# Patient Record
Sex: Female | Born: 1940 | Race: White | Hispanic: No | Marital: Married | Smoking: Never smoker
Health system: Southern US, Community
[De-identification: ages and names within clinical notes are randomized; demographics above are authoritative.]

## PROBLEM LIST (undated history)

## (undated) DIAGNOSIS — J984 Other disorders of lung: Secondary | ICD-10-CM

## (undated) DIAGNOSIS — F329 Major depressive disorder, single episode, unspecified: Secondary | ICD-10-CM

## (undated) DIAGNOSIS — K5909 Other constipation: Secondary | ICD-10-CM

## (undated) DIAGNOSIS — M1990 Unspecified osteoarthritis, unspecified site: Secondary | ICD-10-CM

## (undated) DIAGNOSIS — E119 Type 2 diabetes mellitus without complications: Secondary | ICD-10-CM

## (undated) DIAGNOSIS — I471 Supraventricular tachycardia: Secondary | ICD-10-CM

## (undated) DIAGNOSIS — T7840XA Allergy, unspecified, initial encounter: Secondary | ICD-10-CM

## (undated) DIAGNOSIS — R931 Abnormal findings on diagnostic imaging of heart and coronary circulation: Secondary | ICD-10-CM

## (undated) DIAGNOSIS — Z853 Personal history of malignant neoplasm of breast: Secondary | ICD-10-CM

## (undated) DIAGNOSIS — F32A Depression, unspecified: Secondary | ICD-10-CM

## (undated) DIAGNOSIS — I10 Essential (primary) hypertension: Secondary | ICD-10-CM

## (undated) DIAGNOSIS — M069 Rheumatoid arthritis, unspecified: Secondary | ICD-10-CM

## (undated) DIAGNOSIS — I4719 Other supraventricular tachycardia: Secondary | ICD-10-CM

## (undated) DIAGNOSIS — G20A1 Parkinson's disease without dyskinesia, without mention of fluctuations: Secondary | ICD-10-CM

## (undated) DIAGNOSIS — E785 Hyperlipidemia, unspecified: Secondary | ICD-10-CM

## (undated) HISTORY — DX: Other supraventricular tachycardia: I47.19

## (undated) HISTORY — PX: ACHILLES TENDON REPAIR: SUR1153

## (undated) HISTORY — PX: TONSILLECTOMY AND ADENOIDECTOMY: SHX28

## (undated) HISTORY — PX: ABDOMINAL HYSTERECTOMY: SHX81

## (undated) HISTORY — DX: Other disorders of lung: J98.4

## (undated) HISTORY — DX: Rheumatoid arthritis, unspecified: M06.9

## (undated) HISTORY — DX: Supraventricular tachycardia: I47.1

## (undated) HISTORY — DX: Allergy, unspecified, initial encounter: T78.40XA

## (undated) HISTORY — PX: CATARACT EXTRACTION: SUR2

## (undated) HISTORY — DX: Other constipation: K59.09

## (undated) HISTORY — DX: Unspecified osteoarthritis, unspecified site: M19.90

## (undated) HISTORY — DX: Essential (primary) hypertension: I10

## (undated) HISTORY — PX: OTHER SURGICAL HISTORY: SHX169

## (undated) HISTORY — DX: Type 2 diabetes mellitus without complications: E11.9

## (undated) HISTORY — DX: Depression, unspecified: F32.A

## (undated) HISTORY — DX: Abnormal findings on diagnostic imaging of heart and coronary circulation: R93.1

## (undated) HISTORY — DX: Personal history of malignant neoplasm of breast: Z85.3

## (undated) HISTORY — PX: BREAST REDUCTION SURGERY: SHX8

## (undated) HISTORY — DX: Hyperlipidemia, unspecified: E78.5

---
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
## 1999-01-12 ENCOUNTER — Other Ambulatory Visit: Admission: RE | Admit: 1999-01-12 | Discharge: 1999-01-12 | Payer: Self-pay | Admitting: Internal Medicine

## 2000-08-17 ENCOUNTER — Other Ambulatory Visit: Admission: RE | Admit: 2000-08-17 | Discharge: 2000-08-17 | Payer: Self-pay | Admitting: Plastic Surgery

## 2004-11-23 ENCOUNTER — Encounter: Admission: RE | Admit: 2004-11-23 | Discharge: 2005-02-16 | Payer: Self-pay

## 2006-06-15 ENCOUNTER — Ambulatory Visit (HOSPITAL_COMMUNITY): Admission: RE | Admit: 2006-06-15 | Discharge: 2006-06-15 | Payer: Self-pay | Admitting: *Deleted

## 2010-12-25 NOTE — Op Note (Signed)
NAMEJANNINE, Christy [name]               ACCOUNT NO.:  [id number]   MEDICAL RECORD NO.:  [id number]          PATIENT TYPE:  AMB   LOCATION:  ENDO                         FACILITY:  [facility]   PHYSICIAN:  Georgiana Spinner, M.D.    DATE OF BIRTH:  Jan 20, 1941   DATE OF PROCEDURE:  06/15/2006  DATE OF DISCHARGE:                                 OPERATIVE REPORT   PROCEDURE:  Colonoscopy.   INDICATIONS:  Rectal bleeding.   ANESTHESIA:  Demerol 100 mg and Versed 10 mg.   PROCEDURE:  With the patient mildly sedated in the left lateral decubitus  position, the Olympus videoscopic colonoscope was inserted in the rectum and  passed under direct vision with pressure applied and the patient rolled to  her back and subsequently to the right lateral decubitus position.  We  reached the cecum identified by ileocecal valve and appendiceal orifice both  of which were photographed.  From this point the colonoscope was slowly  withdrawn taking circumferential views of colonic mucosa stopping only in  the rectum which appeared normal.  The rectum showed hemorrhoids on  retroflex view.  The endoscope was straightened and withdrawn.  The  patient's vital signs and pulse oximeter remained stable.  The patient  tolerated the procedure well without apparent complications.   FINDINGS:  1. Scattered diverticulosis throughout the colon, both right and left      colon.  2. Internal hemorrhoids.  3. Otherwise an unremarkable colonoscopic examination to the cecum.   PLAN:  Consider repeat examination in 5-10 years           ______________________________  Georgiana Spinner, M.D.     GMO/MEDQ  D:  06/15/2006  T:  06/15/2006  Job:  [id number]

## 2011-03-22 ENCOUNTER — Encounter: Payer: Medicare Other | Attending: Internal Medicine | Admitting: Dietician

## 2011-03-22 ENCOUNTER — Encounter: Payer: Self-pay | Admitting: Dietician

## 2011-03-22 DIAGNOSIS — Z713 Dietary counseling and surveillance: Secondary | ICD-10-CM | POA: Insufficient documentation

## 2011-03-22 DIAGNOSIS — E119 Type 2 diabetes mellitus without complications: Secondary | ICD-10-CM | POA: Insufficient documentation

## 2011-03-22 NOTE — Patient Instructions (Signed)
   Check your feet daily and make sure they are healthy.  B:  Make sure you have 14 gm of protein   Keep the carb level at 20-30 gm at breakfast.   L=14 gm protein, keep the carb at 30 gms.  Afternoon snack keep protein at 7-14 gm protein and 15 gms of carb.    If you continue to have low blood glucose levels and need to treat, call Judie Grieve and see about taking the Metformin dose down to 250 mg instead of 500 mg.   Aim for bedtime blood glucose level of 100-120. Marland Kitchen  Dinner carb at 30 gm level and protein 21+ gms at dinner.    Bedtime snack carb at 15 gms and 14 gm protein   Keep a glucose diary and plot the glucose levels and the lows.    If having issues/questions, call at [phone] [facility] or e-mail)  See my card.

## 2011-03-22 NOTE — Progress Notes (Signed)
  Medical Nutrition Therapy:  Appt start time: 1600 end time:  1700.   Assessment:  Primary concerns today: Concerned with the episodes of low blood glucose and wishes to loose more weight and to keep it off.  Currently on Bydureon and Metformin.  Metformin dose was decreased to 500 mg PC breakfast and dinner.  Continues to have lows and needs to have the afternoon snack.  Krishan Mcbreen be able to cut the AM dose of Metformin to 250 mg. Has over time lost about 30 lbs.  Has gained 7 of those back.  Gives a history of gluten intolerance.  Generally avoids gluten.  When eaten, the gluten causes her feet to swell.  MEDICATIONS: Bydureon, Metformin, Methotrexate, Folic Acid One-A-Day vitamins, Ultimate Omega , Vitamin D 3, Calcium, Selenium, Zyflamend 1-2 caps daily for inflammation of the arthritis, Methyl-B 12, Psyllium.   DIETARY INTAKE:  Usual eating pattern includes 3 meals and 2 snacks per day.  Everyday foods include: Protein, fruit, vegetable .  Avoided foods include: foods containing gluten.    24-hr recall:  B (100 AM): [place] yogurt 1/2 cup (no sugar added), raspberries (handful), granola 2-3 TBSP.  Snk ( AM): No AM Snack. Carb= about 30 gm. L (12:00-1:00 PM): Whey Protein Shake (Protein Powder in 8 oz of skim milk with 1/2 banana and 6 frozen strawberries. Carb=approx. 34 gm. Snk (3:00-3:30 PM): 3 fig bar cookies, 8 oz skim milk. Carb = approx. 42-45 gm. D (6:00-6:30PM): Chicken burger, lettuce, tomato, mayo on whole grain bun, Zucchini with onion, sliced cucumbers, Iced tea (unsweetened)= approx. 30-45 gm. Snk (9>00PM): 1/2 cup frozen [place] yogurt with almonds Carb= approx. 15 gm.  Usual physical activity: Aerobic tape using weights 3 days per week for 30-40 minutes.  Tue. And Thur. To the [facility] for 60 minutes of water aerobics.  On Saturday, Candon Caras ride bike, or walk in the neighborhood for 30-45 minutes.  Estimated energy needs: 1300  calories 130-135 g carbohydrates 110-115 g protein 34-36 g  fat  Progress Towards Goal(s):  In progress.   Nutritional Diagnosis:  [place]-3.3 Overweight/obesity As related to weight of 170 lbs for height of 64 inches..  As evidenced by a BMI of 29.2%..    Intervention:  Nutrition : To keep a food diary, counting calories and carbohydrates.  To aim for 1300 calories per day and not less than 1200/day.  To count carbohydrates.  Try to keep at 30 gm per meal and 15 gm for the afternoon and evening snack.  To keep a close watch on blood glucose levels as well.  Zayde Stroupe need to decrease the AM dose of Metformin as she is complaining that the lunch is not holding her and she needs the afternoon snack.  To call or e-mail if problems occur.  To follow-up in 8 weeks.  Monitoring/Evaluation:  Dietary intake, exercise, blood glucose monitoring  and body weight and calorie, carbohydrate diary and follow-up in 8 weeks.Marland Kitchen

## 2016-05-06 LAB — HM DEXA SCAN

## 2017-12-01 LAB — HEPATIC FUNCTION PANEL
ALT: 10 (ref 7–35)
AST: 12 — AB (ref 13–35)
Alkaline Phosphatase: 70 (ref 25–125)
Bilirubin, Total: 0.3

## 2017-12-01 LAB — HEMOGLOBIN A1C: Hemoglobin A1C: 6.2

## 2017-12-02 LAB — BASIC METABOLIC PANEL
BUN: 20 (ref 4–21)
Creatinine: 1.1 (ref 0.5–1.1)
Glucose: 106
Potassium: 4.2 (ref 3.4–5.3)
Sodium: 142 (ref 137–147)

## 2018-11-01 ENCOUNTER — Ambulatory Visit: Payer: BC Managed Care – PPO | Admitting: Family Medicine

## 2019-01-31 ENCOUNTER — Ambulatory Visit: Payer: BC Managed Care – PPO | Admitting: Family Medicine

## 2019-02-21 ENCOUNTER — Telehealth: Payer: Self-pay

## 2019-02-21 NOTE — Telephone Encounter (Signed)
Questions for Screening COVID-19  Symptom onset: no  Travel or Contacts: n/a  During this illness, did/does the patient experience any of the following symptoms? Fever >100.22F []   Yes [x]   No []   Unknown Subjective fever (felt feverish) []   Yes [x]   No []   Unknown Chills []   Yes [x]   No []   Unknown Muscle aches (myalgia) []   Yes [x]   No []   Unknown Runny nose (rhinorrhea) []   Yes [x]   No []   Unknown Sore throat []   Yes [x]   No []   Unknown Cough (new onset or worsening of chronic cough) []   Yes [x]   No []   Unknown Shortness of breath (dyspnea) []   Yes [x]   No []   Unknown Nausea or vomiting []   Yes [x]   No []   Unknown Headache []   Yes [x]   No []   Unknown Abdominal pain  []   Yes [x]   No []   Unknown Diarrhea (?3 loose/looser than normal stools/24hr period) []   Yes [x]   No []   Unknown Other, specify:  Patient risk factors: Smoker? []   Current []   Former []   Never If female, currently pregnant? []   Yes []   No  There are no active problems to display for this patient.   Plan:  []   High risk for COVID-19 with red flags go to ED (with CP, SOB, weak/lightheaded, or fever > 101.5). Call ahead.  []   High risk for COVID-19 but stable. Inform provider and coordinate time for [facility] visit.   []   No red flags but URI signs or symptoms okay for [facility] visit.

## 2019-02-22 ENCOUNTER — Ambulatory Visit (INDEPENDENT_AMBULATORY_CARE_PROVIDER_SITE_OTHER): Payer: Medicare Other | Admitting: Family Medicine

## 2019-02-22 ENCOUNTER — Encounter: Payer: Self-pay | Admitting: Family Medicine

## 2019-02-22 VITALS — BP 156/72 | HR 87 | Temp 98.7°F | Ht 64.0 in | Wt 179.2 lb

## 2019-02-22 DIAGNOSIS — L299 Pruritus, unspecified: Secondary | ICD-10-CM

## 2019-02-22 DIAGNOSIS — Z7689 Persons encountering health services in other specified circumstances: Secondary | ICD-10-CM

## 2019-02-22 DIAGNOSIS — G479 Sleep disorder, unspecified: Secondary | ICD-10-CM

## 2019-02-22 NOTE — Patient Instructions (Addendum)
Amitriptyline  Melatonin 5-10mg  30-86min prior to bedtime and take consistently  Stop protein shake Consider daily zyrtec 1 tab x 2-4 wks

## 2019-02-22 NOTE — Progress Notes (Signed)
Christy [name] is a 78 y.o. female  Chief Complaint  Patient presents with  . Establish Care    est care/ having sleep issues/ pt states she is itching mainly arms and neck and feels flushed/ wants to talk about supplements see if corresponding to itching/ diet / pt getting records for shots.    HPI: Christy [name] is a 78 y.o. female to establish care with our office. She has 2 grown sons and a 12yo grandson. She is married (2nd marriage) x 10 years. She is a retired Pharmacist, community.   Specialists: Rheum (Dr. Mallie Mussel), ophthalmology (Dr. Hazle Quant)  Last CPE, labs: 05/2018  Last PAP: s/p TAH Last mammo: 01/2019 Last Dexa: 2-3 years ago - osteopenia Last colonoscopy: 2012 - due in 2020  She notes issues with sleep x a few years. She states she wakes up every few hours.  She tried [place] in the past but stopped d/t side effects - too drowsy in AM. She also tried tramadol but did not feel it was effective. She is taking melatonin on/off x >6-47mo but consistently. She is unsure of dosage but thinks it is 2mg .   She complains of itching on/off worse in the AM but still present as day goes on. Symptoms x 4-5 mo. Pt states she feels "flushed" and then itchy. Mostly arms and neck. No rash. She does have seasonal allergies.  No new meds, supplements, lotions, detergents, etc. She started a diet called 56 Days in late 09/2018 and is drinking a protein shake with "lots of ingredients". She states the timing coincides with start of above symptoms. She has not tried taking anything.   Past Medical History:  Diagnosis Date  . Arthritis     History reviewed. No pertinent surgical history.  Social History   Socioeconomic History  . Marital status: Divorced    Spouse name: Not on file  . Number of children: Not on file  . Years of education: Not on file  . Highest education level: Not on file  Occupational History  . Not on file  Social Needs  . Financial resource strain: Not on file   . Food insecurity    Worry: Not on file    Inability: Not on file  . Transportation needs    Medical: Not on file    Non-medical: Not on file  Tobacco Use  . Smoking status: Never Smoker  . Smokeless tobacco: Never Used  Substance and Sexual Activity  . Alcohol use: Not Currently  . Drug use: Not Currently  . Sexual activity: Not on file  Lifestyle  . Physical activity    Days per week: Not on file    Minutes per session: Not on file  . Stress: Not on file  Relationships  . Social 10/2018 on phone: Not on file    Gets together: Not on file    Attends religious service: Not on file    Active member of club or organization: Not on file    Attends meetings of clubs or organizations: Not on file    Relationship status: Not on file  . Intimate partner violence    Fear of current or ex partner: Not on file    Emotionally abused: Not on file    Physically abused: Not on file    Forced sexual activity: Not on file  Other Topics Concern  . Not on file  Social History Narrative  . Not on file  History reviewed. No pertinent family history.   Immunization History  Administered Date(s) Administered  . Influenza Split 06/13/2015  . Pneumococcal Polysaccharide-23 06/01/2006    Outpatient Encounter Medications as of 02/22/2019  Medication Sig  . Calcium Citrate 150 MG CAPS Take 1,000 mg by mouth daily.    . Cholecalciferol (VITAMIN D) 1000 UNITS capsule Take 1,000 Units by mouth daily.    . Cyanocobalamin (VITAMIN B 12 PO) Take 1,000 mcg by mouth daily.    . Exenatide (BYDUREON) 2 MG SUSR Inject 2 mg into the skin once a week.    . folic acid (FOLVITE) 1 MG tablet Take 1 mg by mouth daily.    . metFORMIN (GLUCOPHAGE) 500 MG tablet Take 500 mg by mouth 2 (two) times daily with a meal.    . methotrexate (RHEUMATREX) 2.5 MG tablet Take 2.5 mg by mouth. Caution:Chemotherapy. Protect from light.   . MULTIPLE VITAMIN PO Take 1 tablet by mouth daily.    . PSYLLIUM PO  Take 8-10 capsules by mouth daily.    . Selenium 200 MCG CAPS Take 200 mcg by mouth daily.    . TRULICITY 0.09 FG/1.8EX SOPN INJ 0.75 MG [place] Q WK   No facility-administered encounter medications on file as of 02/22/2019.      ROS: Pertinent positives and negatives noted in HPI. Remainder of ROS non-contributory  Allergies  Allergen Reactions  . Epinephrine   . Erythromycin   . Gluten Swelling    Feet will swell.  Marland Kitchen Penicillins   . Tetracyclines & Related     BP (!) 156/72   Pulse 87   Temp 98.7 F (37.1 C) (Oral)   Ht 5\' 4"  (1.626 m)   Wt 179 lb 3.2 oz (81.3 kg)   SpO2 97%   BMI 30.76 kg/m   BP Readings from Last 3 Encounters:  02/22/19 (!) 156/72    Physical Exam  Constitutional: She is oriented to person, place, and time. She appears well-developed and well-nourished. No distress.  Neck: Neck supple.  Cardiovascular: Normal rate and regular rhythm.  Pulmonary/Chest: Effort normal and breath sounds normal.  Musculoskeletal: Normal range of motion.        General: No edema.  Lymphadenopathy:    She has no cervical adenopathy.  Neurological: She is alert and oriented to person, place, and time.  Skin: Skin is warm and dry. No rash noted. No erythema.  Psychiatric: She has a normal mood and affect. Her behavior is normal.     A/P:  1. Encounter to establish care with new doctor - due in 05/2019 for CPE, labs - due for Dexa but pt declines referral today  2. Sleep disturbance - pt has tried OTC meds as well as trazodone, ambien - currently taking melatonin 2mg  PRN but does not feel it is effective. Recommended pt take 5mg  tab qHS consistently x 2-4 wks - consider amitriptyline if no improvement with melatonin   3. Pruritus - worse in AM, symptoms x 4-37mo - onset of symptoms correlate with pt starting new daily protein powder/shake as part of a weight loss program. Advised pt to stop this to see if symptoms improve. - if no improvement with stopping of protein  shake, pt will take zyrtec 1 tab daily x 2-4 wks - f/u if no improvement with above and will check labs including LFTs

## 2019-02-23 ENCOUNTER — Encounter: Payer: Self-pay | Admitting: Family Medicine

## 2019-02-26 LAB — LIPID PANEL
Cholesterol: 184 (ref 0–200)
HDL: 80 — AB (ref 35–70)
LDL Cholesterol: 84
Triglycerides: 100 (ref 40–160)

## 2019-02-26 LAB — CBC AND DIFFERENTIAL
HCT: 34 — AB (ref 36–46)
Hemoglobin: 11.6 — AB (ref 12.0–16.0)
Neutrophils Absolute: 4
Platelets: 268 (ref 150–399)
WBC: 6.9

## 2019-02-26 LAB — BASIC METABOLIC PANEL
Glucose: 121
Potassium: 4.1 (ref 3.4–5.3)
Sodium: 140 (ref 137–147)

## 2019-02-26 LAB — HEPATIC FUNCTION PANEL
ALT: 12 (ref 7–35)
AST: 14 (ref 13–35)

## 2019-02-26 LAB — HEMOGLOBIN A1C: Hemoglobin A1C: 6.8

## 2019-02-26 LAB — VITAMIN D 25 HYDROXY (VIT D DEFICIENCY, FRACTURES): Vit D, 25-Hydroxy: 53.7

## 2019-03-06 ENCOUNTER — Encounter: Payer: Self-pay | Admitting: Family Medicine

## 2019-06-12 ENCOUNTER — Ambulatory Visit: Payer: Medicare Other | Admitting: Psychology

## 2019-06-21 ENCOUNTER — Telehealth: Payer: Self-pay

## 2019-06-21 NOTE — Telephone Encounter (Signed)
Copied from [place] [id number]. Topic: Appointment Scheduling - Scheduling Inquiry for Clinic >> Jun 21, 2019  1:53 PM Christy [name] wrote: Patient insisted we send message in regards to her wanting to transfer her care to Dr. Deborra Medina. Please advise

## 2019-07-11 ENCOUNTER — Ambulatory Visit: Payer: TRICARE For Life (TFL) | Admitting: Family Medicine

## 2019-08-29 ENCOUNTER — Ambulatory Visit: Payer: Medicare PPO | Attending: Internal Medicine

## 2019-08-29 DIAGNOSIS — Z23 Encounter for immunization: Secondary | ICD-10-CM | POA: Insufficient documentation

## 2019-09-13 ENCOUNTER — Telehealth: Payer: Self-pay | Admitting: Family Medicine

## 2019-09-13 NOTE — Telephone Encounter (Signed)
Patient called to reschedule her COVID-19 immunization.  She developed a rash and spoke with pfizer. They recommended delay of second dose 1 week. Appointment was rescheduled.  She was cautioned that this appointment could change due to availability of vaccine.

## 2019-09-17 ENCOUNTER — Ambulatory Visit: Payer: Self-pay

## 2019-09-25 ENCOUNTER — Ambulatory Visit: Payer: Medicare PPO

## 2019-09-28 ENCOUNTER — Ambulatory Visit: Payer: Self-pay

## 2019-09-30 ENCOUNTER — Ambulatory Visit: Payer: Medicare PPO | Attending: Internal Medicine

## 2019-09-30 DIAGNOSIS — Z23 Encounter for immunization: Secondary | ICD-10-CM | POA: Insufficient documentation

## 2019-09-30 NOTE — Progress Notes (Signed)
   Covid-19 Vaccination Clinic  Name:  Christy [name]    MRN: [id number] DOB: 02-14-1941  09/30/2019  Christy [name] was observed post Covid-19 immunization for 30 minutes based on pre-vaccination screening without incidence. She was provided with Vaccine Information Sheet and instruction to access the V-Safe system.   Christy [name] was instructed to call 911 with any severe reactions post vaccine: Marland Kitchen Difficulty breathing  . Swelling of your face and throat  . A fast heartbeat  . A bad rash all over your body  . Dizziness and weakness    Immunizations Administered    Name Date Dose VIS Date Route   Pfizer COVID-19 Vaccine 09/30/2019  9:50 AM 0.3 mL 07/20/2019 Intramuscular   Manufacturer: [employer], [employer]   Lot: [id number]   NDC: [address]-0

## 2019-10-17 DIAGNOSIS — R011 Cardiac murmur, unspecified: Secondary | ICD-10-CM | POA: Diagnosis not present

## 2019-10-18 DIAGNOSIS — E559 Vitamin D deficiency, unspecified: Secondary | ICD-10-CM | POA: Diagnosis not present

## 2019-10-18 DIAGNOSIS — I10 Essential (primary) hypertension: Secondary | ICD-10-CM | POA: Diagnosis not present

## 2019-10-18 DIAGNOSIS — Z1159 Encounter for screening for other viral diseases: Secondary | ICD-10-CM | POA: Diagnosis not present

## 2019-10-18 DIAGNOSIS — E7800 Pure hypercholesterolemia, unspecified: Secondary | ICD-10-CM | POA: Diagnosis not present

## 2019-10-18 DIAGNOSIS — R011 Cardiac murmur, unspecified: Secondary | ICD-10-CM | POA: Diagnosis not present

## 2019-11-01 DIAGNOSIS — E118 Type 2 diabetes mellitus with unspecified complications: Secondary | ICD-10-CM | POA: Diagnosis not present

## 2019-11-01 DIAGNOSIS — H9193 Unspecified hearing loss, bilateral: Secondary | ICD-10-CM | POA: Diagnosis not present

## 2019-11-01 DIAGNOSIS — R011 Cardiac murmur, unspecified: Secondary | ICD-10-CM | POA: Diagnosis not present

## 2019-11-01 DIAGNOSIS — R030 Elevated blood-pressure reading, without diagnosis of hypertension: Secondary | ICD-10-CM | POA: Diagnosis not present

## 2019-11-01 DIAGNOSIS — K5909 Other constipation: Secondary | ICD-10-CM | POA: Diagnosis not present

## 2019-11-01 DIAGNOSIS — Z0000 Encounter for general adult medical examination without abnormal findings: Secondary | ICD-10-CM | POA: Diagnosis not present

## 2019-11-01 DIAGNOSIS — M069 Rheumatoid arthritis, unspecified: Secondary | ICD-10-CM | POA: Diagnosis not present

## 2019-11-01 DIAGNOSIS — F419 Anxiety disorder, unspecified: Secondary | ICD-10-CM | POA: Diagnosis not present

## 2019-11-01 DIAGNOSIS — Z6830 Body mass index (BMI) 30.0-30.9, adult: Secondary | ICD-10-CM | POA: Diagnosis not present

## 2019-11-26 DIAGNOSIS — Z6830 Body mass index (BMI) 30.0-30.9, adult: Secondary | ICD-10-CM | POA: Diagnosis not present

## 2019-11-26 DIAGNOSIS — E559 Vitamin D deficiency, unspecified: Secondary | ICD-10-CM | POA: Diagnosis not present

## 2019-11-26 DIAGNOSIS — M150 Primary generalized (osteo)arthritis: Secondary | ICD-10-CM | POA: Diagnosis not present

## 2019-11-26 DIAGNOSIS — M0579 Rheumatoid arthritis with rheumatoid factor of multiple sites without organ or systems involvement: Secondary | ICD-10-CM | POA: Diagnosis not present

## 2019-12-24 DIAGNOSIS — H903 Sensorineural hearing loss, bilateral: Secondary | ICD-10-CM | POA: Diagnosis not present

## 2019-12-24 DIAGNOSIS — H6123 Impacted cerumen, bilateral: Secondary | ICD-10-CM | POA: Diagnosis not present

## 2019-12-24 DIAGNOSIS — H9313 Tinnitus, bilateral: Secondary | ICD-10-CM | POA: Diagnosis not present

## 2019-12-27 DIAGNOSIS — E669 Obesity, unspecified: Secondary | ICD-10-CM | POA: Diagnosis not present

## 2019-12-27 DIAGNOSIS — E118 Type 2 diabetes mellitus with unspecified complications: Secondary | ICD-10-CM | POA: Diagnosis not present

## 2019-12-27 DIAGNOSIS — Z853 Personal history of malignant neoplasm of breast: Secondary | ICD-10-CM | POA: Diagnosis not present

## 2019-12-27 DIAGNOSIS — E7800 Pure hypercholesterolemia, unspecified: Secondary | ICD-10-CM | POA: Diagnosis not present

## 2020-01-10 DIAGNOSIS — Z961 Presence of intraocular lens: Secondary | ICD-10-CM | POA: Diagnosis not present

## 2020-01-10 DIAGNOSIS — H1840 Unspecified corneal degeneration: Secondary | ICD-10-CM | POA: Diagnosis not present

## 2020-01-10 DIAGNOSIS — H52213 Irregular astigmatism, bilateral: Secondary | ICD-10-CM | POA: Diagnosis not present

## 2020-01-10 DIAGNOSIS — H0100A Unspecified blepharitis right eye, upper and lower eyelids: Secondary | ICD-10-CM | POA: Diagnosis not present

## 2020-01-21 DIAGNOSIS — Z1231 Encounter for screening mammogram for malignant neoplasm of breast: Secondary | ICD-10-CM | POA: Diagnosis not present

## 2020-01-25 DIAGNOSIS — R351 Nocturia: Secondary | ICD-10-CM | POA: Diagnosis not present

## 2020-01-25 DIAGNOSIS — F419 Anxiety disorder, unspecified: Secondary | ICD-10-CM | POA: Diagnosis not present

## 2020-01-25 DIAGNOSIS — G4721 Circadian rhythm sleep disorder, delayed sleep phase type: Secondary | ICD-10-CM | POA: Diagnosis not present

## 2020-02-14 DIAGNOSIS — Z13220 Encounter for screening for lipoid disorders: Secondary | ICD-10-CM | POA: Diagnosis not present

## 2020-02-14 DIAGNOSIS — E119 Type 2 diabetes mellitus without complications: Secondary | ICD-10-CM | POA: Diagnosis not present

## 2020-02-14 DIAGNOSIS — M0579 Rheumatoid arthritis with rheumatoid factor of multiple sites without organ or systems involvement: Secondary | ICD-10-CM | POA: Diagnosis not present

## 2020-03-14 DIAGNOSIS — R351 Nocturia: Secondary | ICD-10-CM | POA: Diagnosis not present

## 2020-03-14 DIAGNOSIS — R3915 Urgency of urination: Secondary | ICD-10-CM | POA: Diagnosis not present

## 2020-03-18 DIAGNOSIS — M6281 Muscle weakness (generalized): Secondary | ICD-10-CM | POA: Diagnosis not present

## 2020-03-18 DIAGNOSIS — R3915 Urgency of urination: Secondary | ICD-10-CM | POA: Diagnosis not present

## 2020-03-18 DIAGNOSIS — R351 Nocturia: Secondary | ICD-10-CM | POA: Diagnosis not present

## 2020-03-18 DIAGNOSIS — F419 Anxiety disorder, unspecified: Secondary | ICD-10-CM | POA: Diagnosis not present

## 2020-05-19 DIAGNOSIS — B351 Tinea unguium: Secondary | ICD-10-CM | POA: Diagnosis not present

## 2020-05-19 DIAGNOSIS — L600 Ingrowing nail: Secondary | ICD-10-CM | POA: Diagnosis not present

## 2020-05-19 DIAGNOSIS — M79674 Pain in right toe(s): Secondary | ICD-10-CM | POA: Diagnosis not present

## 2020-05-19 DIAGNOSIS — E1142 Type 2 diabetes mellitus with diabetic polyneuropathy: Secondary | ICD-10-CM | POA: Diagnosis not present

## 2020-05-26 DIAGNOSIS — H524 Presbyopia: Secondary | ICD-10-CM | POA: Diagnosis not present

## 2020-05-26 DIAGNOSIS — H26493 Other secondary cataract, bilateral: Secondary | ICD-10-CM | POA: Diagnosis not present

## 2020-05-26 DIAGNOSIS — H52213 Irregular astigmatism, bilateral: Secondary | ICD-10-CM | POA: Diagnosis not present

## 2020-05-26 DIAGNOSIS — E119 Type 2 diabetes mellitus without complications: Secondary | ICD-10-CM | POA: Diagnosis not present

## 2020-05-26 DIAGNOSIS — Z961 Presence of intraocular lens: Secondary | ICD-10-CM | POA: Diagnosis not present

## 2020-05-26 DIAGNOSIS — H0100A Unspecified blepharitis right eye, upper and lower eyelids: Secondary | ICD-10-CM | POA: Diagnosis not present

## 2020-06-05 DIAGNOSIS — F32A Depression, unspecified: Secondary | ICD-10-CM | POA: Diagnosis not present

## 2020-06-17 DIAGNOSIS — Z6831 Body mass index (BMI) 31.0-31.9, adult: Secondary | ICD-10-CM | POA: Diagnosis not present

## 2020-06-17 DIAGNOSIS — E559 Vitamin D deficiency, unspecified: Secondary | ICD-10-CM | POA: Diagnosis not present

## 2020-06-17 DIAGNOSIS — M0579 Rheumatoid arthritis with rheumatoid factor of multiple sites without organ or systems involvement: Secondary | ICD-10-CM | POA: Diagnosis not present

## 2020-06-17 DIAGNOSIS — M150 Primary generalized (osteo)arthritis: Secondary | ICD-10-CM | POA: Diagnosis not present

## 2020-06-17 DIAGNOSIS — E669 Obesity, unspecified: Secondary | ICD-10-CM | POA: Diagnosis not present

## 2020-07-11 DIAGNOSIS — F32A Depression, unspecified: Secondary | ICD-10-CM | POA: Diagnosis not present

## 2020-10-07 DIAGNOSIS — M0579 Rheumatoid arthritis with rheumatoid factor of multiple sites without organ or systems involvement: Secondary | ICD-10-CM | POA: Diagnosis not present

## 2020-11-06 DIAGNOSIS — I951 Orthostatic hypotension: Secondary | ICD-10-CM | POA: Diagnosis not present

## 2020-11-06 DIAGNOSIS — M069 Rheumatoid arthritis, unspecified: Secondary | ICD-10-CM | POA: Diagnosis not present

## 2020-11-06 DIAGNOSIS — E118 Type 2 diabetes mellitus with unspecified complications: Secondary | ICD-10-CM | POA: Diagnosis not present

## 2020-11-06 DIAGNOSIS — R42 Dizziness and giddiness: Secondary | ICD-10-CM | POA: Diagnosis not present

## 2020-11-06 DIAGNOSIS — E7800 Pure hypercholesterolemia, unspecified: Secondary | ICD-10-CM | POA: Diagnosis not present

## 2020-11-06 DIAGNOSIS — F5104 Psychophysiologic insomnia: Secondary | ICD-10-CM | POA: Diagnosis not present

## 2020-11-06 DIAGNOSIS — Z0000 Encounter for general adult medical examination without abnormal findings: Secondary | ICD-10-CM | POA: Diagnosis not present

## 2020-11-18 DIAGNOSIS — H903 Sensorineural hearing loss, bilateral: Secondary | ICD-10-CM | POA: Diagnosis not present

## 2020-11-25 DIAGNOSIS — E1165 Type 2 diabetes mellitus with hyperglycemia: Secondary | ICD-10-CM | POA: Diagnosis not present

## 2020-11-28 ENCOUNTER — Other Ambulatory Visit: Payer: Self-pay | Admitting: Internal Medicine

## 2020-11-28 DIAGNOSIS — E7800 Pure hypercholesterolemia, unspecified: Secondary | ICD-10-CM

## 2020-12-03 DIAGNOSIS — Z1211 Encounter for screening for malignant neoplasm of colon: Secondary | ICD-10-CM | POA: Diagnosis not present

## 2020-12-03 DIAGNOSIS — Z1212 Encounter for screening for malignant neoplasm of rectum: Secondary | ICD-10-CM | POA: Diagnosis not present

## 2020-12-04 LAB — COLOGUARD: Cologuard: POSITIVE — AB

## 2020-12-12 LAB — COLOGUARD: COLOGUARD: POSITIVE — AB

## 2020-12-16 DIAGNOSIS — E559 Vitamin D deficiency, unspecified: Secondary | ICD-10-CM | POA: Diagnosis not present

## 2020-12-16 DIAGNOSIS — M150 Primary generalized (osteo)arthritis: Secondary | ICD-10-CM | POA: Diagnosis not present

## 2020-12-16 DIAGNOSIS — M0579 Rheumatoid arthritis with rheumatoid factor of multiple sites without organ or systems involvement: Secondary | ICD-10-CM | POA: Diagnosis not present

## 2020-12-16 DIAGNOSIS — E669 Obesity, unspecified: Secondary | ICD-10-CM | POA: Diagnosis not present

## 2020-12-16 DIAGNOSIS — Z6830 Body mass index (BMI) 30.0-30.9, adult: Secondary | ICD-10-CM | POA: Diagnosis not present

## 2020-12-23 DIAGNOSIS — R195 Other fecal abnormalities: Secondary | ICD-10-CM | POA: Diagnosis not present

## 2020-12-23 DIAGNOSIS — K5900 Constipation, unspecified: Secondary | ICD-10-CM | POA: Diagnosis not present

## 2020-12-30 DIAGNOSIS — R030 Elevated blood-pressure reading, without diagnosis of hypertension: Secondary | ICD-10-CM | POA: Diagnosis not present

## 2020-12-30 DIAGNOSIS — E118 Type 2 diabetes mellitus with unspecified complications: Secondary | ICD-10-CM | POA: Diagnosis not present

## 2021-01-13 DIAGNOSIS — R195 Other fecal abnormalities: Secondary | ICD-10-CM | POA: Diagnosis not present

## 2021-01-13 DIAGNOSIS — D120 Benign neoplasm of cecum: Secondary | ICD-10-CM | POA: Diagnosis not present

## 2021-01-13 DIAGNOSIS — D123 Benign neoplasm of transverse colon: Secondary | ICD-10-CM | POA: Diagnosis not present

## 2021-01-13 DIAGNOSIS — K5730 Diverticulosis of large intestine without perforation or abscess without bleeding: Secondary | ICD-10-CM | POA: Diagnosis not present

## 2021-01-16 DIAGNOSIS — D120 Benign neoplasm of cecum: Secondary | ICD-10-CM | POA: Diagnosis not present

## 2021-01-16 DIAGNOSIS — D123 Benign neoplasm of transverse colon: Secondary | ICD-10-CM | POA: Diagnosis not present

## 2021-01-19 DIAGNOSIS — R42 Dizziness and giddiness: Secondary | ICD-10-CM | POA: Diagnosis not present

## 2021-01-19 DIAGNOSIS — Z79899 Other long term (current) drug therapy: Secondary | ICD-10-CM | POA: Diagnosis not present

## 2021-01-21 DIAGNOSIS — R55 Syncope and collapse: Secondary | ICD-10-CM | POA: Diagnosis not present

## 2021-01-21 DIAGNOSIS — R9431 Abnormal electrocardiogram [ECG] [EKG]: Secondary | ICD-10-CM | POA: Diagnosis not present

## 2021-01-21 DIAGNOSIS — I951 Orthostatic hypotension: Secondary | ICD-10-CM | POA: Diagnosis not present

## 2021-01-26 ENCOUNTER — Telehealth: Payer: Self-pay

## 2021-01-26 NOTE — Telephone Encounter (Signed)
Pt needs a scheduling appt. Progress notes on file sent from: [facility], phone # phone [phone]. A copy of referral order was sent to scheduling

## 2021-01-26 NOTE — Telephone Encounter (Signed)
Pt needs to be scheduled as soon as possible. Progress notes on file sent from: [facility], phone: [phone]. A copy of referral have bee sent to scheduling.

## 2021-01-29 DIAGNOSIS — R030 Elevated blood-pressure reading, without diagnosis of hypertension: Secondary | ICD-10-CM | POA: Diagnosis not present

## 2021-01-29 DIAGNOSIS — E1165 Type 2 diabetes mellitus with hyperglycemia: Secondary | ICD-10-CM | POA: Diagnosis not present

## 2021-02-03 DIAGNOSIS — R55 Syncope and collapse: Secondary | ICD-10-CM | POA: Diagnosis not present

## 2021-02-06 DIAGNOSIS — R55 Syncope and collapse: Secondary | ICD-10-CM | POA: Diagnosis not present

## 2021-02-10 ENCOUNTER — Ambulatory Visit
Admission: RE | Admit: 2021-02-10 | Discharge: 2021-02-10 | Disposition: A | Discharge status: Home / Self Care | Payer: Medicare PPO | Source: Ambulatory Visit | Attending: Internal Medicine | Admitting: Internal Medicine

## 2021-02-10 ENCOUNTER — Other Ambulatory Visit: Payer: Self-pay

## 2021-02-10 DIAGNOSIS — E7800 Pure hypercholesterolemia, unspecified: Secondary | ICD-10-CM

## 2021-02-13 ENCOUNTER — Other Ambulatory Visit: Payer: Medicare PPO

## 2021-02-24 DIAGNOSIS — E1165 Type 2 diabetes mellitus with hyperglycemia: Secondary | ICD-10-CM | POA: Diagnosis not present

## 2021-02-24 DIAGNOSIS — K5909 Other constipation: Secondary | ICD-10-CM | POA: Diagnosis not present

## 2021-03-05 DIAGNOSIS — R2681 Unsteadiness on feet: Secondary | ICD-10-CM | POA: Diagnosis not present

## 2021-03-05 DIAGNOSIS — R42 Dizziness and giddiness: Secondary | ICD-10-CM | POA: Diagnosis not present

## 2021-03-05 DIAGNOSIS — R262 Difficulty in walking, not elsewhere classified: Secondary | ICD-10-CM | POA: Diagnosis not present

## 2021-03-23 DIAGNOSIS — L600 Ingrowing nail: Secondary | ICD-10-CM | POA: Diagnosis not present

## 2021-03-26 DIAGNOSIS — M150 Primary generalized (osteo)arthritis: Secondary | ICD-10-CM | POA: Diagnosis not present

## 2021-03-26 DIAGNOSIS — M0579 Rheumatoid arthritis with rheumatoid factor of multiple sites without organ or systems involvement: Secondary | ICD-10-CM | POA: Diagnosis not present

## 2021-03-26 DIAGNOSIS — E559 Vitamin D deficiency, unspecified: Secondary | ICD-10-CM | POA: Diagnosis not present

## 2021-03-26 DIAGNOSIS — E663 Overweight: Secondary | ICD-10-CM | POA: Diagnosis not present

## 2021-03-26 DIAGNOSIS — Z6829 Body mass index (BMI) 29.0-29.9, adult: Secondary | ICD-10-CM | POA: Diagnosis not present

## 2021-03-31 DIAGNOSIS — Z1231 Encounter for screening mammogram for malignant neoplasm of breast: Secondary | ICD-10-CM | POA: Diagnosis not present

## 2021-04-07 DIAGNOSIS — E1165 Type 2 diabetes mellitus with hyperglycemia: Secondary | ICD-10-CM | POA: Diagnosis not present

## 2021-04-07 DIAGNOSIS — R030 Elevated blood-pressure reading, without diagnosis of hypertension: Secondary | ICD-10-CM | POA: Diagnosis not present

## 2021-04-14 ENCOUNTER — Other Ambulatory Visit: Payer: Self-pay

## 2021-04-14 ENCOUNTER — Encounter: Payer: Self-pay | Admitting: Cardiology

## 2021-04-14 ENCOUNTER — Ambulatory Visit (INDEPENDENT_AMBULATORY_CARE_PROVIDER_SITE_OTHER): Payer: Medicare PPO | Admitting: Cardiology

## 2021-04-14 VITALS — BP 155/74 | HR 72 | Ht 64.0 in | Wt 171.4 lb

## 2021-04-14 DIAGNOSIS — E7800 Pure hypercholesterolemia, unspecified: Secondary | ICD-10-CM

## 2021-04-14 DIAGNOSIS — E785 Hyperlipidemia, unspecified: Secondary | ICD-10-CM | POA: Insufficient documentation

## 2021-04-14 DIAGNOSIS — I4719 Other supraventricular tachycardia: Secondary | ICD-10-CM | POA: Insufficient documentation

## 2021-04-14 DIAGNOSIS — I10 Essential (primary) hypertension: Secondary | ICD-10-CM

## 2021-04-14 DIAGNOSIS — I471 Supraventricular tachycardia: Secondary | ICD-10-CM

## 2021-04-14 DIAGNOSIS — R931 Abnormal findings on diagnostic imaging of heart and coronary circulation: Secondary | ICD-10-CM

## 2021-04-14 DIAGNOSIS — R55 Syncope and collapse: Secondary | ICD-10-CM

## 2021-04-14 NOTE — Patient Instructions (Signed)
Medication Instructions:  Your physician recommends that you continue on your current medications as directed. Please refer to the Current Medication list given to you today.  *If you need a refill on your cardiac medications before your next appointment, please call your pharmacy*   Lab Work: Fasting lipids and ALT If you have labs (blood work) drawn today and your tests are completely normal, you will receive your results only by: MyChart Message (if you have MyChart) OR A paper copy in the mail If you have any lab test that is abnormal or we need to change your treatment, we will call you to review the results.   Testing/Procedures: Your physician has requested that you have an echocardiogram. Echocardiography is a painless test that uses sound waves to create images of your heart. It provides your doctor with information about the size and shape of your heart and how well your heart's chambers and valves are working. This procedure takes approximately one hour. There are no restrictions for this procedure.  Your physician has requested that you have a lexiscan myoview. For further information please visit [email or web address] Please follow instruction sheet, as given.  Your physician has requested that you have a carotid duplex. This test is an ultrasound of the carotid arteries in your neck. It looks at blood flow through these arteries that supply the brain with blood. Allow one hour for this exam. There are no restrictions or special instructions.  Your physician has requested that you wear a blood pressure monitor.   Follow-Up: At [facility], you and your health needs are our priority.  As part of our continuing mission to provide you with exceptional heart care, we have created designated Provider Care Teams.  These Care Teams include your primary Cardiologist (physician) and Advanced Practice Providers (APPs -  Physician Assistants and Nurse Practitioners) who all work together  to provide you with the care you need, when you need it.   Your next appointment:   1 year(s)  The format for your next appointment:   In Person  Provider:   You may see Armanda Magic, MD or one of the following Advanced Practice Providers on your designated Care Team:   Ronie Spies, PA-C Jacolyn Reedy, PA-C

## 2021-04-14 NOTE — Addendum Note (Signed)
Addended by: Theresia Majors on: 04/14/2021 01:55 PM   Modules accepted: Orders

## 2021-04-14 NOTE — Progress Notes (Signed)
Cardiology CONSULT Note    Date:  04/14/2021   ID:  Casimer Lanius, DOB 13-Nov-1940, MRN [id number]  PCP:  Merri Brunette, MD  Cardiologist:  Armanda Magic, MD   Chief Complaint  Patient presents with   New Patient (Initial Visit)    Pre-syncope, HLD and coronary artery calcifications     History of Present Illness:  Christy [name] is a 80 y.o. female who is being seen today for the evaluation of Presyncope and coronary artery calcium on Chest CT at the request of Overton Mam, DO.  This is a 80yo female with a hx of DM who recently was seen by her PCP for evaulation of pre-syncope.  She wore a ziopatch which showed brief runs of nonsustained atrial tachycardia, PACs and PVCs.  Coronary Ca score was done in July 2022 showing an elevated coronary Ca level at 1121 which is 92nd% for her age and sex matched controls.  There was also aortic atherosclerosis.    She denies any chest pain or pressure, SOB, DOE, PND, orthopnea, LE edema.  She says that recently she had been having some dizzy spells but no syncope.  She described a feeing like a black shield coming down over her body and felt like she was going to pass out. She had 3 episodes.   She kept a log of BP readings in Aug that were normal.  Apparently around the time her dizziness started she had been placed on Ozempic and she thought it was related to drops in her BS.  The Ozempic was stopped and since then her dizziness has completely resolved.    Past Medical History:  Diagnosis Date   Allergy    Arthritis    Chronic constipation    Depression    Diabetes mellitus without complication (HCC)    Elevated blood pressure reading in office with diagnosis of hypertension     Past Surgical History:  Procedure Laterality Date   ABDOMINAL HYSTERECTOMY     ACHILLES TENDON REPAIR     BREAST REDUCTION SURGERY     CATARACT EXTRACTION     masectomy Right    TONSILLECTOMY AND ADENOIDECTOMY      Current Medications: Current Meds   Medication Sig   Calcium Citrate 150 MG CAPS Take 1,000 mg by mouth daily.     Cholecalciferol (VITAMIN D) 1000 UNITS capsule Take 1,000 Units by mouth daily.     Exenatide ER (BYDUREON) 2 MG SUSR Inject 2 mg into the skin once a week.     folic acid (FOLVITE) 1 MG tablet Take 1 mg by mouth daily.     metFORMIN (GLUCOPHAGE) 500 MG tablet Take 500 mg by mouth 2 (two) times daily with a meal.     methotrexate (RHEUMATREX) 2.5 MG tablet Take 2.5 mg by mouth. Caution:Chemotherapy. Protect from light.    MULTIPLE VITAMIN PO Take 1 tablet by mouth daily.     omega-3 acid ethyl esters (LOVAZA) 1 g capsule Take by mouth 2 (two) times daily.   PSYLLIUM PO Take 8-10 capsules by mouth daily.     Selenium 200 MCG CAPS Take 200 mcg by mouth daily.     TRULICITY 0.75 MG/0.5ML SOPN INJ 0.75 MG [place] Q WK    Allergies:   Epinephrine, Erythromycin, Gluten, Penicillins, and Tetracyclines & related   Social History   Socioeconomic History   Marital status: Married    Spouse name: Not on file   Number of children: Not on file  Years of education: Not on file   Highest education level: Not on file  Occupational History   Not on file  Tobacco Use   Smoking status: Never   Smokeless tobacco: Never  Vaping Use   Vaping Use: Never used  Substance and Sexual Activity   Alcohol use: Not Currently   Drug use: Not Currently   Sexual activity: Not on file  Other Topics Concern   Not on file  Social History Narrative   Not on file   Social Determinants of Health   Financial Resource Strain: Not on file  Food Insecurity: Not on file  Transportation Needs: Not on file  Physical Activity: Not on file  Stress: Not on file  Social Connections: Not on file     Family History:  The patient's family history includes Alcohol abuse in her father; Arthritis in her maternal grandmother, mother, and sister; Cancer in her father and sister; Dementia in her mother; Depression in her son; Diabetes in her son;  Early death in her sister; Hearing loss in her maternal grandmother; Heart attack in her maternal grandfather; Learning disabilities in her son; Stroke in her mother.   ROS:   Please see the history of present illness.    ROS All other systems reviewed and are negative.  No flowsheet data found.     PHYSICAL EXAM:   VS:  BP (!) 155/74   Pulse 72   Ht 5\' 4"  (1.626 m)   Wt 171 lb 6.4 oz (77.7 kg)   SpO2 97%   BMI 29.42 kg/m    GEN: Well nourished, well developed, in no acute distress  HEENT: normal  Neck: no JVD, carotid bruits, or masses Cardiac: RRR; no murmurs, rubs, or gallops,no edema.  Intact distal pulses bilaterally.  Respiratory:  clear to auscultation bilaterally, normal work of breathing GI: soft, nontender, nondistended, + BS MS: no deformity or atrophy  Skin: warm and dry, no rash Neuro:  Alert and Oriented x 3, Strength and sensation are intact Psych: euthymic mood, full affect  Wt Readings from Last 3 Encounters:  04/14/21 171 lb 6.4 oz (77.7 kg)  02/22/19 179 lb 3.2 oz (81.3 kg)  03/22/11 170 lb (77.1 kg)      Studies/Labs Reviewed:   EKG:  EKG is ordered today.  The ekg ordered today demonstrates NSR with nonspecific T wave abnormality  Recent Labs: No results found for requested labs within last 8760 hours.   Lipid Panel    Component Value Date/Time   CHOL 184 02/26/2019 0000   TRIG 100 02/26/2019 0000   HDL 80 (A) 02/26/2019 0000   LDLCALC 84 02/26/2019 0000    Additional studies/ records that were reviewed today include:  Coronary Ca score, Ziopatch, OV notes from PCP, EKG    ASSESSMENT:    1. Pre-syncope   2. Agatston coronary artery calcium score greater than 400   3. Pure hypercholesterolemia   4. PAT (paroxysmal atrial tachycardia) (HCC)   5. Primary hypertension      PLAN:  In order of problems listed above:  Pre syncope - unclear etiology - orthostatics dropped 02/28/2019 with initial standing but then normalized>>I do not  think her sx are related to orthostasis and they completely resolved after stopping Ozempic - Ziopatch with nonsustained atrial tachycardia but no post termination pauses - will get 2D echo to assess LVF as well as Lexiscan myoview to rule out ischemia - encouraged compression hose when standing for long periods of time -  check carotid dopplers given her high Ca score she is high risk for other vascular disease  2.  Coronary artery calcium -coronary Ca score was elevated at 1121 -I will get a Lexiscan myoview to rule out ischemia -Shared Decision Making/Informed Consent The risks [chest pain, shortness of breath, cardiac arrhythmias, dizziness, blood pressure fluctuations, myocardial infarction, stroke/transient ischemic attack, nausea, vomiting, allergic reaction, radiation exposure, metallic taste sensation and life-threatening complications (estimated to be 1 in 10,000)], benefits (risk stratification, diagnosing coronary artery disease, treatment guidance) and alternatives of a nuclear stress test were discussed in detail with Christy [name] and she agrees to proceed. -I have educated her on signs and sx of coronary ischemia including exertional CP or pressure, DOE or exertional fatigue  3.  HLD -I have personally reviewed and interpreted outside Labs performed by patient's PCP which showed LDL 79, HDL 91, TAGs in July 2021 -LDL goal < 70 -check FLP and ALT -currently not on statin therapy and likely will need to be placed on this due to coronary artery calcium   4.  Nonsustained atrial tachycardia -noted on ziopatch -she is completely asymptomatic so no treatment needed at this time  5.  [place]at HTN -Bp elevated on exam today but at home was 122/64mmHg this am -this is diet controlled -I will get a 48 hour BP monitor to assess adequacy of control  Time Spent: 25 minutes total time of encounter, including 15 minutes spent in face-to-face patient care on the date of this encounter.  This time includes coordination of care and counseling regarding above mentioned problem list. Remainder of non-face-to-face time involved reviewing chart documents/testing relevant to the patient encounter and documentation in the medical record. I have independently reviewed documentation from referring provider  Medication Adjustments/Labs and Tests Ordered: Current medicines are reviewed at length with the patient today.  Concerns regarding medicines are outlined above.  Medication changes, Labs and Tests ordered today are listed in the Patient Instructions below.  There are no Patient Instructions on file for this visit.   Signed, Armanda Magic, MD  04/14/2021 1:38 PM    [facility] [address] [place], [place], [place]  [address] Phone: [phone]; Fax: [phone]

## 2021-04-21 ENCOUNTER — Other Ambulatory Visit: Payer: Medicare PPO

## 2021-04-21 ENCOUNTER — Telehealth: Payer: Self-pay | Admitting: *Deleted

## 2021-04-21 NOTE — Telephone Encounter (Signed)
LMVM Please call Camdan Burdi in monitor department to schedule an appointment. [phone]. 

## 2021-04-21 NOTE — Addendum Note (Signed)
Addended by: Armanda Magic R on: 04/21/2021 07:04 PM   Modules accepted: Orders

## 2021-04-22 ENCOUNTER — Telehealth: Payer: Self-pay | Admitting: *Deleted

## 2021-04-22 ENCOUNTER — Ambulatory Visit: Payer: Medicare PPO

## 2021-04-22 NOTE — Telephone Encounter (Signed)
Informed patient it will be ok if patient checks her b/p for a day on an hourly basis instead of having a 24 hour ambulatory blood pressure monitor.  She can drop off her results at our office when she comes in for her Echocardiogram 05/13/21.

## 2021-04-22 NOTE — Telephone Encounter (Signed)
Patient had been contacted to schedule Ambulatory Blood Pressure monitor.  Available appointments would be the first or second week in October. Patient  would like to ask Dr. Mayford Knife if she could just  check her blood pressure on an hourly basis for 24 hours using her equipment instead.  She states her equipment is reliable and she has done that before. Please let patient know if this would be acceptable.  If acceptable please cancel blood pressure monitor order.  If not acceptable, patient has been instructed to call our office back to schedule test.

## 2021-04-23 ENCOUNTER — Other Ambulatory Visit: Payer: Self-pay

## 2021-04-23 ENCOUNTER — Ambulatory Visit (HOSPITAL_COMMUNITY)
Admission: RE | Admit: 2021-04-23 | Discharge: 2021-04-23 | Disposition: A | Discharge status: Home / Self Care | Payer: Medicare PPO | Source: Ambulatory Visit | Attending: Cardiology | Admitting: Cardiology

## 2021-04-23 DIAGNOSIS — E7800 Pure hypercholesterolemia, unspecified: Secondary | ICD-10-CM | POA: Diagnosis not present

## 2021-04-23 DIAGNOSIS — I471 Supraventricular tachycardia: Secondary | ICD-10-CM

## 2021-04-23 DIAGNOSIS — R55 Syncope and collapse: Secondary | ICD-10-CM | POA: Diagnosis not present

## 2021-04-23 DIAGNOSIS — I10 Essential (primary) hypertension: Secondary | ICD-10-CM | POA: Diagnosis not present

## 2021-04-23 DIAGNOSIS — R931 Abnormal findings on diagnostic imaging of heart and coronary circulation: Secondary | ICD-10-CM | POA: Diagnosis not present

## 2021-04-28 ENCOUNTER — Other Ambulatory Visit: Payer: Self-pay

## 2021-04-28 ENCOUNTER — Other Ambulatory Visit: Payer: Medicare PPO

## 2021-04-28 DIAGNOSIS — R55 Syncope and collapse: Secondary | ICD-10-CM | POA: Diagnosis not present

## 2021-04-28 DIAGNOSIS — I10 Essential (primary) hypertension: Secondary | ICD-10-CM | POA: Diagnosis not present

## 2021-04-28 DIAGNOSIS — E7800 Pure hypercholesterolemia, unspecified: Secondary | ICD-10-CM

## 2021-04-28 DIAGNOSIS — R931 Abnormal findings on diagnostic imaging of heart and coronary circulation: Secondary | ICD-10-CM

## 2021-04-28 DIAGNOSIS — I471 Supraventricular tachycardia: Secondary | ICD-10-CM

## 2021-04-28 LAB — LIPID PANEL
Chol/HDL Ratio: 2.5 ratio (ref 0.0–4.4)
Cholesterol, Total: 198 mg/dL (ref 100–199)
HDL: 78 mg/dL (ref >39)
LDL Chol Calc (NIH): 110 mg/dL — ABNORMAL HIGH (ref 0–99)
Triglycerides: 52 mg/dL (ref 0–149)
VLDL Cholesterol Cal: 10 mg/dL (ref 5–40)

## 2021-04-28 LAB — ALT: ALT: 10 IU/L (ref 0–32)

## 2021-04-29 ENCOUNTER — Telehealth: Payer: Self-pay

## 2021-04-29 DIAGNOSIS — R931 Abnormal findings on diagnostic imaging of heart and coronary circulation: Secondary | ICD-10-CM

## 2021-04-29 MED ORDER — ATORVASTATIN CALCIUM 20 MG PO TABS: 20.0000 mg | ORAL_TABLET | Freq: Every day | ORAL | 3 refills | Status: DC

## 2021-04-29 NOTE — Telephone Encounter (Signed)
The patient has been notified of the result and verbalized understanding.  All questions (if any) were answered. Theresia Majors, RN 04/29/2021 11:32 AM  Rx has been sent in for atorvastatin 20 mg daily. Repeat lab work has been scheduled.

## 2021-04-29 NOTE — Telephone Encounter (Signed)
-----   Message from Quintella Reichert, MD sent at 04/29/2021 10:53 AM EDT ----- LDL goal < 70 - not at goal.  Add Atorvastatin 20mg  daily and repeat FLP and ALT in 6 weeks

## 2021-05-06 ENCOUNTER — Telehealth (HOSPITAL_COMMUNITY): Payer: Self-pay | Admitting: *Deleted

## 2021-05-06 NOTE — Telephone Encounter (Signed)
Patient given detailed instructions per Myocardial Perfusion Study Information Sheet for the test on 05/15/21 Patient notified to arrive 15 minutes early and that it is imperative to arrive on time for appointment to keep from having the test rescheduled.  If you need to cancel or reschedule your appointment, please call the office within 24 hours of your appointment. . Patient verbalized understanding. Ricky Ala

## 2021-05-13 ENCOUNTER — Encounter: Payer: Self-pay | Admitting: Cardiology

## 2021-05-13 ENCOUNTER — Ambulatory Visit (HOSPITAL_COMMUNITY): Payer: Medicare PPO | Attending: Cardiology

## 2021-05-13 ENCOUNTER — Ambulatory Visit (HOSPITAL_BASED_OUTPATIENT_CLINIC_OR_DEPARTMENT_OTHER): Payer: Medicare PPO

## 2021-05-13 ENCOUNTER — Other Ambulatory Visit: Payer: Self-pay

## 2021-05-13 DIAGNOSIS — E7800 Pure hypercholesterolemia, unspecified: Secondary | ICD-10-CM | POA: Insufficient documentation

## 2021-05-13 DIAGNOSIS — R55 Syncope and collapse: Secondary | ICD-10-CM | POA: Diagnosis not present

## 2021-05-13 DIAGNOSIS — I471 Supraventricular tachycardia: Secondary | ICD-10-CM | POA: Diagnosis not present

## 2021-05-13 DIAGNOSIS — J984 Other disorders of lung: Secondary | ICD-10-CM | POA: Insufficient documentation

## 2021-05-13 DIAGNOSIS — R931 Abnormal findings on diagnostic imaging of heart and coronary circulation: Secondary | ICD-10-CM | POA: Insufficient documentation

## 2021-05-13 DIAGNOSIS — I10 Essential (primary) hypertension: Secondary | ICD-10-CM | POA: Insufficient documentation

## 2021-05-13 LAB — MYOCARDIAL PERFUSION IMAGING
Base ST Depression (mm): 0 mm
LV dias vol: 6 mL (ref 46–106)
LV sys vol: 44 mL
Nuc Stress EF: 86 %
Peak HR: 105 {beats}/min
Rest HR: 83 {beats}/min
Rest Nuclear Isotope Dose: 10.6 mCi
SDS: 1
SRS: 0
SSS: 1
ST Depression (mm): 0 mm
Stress Nuclear Isotope Dose: 32.1 mCi
TID: 0.91

## 2021-05-13 LAB — ECHOCARDIOGRAM COMPLETE
Area-P 1/2: 3.08 cm2
Height: 64 in
S' Lateral: 2.5 cm
Weight: 2736 oz

## 2021-05-13 MED ORDER — TECHNETIUM TC 99M TETROFOSMIN IV KIT
32.1000 | PACK | Freq: Once | INTRAVENOUS | Status: AC | PRN
  Administered 2021-05-13: 32.1 via INTRAVENOUS
  Filled 2021-05-13: qty 33

## 2021-05-13 MED ORDER — TECHNETIUM TC 99M TETROFOSMIN IV KIT
10.6000 | PACK | Freq: Once | INTRAVENOUS | Status: AC | PRN
  Administered 2021-05-13: 10.6 via INTRAVENOUS
  Filled 2021-05-13: qty 11

## 2021-05-13 MED ORDER — REGADENOSON 0.4 MG/5ML IV SOLN
0.4000 mg | Freq: Once | INTRAVENOUS | Status: AC
  Administered 2021-05-13: 0.4 mg via INTRAVENOUS

## 2021-05-14 ENCOUNTER — Telehealth: Payer: Self-pay | Admitting: Cardiology

## 2021-05-14 DIAGNOSIS — J984 Other disorders of lung: Secondary | ICD-10-CM

## 2021-05-14 DIAGNOSIS — I371 Nonrheumatic pulmonary valve insufficiency: Secondary | ICD-10-CM

## 2021-05-14 NOTE — Telephone Encounter (Signed)
-----   Message from Quintella Reichert, MD sent at 05/13/2021  1:42 PM EDT ----- Echo showed normal heart function with increased stiffness of heart muscle normal for age, mildly enlarged LA and moderately leaky PV =- repeat echo in 1 year for PR

## 2021-05-14 NOTE — Telephone Encounter (Signed)
The patient has been notified of the result and verbalized understanding.  All questions (if any) were answered.  Pt aware that I will go ahead and place the order for repeat echo in one year in the system, and our Echo scheduler will call her closer to that time frame to arrange this appt. Pt verbalized understanding and agrees with this plan.

## 2021-05-14 NOTE — Telephone Encounter (Signed)
Pt is returning call for her most recent tests on 05/13/21. Pt will be at home until 3pm today, after 3pm please contact pt on her cellphone [phone]. Please advise pt further

## 2021-05-26 DIAGNOSIS — E538 Deficiency of other specified B group vitamins: Secondary | ICD-10-CM | POA: Diagnosis not present

## 2021-05-26 DIAGNOSIS — F32A Depression, unspecified: Secondary | ICD-10-CM | POA: Diagnosis not present

## 2021-05-26 DIAGNOSIS — D84821 Immunodeficiency due to drugs: Secondary | ICD-10-CM | POA: Diagnosis not present

## 2021-05-26 DIAGNOSIS — K5900 Constipation, unspecified: Secondary | ICD-10-CM | POA: Diagnosis not present

## 2021-05-26 DIAGNOSIS — E119 Type 2 diabetes mellitus without complications: Secondary | ICD-10-CM | POA: Diagnosis not present

## 2021-05-26 DIAGNOSIS — M069 Rheumatoid arthritis, unspecified: Secondary | ICD-10-CM | POA: Diagnosis not present

## 2021-05-26 DIAGNOSIS — D8481 Immunodeficiency due to conditions classified elsewhere: Secondary | ICD-10-CM | POA: Diagnosis not present

## 2021-05-26 DIAGNOSIS — G8929 Other chronic pain: Secondary | ICD-10-CM | POA: Diagnosis not present

## 2021-05-26 DIAGNOSIS — E785 Hyperlipidemia, unspecified: Secondary | ICD-10-CM | POA: Diagnosis not present

## 2021-06-23 DIAGNOSIS — G4721 Circadian rhythm sleep disorder, delayed sleep phase type: Secondary | ICD-10-CM | POA: Diagnosis not present

## 2021-06-24 ENCOUNTER — Other Ambulatory Visit: Payer: Medicare PPO

## 2021-07-07 ENCOUNTER — Other Ambulatory Visit: Payer: Medicare PPO | Admitting: *Deleted

## 2021-07-07 ENCOUNTER — Other Ambulatory Visit: Payer: Self-pay

## 2021-07-07 DIAGNOSIS — E785 Hyperlipidemia, unspecified: Secondary | ICD-10-CM | POA: Diagnosis not present

## 2021-07-07 DIAGNOSIS — R931 Abnormal findings on diagnostic imaging of heart and coronary circulation: Secondary | ICD-10-CM

## 2021-07-07 LAB — LIPID PANEL
Chol/HDL Ratio: 1.9 ratio (ref 0.0–4.4)
Cholesterol, Total: 168 mg/dL (ref 100–199)
HDL: 89 mg/dL (ref >39)
LDL Chol Calc (NIH): 69 mg/dL (ref 0–99)
Triglycerides: 49 mg/dL (ref 0–149)
VLDL Cholesterol Cal: 10 mg/dL (ref 5–40)

## 2021-07-07 LAB — ALT: ALT: 18 IU/L (ref 0–32)

## 2021-07-27 DIAGNOSIS — M0579 Rheumatoid arthritis with rheumatoid factor of multiple sites without organ or systems involvement: Secondary | ICD-10-CM | POA: Diagnosis not present

## 2021-08-06 DIAGNOSIS — H524 Presbyopia: Secondary | ICD-10-CM | POA: Diagnosis not present

## 2021-08-06 DIAGNOSIS — H35363 Drusen (degenerative) of macula, bilateral: Secondary | ICD-10-CM | POA: Diagnosis not present

## 2021-08-06 DIAGNOSIS — H52223 Regular astigmatism, bilateral: Secondary | ICD-10-CM | POA: Diagnosis not present

## 2021-08-06 DIAGNOSIS — E119 Type 2 diabetes mellitus without complications: Secondary | ICD-10-CM | POA: Diagnosis not present

## 2021-08-06 DIAGNOSIS — H16223 Keratoconjunctivitis sicca, not specified as Sjogren's, bilateral: Secondary | ICD-10-CM | POA: Diagnosis not present

## 2021-08-06 DIAGNOSIS — Z961 Presence of intraocular lens: Secondary | ICD-10-CM | POA: Diagnosis not present

## 2021-08-06 DIAGNOSIS — H5201 Hypermetropia, right eye: Secondary | ICD-10-CM | POA: Diagnosis not present

## 2021-10-06 DIAGNOSIS — Z6830 Body mass index (BMI) 30.0-30.9, adult: Secondary | ICD-10-CM | POA: Diagnosis not present

## 2021-10-06 DIAGNOSIS — M150 Primary generalized (osteo)arthritis: Secondary | ICD-10-CM | POA: Diagnosis not present

## 2021-10-06 DIAGNOSIS — M0579 Rheumatoid arthritis with rheumatoid factor of multiple sites without organ or systems involvement: Secondary | ICD-10-CM | POA: Diagnosis not present

## 2021-10-06 DIAGNOSIS — E559 Vitamin D deficiency, unspecified: Secondary | ICD-10-CM | POA: Diagnosis not present

## 2021-10-06 DIAGNOSIS — E669 Obesity, unspecified: Secondary | ICD-10-CM | POA: Diagnosis not present

## 2021-10-08 DIAGNOSIS — H02421 Myogenic ptosis of right eyelid: Secondary | ICD-10-CM | POA: Diagnosis not present

## 2021-10-08 DIAGNOSIS — H0102A Squamous blepharitis right eye, upper and lower eyelids: Secondary | ICD-10-CM | POA: Diagnosis not present

## 2021-10-08 DIAGNOSIS — H16223 Keratoconjunctivitis sicca, not specified as Sjogren's, bilateral: Secondary | ICD-10-CM | POA: Diagnosis not present

## 2021-10-20 DIAGNOSIS — F5104 Psychophysiologic insomnia: Secondary | ICD-10-CM | POA: Diagnosis not present

## 2021-10-20 DIAGNOSIS — E1165 Type 2 diabetes mellitus with hyperglycemia: Secondary | ICD-10-CM | POA: Diagnosis not present

## 2021-10-20 DIAGNOSIS — R030 Elevated blood-pressure reading, without diagnosis of hypertension: Secondary | ICD-10-CM | POA: Diagnosis not present

## 2021-11-04 DIAGNOSIS — E119 Type 2 diabetes mellitus without complications: Secondary | ICD-10-CM | POA: Diagnosis not present

## 2021-11-04 DIAGNOSIS — E7801 Familial hypercholesterolemia: Secondary | ICD-10-CM | POA: Diagnosis not present

## 2021-11-04 DIAGNOSIS — I10 Essential (primary) hypertension: Secondary | ICD-10-CM | POA: Diagnosis not present

## 2021-11-05 DIAGNOSIS — I10 Essential (primary) hypertension: Secondary | ICD-10-CM | POA: Diagnosis not present

## 2021-11-05 DIAGNOSIS — E7801 Familial hypercholesterolemia: Secondary | ICD-10-CM | POA: Diagnosis not present

## 2021-11-05 DIAGNOSIS — E119 Type 2 diabetes mellitus without complications: Secondary | ICD-10-CM | POA: Diagnosis not present

## 2021-11-06 LAB — LIPID PANEL
Cholesterol: 153 (ref 0–200)
HDL: 89 — AB (ref 35–70)
LDL Cholesterol: 52
Triglycerides: 59 (ref 40–160)

## 2021-11-06 LAB — CBC AND DIFFERENTIAL
HCT: 37 (ref 36–46)
Hemoglobin: 11.8 — AB (ref 12.0–16.0)
WBC: 6.3

## 2021-11-10 DIAGNOSIS — M069 Rheumatoid arthritis, unspecified: Secondary | ICD-10-CM | POA: Diagnosis not present

## 2021-11-10 DIAGNOSIS — N1831 Chronic kidney disease, stage 3a: Secondary | ICD-10-CM | POA: Diagnosis not present

## 2021-11-10 DIAGNOSIS — F5104 Psychophysiologic insomnia: Secondary | ICD-10-CM | POA: Diagnosis not present

## 2021-11-10 DIAGNOSIS — R351 Nocturia: Secondary | ICD-10-CM | POA: Diagnosis not present

## 2021-11-10 DIAGNOSIS — Z0000 Encounter for general adult medical examination without abnormal findings: Secondary | ICD-10-CM | POA: Diagnosis not present

## 2021-11-10 DIAGNOSIS — I700 Atherosclerosis of aorta: Secondary | ICD-10-CM | POA: Diagnosis not present

## 2021-11-10 DIAGNOSIS — H9313 Tinnitus, bilateral: Secondary | ICD-10-CM | POA: Diagnosis not present

## 2021-11-10 DIAGNOSIS — I10 Essential (primary) hypertension: Secondary | ICD-10-CM | POA: Diagnosis not present

## 2021-11-10 DIAGNOSIS — E118 Type 2 diabetes mellitus with unspecified complications: Secondary | ICD-10-CM | POA: Diagnosis not present

## 2021-11-11 DIAGNOSIS — G4721 Circadian rhythm sleep disorder, delayed sleep phase type: Secondary | ICD-10-CM | POA: Diagnosis not present

## 2021-11-11 DIAGNOSIS — G4700 Insomnia, unspecified: Secondary | ICD-10-CM | POA: Diagnosis not present

## 2021-11-23 DIAGNOSIS — H10502 Unspecified blepharoconjunctivitis, left eye: Secondary | ICD-10-CM | POA: Diagnosis not present

## 2021-11-23 DIAGNOSIS — H0102A Squamous blepharitis right eye, upper and lower eyelids: Secondary | ICD-10-CM | POA: Diagnosis not present

## 2021-11-25 DIAGNOSIS — H0102A Squamous blepharitis right eye, upper and lower eyelids: Secondary | ICD-10-CM | POA: Diagnosis not present

## 2021-11-25 DIAGNOSIS — H16223 Keratoconjunctivitis sicca, not specified as Sjogren's, bilateral: Secondary | ICD-10-CM | POA: Diagnosis not present

## 2021-12-11 DIAGNOSIS — E119 Type 2 diabetes mellitus without complications: Secondary | ICD-10-CM | POA: Diagnosis not present

## 2021-12-11 DIAGNOSIS — R5383 Other fatigue: Secondary | ICD-10-CM | POA: Diagnosis not present

## 2021-12-11 DIAGNOSIS — R635 Abnormal weight gain: Secondary | ICD-10-CM | POA: Diagnosis not present

## 2021-12-11 DIAGNOSIS — N951 Menopausal and female climacteric states: Secondary | ICD-10-CM | POA: Diagnosis not present

## 2021-12-11 DIAGNOSIS — E7800 Pure hypercholesterolemia, unspecified: Secondary | ICD-10-CM | POA: Diagnosis not present

## 2021-12-16 DIAGNOSIS — E119 Type 2 diabetes mellitus without complications: Secondary | ICD-10-CM | POA: Diagnosis not present

## 2021-12-16 DIAGNOSIS — Z1331 Encounter for screening for depression: Secondary | ICD-10-CM | POA: Diagnosis not present

## 2021-12-16 DIAGNOSIS — R635 Abnormal weight gain: Secondary | ICD-10-CM | POA: Diagnosis not present

## 2021-12-16 DIAGNOSIS — M069 Rheumatoid arthritis, unspecified: Secondary | ICD-10-CM | POA: Diagnosis not present

## 2021-12-16 DIAGNOSIS — Z1339 Encounter for screening examination for other mental health and behavioral disorders: Secondary | ICD-10-CM | POA: Diagnosis not present

## 2021-12-16 DIAGNOSIS — E7800 Pure hypercholesterolemia, unspecified: Secondary | ICD-10-CM | POA: Diagnosis not present

## 2021-12-16 DIAGNOSIS — N951 Menopausal and female climacteric states: Secondary | ICD-10-CM | POA: Diagnosis not present

## 2021-12-16 DIAGNOSIS — M2550 Pain in unspecified joint: Secondary | ICD-10-CM | POA: Diagnosis not present

## 2021-12-16 DIAGNOSIS — Z6830 Body mass index (BMI) 30.0-30.9, adult: Secondary | ICD-10-CM | POA: Diagnosis not present

## 2021-12-28 DIAGNOSIS — E119 Type 2 diabetes mellitus without complications: Secondary | ICD-10-CM | POA: Diagnosis not present

## 2021-12-28 DIAGNOSIS — Z6830 Body mass index (BMI) 30.0-30.9, adult: Secondary | ICD-10-CM | POA: Diagnosis not present

## 2022-01-11 DIAGNOSIS — Z6829 Body mass index (BMI) 29.0-29.9, adult: Secondary | ICD-10-CM | POA: Diagnosis not present

## 2022-01-11 DIAGNOSIS — E7800 Pure hypercholesterolemia, unspecified: Secondary | ICD-10-CM | POA: Diagnosis not present

## 2022-01-12 DIAGNOSIS — M0579 Rheumatoid arthritis with rheumatoid factor of multiple sites without organ or systems involvement: Secondary | ICD-10-CM | POA: Diagnosis not present

## 2022-01-19 DIAGNOSIS — F4323 Adjustment disorder with mixed anxiety and depressed mood: Secondary | ICD-10-CM | POA: Diagnosis not present

## 2022-01-26 DIAGNOSIS — M6281 Muscle weakness (generalized): Secondary | ICD-10-CM | POA: Diagnosis not present

## 2022-01-26 DIAGNOSIS — I10 Essential (primary) hypertension: Secondary | ICD-10-CM | POA: Diagnosis not present

## 2022-01-26 DIAGNOSIS — K5909 Other constipation: Secondary | ICD-10-CM | POA: Diagnosis not present

## 2022-01-26 DIAGNOSIS — E119 Type 2 diabetes mellitus without complications: Secondary | ICD-10-CM | POA: Diagnosis not present

## 2022-01-26 LAB — HEMOGLOBIN A1C: Hemoglobin A1C: 6.7

## 2022-01-27 DIAGNOSIS — F4323 Adjustment disorder with mixed anxiety and depressed mood: Secondary | ICD-10-CM | POA: Diagnosis not present

## 2022-01-29 DIAGNOSIS — M6281 Muscle weakness (generalized): Secondary | ICD-10-CM | POA: Diagnosis not present

## 2022-01-29 DIAGNOSIS — R2689 Other abnormalities of gait and mobility: Secondary | ICD-10-CM | POA: Diagnosis not present

## 2022-01-29 DIAGNOSIS — M2560 Stiffness of unspecified joint, not elsewhere classified: Secondary | ICD-10-CM | POA: Diagnosis not present

## 2022-01-29 DIAGNOSIS — M5459 Other low back pain: Secondary | ICD-10-CM | POA: Diagnosis not present

## 2022-02-02 DIAGNOSIS — M5459 Other low back pain: Secondary | ICD-10-CM | POA: Diagnosis not present

## 2022-02-02 DIAGNOSIS — R2689 Other abnormalities of gait and mobility: Secondary | ICD-10-CM | POA: Diagnosis not present

## 2022-02-02 DIAGNOSIS — M2560 Stiffness of unspecified joint, not elsewhere classified: Secondary | ICD-10-CM | POA: Diagnosis not present

## 2022-02-02 DIAGNOSIS — M6281 Muscle weakness (generalized): Secondary | ICD-10-CM | POA: Diagnosis not present

## 2022-02-03 DIAGNOSIS — F4323 Adjustment disorder with mixed anxiety and depressed mood: Secondary | ICD-10-CM | POA: Diagnosis not present

## 2022-02-08 DIAGNOSIS — M2560 Stiffness of unspecified joint, not elsewhere classified: Secondary | ICD-10-CM | POA: Diagnosis not present

## 2022-02-08 DIAGNOSIS — R2689 Other abnormalities of gait and mobility: Secondary | ICD-10-CM | POA: Diagnosis not present

## 2022-02-08 DIAGNOSIS — M6281 Muscle weakness (generalized): Secondary | ICD-10-CM | POA: Diagnosis not present

## 2022-02-08 DIAGNOSIS — M5459 Other low back pain: Secondary | ICD-10-CM | POA: Diagnosis not present

## 2022-02-10 DIAGNOSIS — F4323 Adjustment disorder with mixed anxiety and depressed mood: Secondary | ICD-10-CM | POA: Diagnosis not present

## 2022-02-11 DIAGNOSIS — M2560 Stiffness of unspecified joint, not elsewhere classified: Secondary | ICD-10-CM | POA: Diagnosis not present

## 2022-02-11 DIAGNOSIS — R2689 Other abnormalities of gait and mobility: Secondary | ICD-10-CM | POA: Diagnosis not present

## 2022-02-11 DIAGNOSIS — M6281 Muscle weakness (generalized): Secondary | ICD-10-CM | POA: Diagnosis not present

## 2022-02-11 DIAGNOSIS — M5459 Other low back pain: Secondary | ICD-10-CM | POA: Diagnosis not present

## 2022-02-15 DIAGNOSIS — M6281 Muscle weakness (generalized): Secondary | ICD-10-CM | POA: Diagnosis not present

## 2022-02-15 DIAGNOSIS — R2689 Other abnormalities of gait and mobility: Secondary | ICD-10-CM | POA: Diagnosis not present

## 2022-02-15 DIAGNOSIS — M2560 Stiffness of unspecified joint, not elsewhere classified: Secondary | ICD-10-CM | POA: Diagnosis not present

## 2022-02-15 DIAGNOSIS — M5459 Other low back pain: Secondary | ICD-10-CM | POA: Diagnosis not present

## 2022-02-16 DIAGNOSIS — E119 Type 2 diabetes mellitus without complications: Secondary | ICD-10-CM | POA: Diagnosis not present

## 2022-02-16 DIAGNOSIS — I10 Essential (primary) hypertension: Secondary | ICD-10-CM | POA: Diagnosis not present

## 2022-02-16 DIAGNOSIS — K5909 Other constipation: Secondary | ICD-10-CM | POA: Diagnosis not present

## 2022-02-19 LAB — BASIC METABOLIC PANEL
BUN: 20 (ref 4–21)
CO2: 27 — AB (ref 13–22)
Chloride: 103 (ref 99–108)
Creatinine: 0.9 (ref <=1.1)
Glucose: 130
Potassium: 4.3 mEq/L (ref 3.5–5.1)
Sodium: 139 (ref 137–147)

## 2022-02-19 LAB — COMPREHENSIVE METABOLIC PANEL
Calcium: 9.5 (ref 8.7–10.7)
eGFR: 59

## 2022-02-23 DIAGNOSIS — M2560 Stiffness of unspecified joint, not elsewhere classified: Secondary | ICD-10-CM | POA: Diagnosis not present

## 2022-02-23 DIAGNOSIS — R2689 Other abnormalities of gait and mobility: Secondary | ICD-10-CM | POA: Diagnosis not present

## 2022-02-23 DIAGNOSIS — M6281 Muscle weakness (generalized): Secondary | ICD-10-CM | POA: Diagnosis not present

## 2022-02-23 DIAGNOSIS — M5459 Other low back pain: Secondary | ICD-10-CM | POA: Diagnosis not present

## 2022-02-24 DIAGNOSIS — F4323 Adjustment disorder with mixed anxiety and depressed mood: Secondary | ICD-10-CM | POA: Diagnosis not present

## 2022-02-25 DIAGNOSIS — M5459 Other low back pain: Secondary | ICD-10-CM | POA: Diagnosis not present

## 2022-02-25 DIAGNOSIS — M6281 Muscle weakness (generalized): Secondary | ICD-10-CM | POA: Diagnosis not present

## 2022-02-25 DIAGNOSIS — R2689 Other abnormalities of gait and mobility: Secondary | ICD-10-CM | POA: Diagnosis not present

## 2022-02-25 DIAGNOSIS — M2560 Stiffness of unspecified joint, not elsewhere classified: Secondary | ICD-10-CM | POA: Diagnosis not present

## 2022-03-01 DIAGNOSIS — M2560 Stiffness of unspecified joint, not elsewhere classified: Secondary | ICD-10-CM | POA: Diagnosis not present

## 2022-03-01 DIAGNOSIS — R2689 Other abnormalities of gait and mobility: Secondary | ICD-10-CM | POA: Diagnosis not present

## 2022-03-01 DIAGNOSIS — M6281 Muscle weakness (generalized): Secondary | ICD-10-CM | POA: Diagnosis not present

## 2022-03-01 DIAGNOSIS — M5459 Other low back pain: Secondary | ICD-10-CM | POA: Diagnosis not present

## 2022-03-03 DIAGNOSIS — F4323 Adjustment disorder with mixed anxiety and depressed mood: Secondary | ICD-10-CM | POA: Diagnosis not present

## 2022-03-04 DIAGNOSIS — M6281 Muscle weakness (generalized): Secondary | ICD-10-CM | POA: Diagnosis not present

## 2022-03-04 DIAGNOSIS — M2560 Stiffness of unspecified joint, not elsewhere classified: Secondary | ICD-10-CM | POA: Diagnosis not present

## 2022-03-04 DIAGNOSIS — R2689 Other abnormalities of gait and mobility: Secondary | ICD-10-CM | POA: Diagnosis not present

## 2022-03-04 DIAGNOSIS — M5459 Other low back pain: Secondary | ICD-10-CM | POA: Diagnosis not present

## 2022-03-08 DIAGNOSIS — R2689 Other abnormalities of gait and mobility: Secondary | ICD-10-CM | POA: Diagnosis not present

## 2022-03-08 DIAGNOSIS — M6281 Muscle weakness (generalized): Secondary | ICD-10-CM | POA: Diagnosis not present

## 2022-03-08 DIAGNOSIS — M5459 Other low back pain: Secondary | ICD-10-CM | POA: Diagnosis not present

## 2022-03-08 DIAGNOSIS — M2560 Stiffness of unspecified joint, not elsewhere classified: Secondary | ICD-10-CM | POA: Diagnosis not present

## 2022-03-11 DIAGNOSIS — M5459 Other low back pain: Secondary | ICD-10-CM | POA: Diagnosis not present

## 2022-03-11 DIAGNOSIS — M6281 Muscle weakness (generalized): Secondary | ICD-10-CM | POA: Diagnosis not present

## 2022-03-11 DIAGNOSIS — R2689 Other abnormalities of gait and mobility: Secondary | ICD-10-CM | POA: Diagnosis not present

## 2022-03-11 DIAGNOSIS — M2560 Stiffness of unspecified joint, not elsewhere classified: Secondary | ICD-10-CM | POA: Diagnosis not present

## 2022-03-23 DIAGNOSIS — F4323 Adjustment disorder with mixed anxiety and depressed mood: Secondary | ICD-10-CM | POA: Diagnosis not present

## 2022-03-31 ENCOUNTER — Encounter: Payer: Self-pay | Admitting: Nurse Practitioner

## 2022-03-31 ENCOUNTER — Ambulatory Visit (INDEPENDENT_AMBULATORY_CARE_PROVIDER_SITE_OTHER): Payer: Medicare PPO | Admitting: Nurse Practitioner

## 2022-03-31 VITALS — BP 114/60 | HR 89 | Temp 97.1°F | Wt 172.8 lb

## 2022-03-31 DIAGNOSIS — F419 Anxiety disorder, unspecified: Secondary | ICD-10-CM

## 2022-03-31 DIAGNOSIS — Z853 Personal history of malignant neoplasm of breast: Secondary | ICD-10-CM

## 2022-03-31 DIAGNOSIS — I471 Supraventricular tachycardia: Secondary | ICD-10-CM | POA: Diagnosis not present

## 2022-03-31 DIAGNOSIS — R197 Diarrhea, unspecified: Secondary | ICD-10-CM | POA: Insufficient documentation

## 2022-03-31 DIAGNOSIS — M069 Rheumatoid arthritis, unspecified: Secondary | ICD-10-CM

## 2022-03-31 DIAGNOSIS — E785 Hyperlipidemia, unspecified: Secondary | ICD-10-CM | POA: Diagnosis not present

## 2022-03-31 DIAGNOSIS — E1140 Type 2 diabetes mellitus with diabetic neuropathy, unspecified: Secondary | ICD-10-CM

## 2022-03-31 DIAGNOSIS — F32A Depression, unspecified: Secondary | ICD-10-CM | POA: Diagnosis not present

## 2022-03-31 NOTE — Assessment & Plan Note (Signed)
She has a history of anxiety and depression and is seeing a Veterinary surgeon. She states this is helping significantly. Encouraged her to continue these sessions and follow up if symptoms worsen.

## 2022-03-31 NOTE — Assessment & Plan Note (Signed)
She started having diarrhea about 2 hours after eating [place] on Sunday. She has been having multiple episodes of diarrhea per day, but has been improving in the number of episodes per day. No fevers, blood in stool, or abdominal pain. She is able to keep fluids down. Does not appear dehydrated on exam. Continue eating bland foods, can add in gatorade zero to help with some electrolytes. Follow-up if symptoms worsen or don't improve.

## 2022-03-31 NOTE — Assessment & Plan Note (Signed)
Chronic, stable. She follows with rheumatology and is taking methotrexate 2.5mg . Continue collaboration and recommendations from rheumatology.

## 2022-03-31 NOTE — Assessment & Plan Note (Signed)
She has a history of right sided breast cancer years ago. Will request prior records from her PCP.

## 2022-03-31 NOTE — Patient Instructions (Addendum)
It was great to see you!  Keep eating bland foods, you should be feeling better in the next few days. You can add in some Gatorade Zero in addition to water to help with your electrolytes.   Let's follow-up in 1-2 months, sooner if you have concerns.  If a referral was placed today, you will be contacted for an appointment. Please note that routine referrals can sometimes take up to 3-4 weeks to process. Please call our office if you haven't heard anything after this time frame.  Take care,  Rodman Pickle, NP

## 2022-03-31 NOTE — Assessment & Plan Note (Signed)
Chronic, stable. Last A1c in epic is 6.5%, however she states she had labs drawn in June at her prior PCP. Will request medical records. Her blood sugars at home range 110s-120s. Continue metformin 500mg  BID and trulicity 1.5mg  injection weekly.

## 2022-03-31 NOTE — Progress Notes (Signed)
New Patient Visit  BP 114/60   Pulse 89   Temp (!) 97.1 F (36.2 C) (Temporal)   Wt 172 lb 12.8 oz (78.4 kg)   SpO2 97%   BMI 29.66 kg/m    Subjective:    Patient ID: Christy [name], female    DOB: 1941-02-14, 81 y.o.   MRN: [id number]  CC: Chief Complaint  Patient presents with   Establish Care    Np. Est care. Pt c/o possible food poisoning w/ diarrhea x3 days, no nausea or vomiting     HPI: Christy [name] is a 81 y.o. female presents for new patient visit to establish care.  Introduced to Publishing rights manager role and practice setting.  All questions answered.  Discussed provider/patient relationship and expectations.  She got [place] Sunday afternoon. Within 1-1.5 hours, she started having diarrhea. She states that on Monday, she started taking immodium with every episode. She has been sticking with the [employer]. Her diarrhea has slightly improved and is still having urgency. She denies fevers, abdominal pain and cramping, nausea, vomiting, and blood in her stools.   She has a history of diabetes. She hasn't checked this week due to being sick. Her sugars tend to run 114-120. She denies chest pain and shortness of breath. She does endorse some neuropathy in her right foot.   She has a history of rheumatoid arthritis. She is taking methotrexate and is following with rheumatology. Right now, only her right foot is causing her some pain.   She has a history of anxiety and depression and is talking with a counselor which is helping.      03/31/2022    8:20 AM 02/22/2019    2:07 PM  Depression screen PHQ 2/9  Decreased Interest 0 0  Down, Depressed, Hopeless 0 0  PHQ - 2 Score 0 0    Past Medical History:  Diagnosis Date   Agatston coronary artery calcium score greater than 400    Ca score 1121 in July 2022   Allergy    Arthritis    Chronic constipation    Depression    Diabetes mellitus without complication (HCC)    History of breast cancer    HLD (hyperlipidemia)     PAT (paroxysmal atrial tachycardia) (HCC)    Ziopatch 02/2021   Pulmonary insufficiency    moderate by echo 05/2021   Rheumatoid arthritis (HCC)     Past Surgical History:  Procedure Laterality Date   ABDOMINAL HYSTERECTOMY     ACHILLES TENDON REPAIR     BREAST REDUCTION SURGERY Left    CATARACT EXTRACTION     masectomy Right    TONSILLECTOMY AND ADENOIDECTOMY      Family History  Problem Relation Age of Onset   Arthritis Mother    Dementia Mother    Stroke Mother    Alcohol abuse Father    Cancer Father        lung   Arthritis Sister    Cancer Sister    Early death Sister    Learning disabilities Son    Depression Son    Diabetes Son    Arthritis Maternal Grandmother    Hearing loss Maternal Grandmother    Heart attack Maternal Grandfather      Social History   Tobacco Use   Smoking status: Never   Smokeless tobacco: Never  Vaping Use   Vaping Use: Never used  Substance Use Topics   Alcohol use: Not Currently   Drug use: Not Currently  Current Outpatient Medications on File Prior to Visit  Medication Sig Dispense Refill   Dulaglutide (TRULICITY) 1.5 0000000 SOPN inject 1.5mg      atorvastatin (LIPITOR) 20 MG tablet 1 tablet     Calcium Citrate 150 MG CAPS Take 1,000 mg by mouth daily.       Cholecalciferol (VITAMIN D) 1000 UNITS capsule Take 1,000 Units by mouth daily.       folic acid (FOLVITE) 1 MG tablet Take 1 mg by mouth daily.       metFORMIN (GLUCOPHAGE) 500 MG tablet Take 500 mg by mouth 2 (two) times daily with a meal.       methotrexate (RHEUMATREX) 2.5 MG tablet Take 2.5 mg by mouth. Caution:Chemotherapy. Protect from light.      MULTIPLE VITAMIN PO Take 1 tablet by mouth daily.       omega-3 acid ethyl esters (LOVAZA) 1 g capsule Take by mouth 2 (two) times daily.     PSYLLIUM PO Take 8-10 capsules by mouth daily.       No current facility-administered medications on file prior to visit.     Review of Systems  Constitutional:  Positive  for fatigue.  HENT: Negative.    Respiratory: Negative.    Cardiovascular: Negative.   Gastrointestinal:  Positive for diarrhea. Negative for abdominal pain, blood in stool, constipation, nausea and vomiting.  Genitourinary:  Positive for frequency and urgency. Negative for dysuria.  Musculoskeletal:  Positive for arthralgias (right foot).  Skin: Negative.   Neurological: Negative.   Psychiatric/Behavioral: Negative.        Objective:    BP 114/60   Pulse 89   Temp (!) 97.1 F (36.2 C) (Temporal)   Wt 172 lb 12.8 oz (78.4 kg)   SpO2 97%   BMI 29.66 kg/m   Wt Readings from Last 3 Encounters:  03/31/22 172 lb 12.8 oz (78.4 kg)  05/13/21 171 lb (77.6 kg)  04/14/21 171 lb 6.4 oz (77.7 kg)    BP Readings from Last 3 Encounters:  03/31/22 114/60  04/14/21 (!) 155/74  02/22/19 (!) 156/72    Physical Exam Vitals and nursing note reviewed.  Constitutional:      General: She is not in acute distress.    Appearance: Normal appearance.  HENT:     Head: Normocephalic.  Eyes:     Conjunctiva/sclera: Conjunctivae normal.  Cardiovascular:     Rate and Rhythm: Normal rate and regular rhythm.     Pulses: Normal pulses.     Heart sounds: Normal heart sounds.  Pulmonary:     Effort: Pulmonary effort is normal.     Breath sounds: Normal breath sounds.  Abdominal:     General: There is no distension.     Palpations: Abdomen is soft.     Tenderness: There is no abdominal tenderness. There is no guarding or rebound.  Musculoskeletal:     Cervical back: Normal range of motion.  Skin:    General: Skin is warm.  Neurological:     General: No focal deficit present.     Mental Status: She is alert and oriented to person, place, and time.  Psychiatric:        Mood and Affect: Mood normal.        Behavior: Behavior normal.        Thought Content: Thought content normal.        Judgment: Judgment normal.        Assessment & Plan:   Problem List Items Addressed  This Visit        Cardiovascular and Mediastinum   PAT (paroxysmal atrial tachycardia) (HCC)    Noted on Ziopatch in 2022. She is asymptomatic and not requiring treatment.       Relevant Medications   atorvastatin (LIPITOR) 20 MG tablet     Digestive   Diarrhea of presumed infectious origin - Primary    She started having diarrhea about 2 hours after eating [place] on Sunday. She has been having multiple episodes of diarrhea per day, but has been improving in the number of episodes per day. No fevers, blood in stool, or abdominal pain. She is able to keep fluids down. Does not appear dehydrated on exam. Continue eating bland foods, can add in gatorade zero to help with some electrolytes. Follow-up if symptoms worsen or don't improve.         Endocrine   Type 2 diabetes mellitus with diabetic neuropathy, without long-term current use of insulin (HCC)    Chronic, stable. Last A1c in epic is 6.5%, however she states she had labs drawn in June at her prior PCP. Will request medical records. Her blood sugars at home range 110s-120s. Continue metformin 500mg  BID and trulicity 1.5mg  injection weekly.       Relevant Medications   atorvastatin (LIPITOR) 20 MG tablet   Dulaglutide (TRULICITY) 1.5 MG/0.5ML SOPN     Musculoskeletal and Integument   Rheumatoid arthritis of right foot (HCC)    Chronic, stable. She follows with rheumatology and is taking methotrexate 2.5mg . Continue collaboration and recommendations from rheumatology.         Other   HLD (hyperlipidemia)    Chronic, ongoing. Continue atorvastatin 20mg  daily. Requesting labs and notes from prior PCP.       Relevant Medications   atorvastatin (LIPITOR) 20 MG tablet   Anxiety and depression    She has a history of anxiety and depression and is seeing a . She states this is helping significantly. Encouraged her to continue these sessions and follow up if symptoms worsen.       History of breast cancer    She has a history of right  sided breast cancer years ago. Will request prior records from her PCP.        Follow up plan: Return in about 2 months (around 05/31/2022) for 1-2 months , Diabetes, HLD.

## 2022-03-31 NOTE — Assessment & Plan Note (Signed)
Noted on Ziopatch in 2022. She is asymptomatic and not requiring treatment.

## 2022-03-31 NOTE — Assessment & Plan Note (Signed)
Chronic, ongoing. Continue atorvastatin 20mg  daily. Requesting labs and notes from prior PCP.

## 2022-04-14 ENCOUNTER — Ambulatory Visit: Payer: Medicare PPO | Attending: Cardiology | Admitting: Cardiology

## 2022-04-14 ENCOUNTER — Telehealth: Payer: Self-pay | Admitting: *Deleted

## 2022-04-14 ENCOUNTER — Encounter: Payer: Self-pay | Admitting: Cardiology

## 2022-04-14 VITALS — BP 132/70 | HR 86 | Ht 64.0 in | Wt 168.0 lb

## 2022-04-14 DIAGNOSIS — G4719 Other hypersomnia: Secondary | ICD-10-CM

## 2022-04-14 DIAGNOSIS — I471 Supraventricular tachycardia: Secondary | ICD-10-CM | POA: Diagnosis not present

## 2022-04-14 DIAGNOSIS — J984 Other disorders of lung: Secondary | ICD-10-CM | POA: Diagnosis not present

## 2022-04-14 DIAGNOSIS — R931 Abnormal findings on diagnostic imaging of heart and coronary circulation: Secondary | ICD-10-CM | POA: Diagnosis not present

## 2022-04-14 DIAGNOSIS — E7800 Pure hypercholesterolemia, unspecified: Secondary | ICD-10-CM

## 2022-04-14 DIAGNOSIS — I10 Essential (primary) hypertension: Secondary | ICD-10-CM | POA: Diagnosis not present

## 2022-04-14 DIAGNOSIS — R55 Syncope and collapse: Secondary | ICD-10-CM

## 2022-04-14 NOTE — Patient Instructions (Signed)
Medication Instructions:  Your physician recommends that you continue on your current medications as directed. Please refer to the Current Medication list given to you today.  *If you need a refill on your cardiac medications before your next appointment, please call your pharmacy*  Testing/Procedures: Your physician has requested that you have an echocardiogram. Echocardiography is a painless test that uses sound waves to create images of your heart. It provides your doctor with information about the size and shape of your heart and how well your heart's chambers and valves are working. This procedure takes approximately one hour. There are no restrictions for this procedure.  Your physician has recommended that you have a sleep study. This test records several body functions during sleep, including: brain activity, eye movement, oxygen and carbon dioxide blood levels, heart rate and rhythm, breathing rate and rhythm, the flow of air through your mouth and nose, snoring, body muscle movements, and chest and belly movement.  Follow-Up: At [facility], you and your health needs are our priority.  As part of our continuing mission to provide you with exceptional heart care, we have created designated Provider Care Teams.  These Care Teams include your primary Cardiologist (physician) and Advanced Practice Providers (APPs -  Physician Assistants and Nurse Practitioners) who all work together to provide you with the care you need, when you need it.  Your next appointment:   1 year(s)  The format for your next appointment:   In Person  Provider:   Armanda Magic, MD

## 2022-04-14 NOTE — Telephone Encounter (Signed)
Pt seen in the office today with Dr. Mayford Knife who ordered an Itamar study. Carly, RN set up the pt with the study and went over waiver. Pt agreeable to signed waiver.

## 2022-04-14 NOTE — Telephone Encounter (Signed)
DPR ok to leave message. Left message ok to proceed with Itamar study. Left message with PIN # [id number]. If could do sleep study between tonight and over the weekend so that we may keep an eye out for the results to come in.

## 2022-04-14 NOTE — Telephone Encounter (Signed)
Prior Authorization for ITAMAR sent to [employer]- via Fax  NO PA REQ 

## 2022-04-14 NOTE — Progress Notes (Signed)
Cardiology office visit note    Date:  04/14/2022   ID:  Christy [name], DOB 1941-05-06, MRN [id number]  PCP:  Christy Brunette, MD  Cardiologist:  Christy Magic, MD   Chief Complaint  Patient presents with   Follow-up    Presyncope, PAT, CAD, HTN     History of Present Illness:  Christy [name] is a 81 y.o. female with a hx of DM and pre-syncope.  She wore a ziopatch which showed brief runs of nonsustained atrial tachycardia, PACs and PVCs.  Coronary Ca score was done in July 2022 showing an elevated coronary Ca level at 1121 which is 92nd% for her age and sex matched controls.  There was also aortic atherosclerosis.  2D echo showed normal LV function with moderate pulmonary regurgitation and grade 2 diastolic dysfunction.  Carotid Doppler showed no evidence of carotid artery disease.  Lexiscan Myoview showed no ischemia.  She is here today for followup.  She has noticed that she does not have the energy that she used to have and feels tired all the time.  This had been going on for the past 6 months. She denies any chest pain or pressure, SOB, DOE, PND, orthopnea, LE edema, palpitations or presyncope or syncope. Occasionally she will have a dizzy spell but nothing recently.  She is compliant with her meds and is tolerating meds with no SE.    Past Medical History:  Diagnosis Date   Agatston coronary artery calcium score greater than 400    Ca score 1121 in July 2022   Allergy    Arthritis    Chronic constipation    Depression    Diabetes mellitus without complication (HCC)    History of breast cancer    HLD (hyperlipidemia)    PAT (paroxysmal atrial tachycardia) (HCC)    Ziopatch 02/2021   Pulmonary insufficiency    moderate by echo 05/2021   Rheumatoid arthritis (HCC)     Past Surgical History:  Procedure Laterality Date   ABDOMINAL HYSTERECTOMY     ACHILLES TENDON REPAIR     BREAST REDUCTION SURGERY Left    CATARACT EXTRACTION     masectomy Right    TONSILLECTOMY AND  ADENOIDECTOMY      Current Medications: Current Meds  Medication Sig   atorvastatin (LIPITOR) 20 MG tablet 1 tablet   Calcium Citrate 150 MG CAPS Take 1,000 mg by mouth daily.     Cholecalciferol (VITAMIN D) 1000 UNITS capsule Take 1,000 Units by mouth daily.     Dulaglutide (TRULICITY) 1.5 MG/0.5ML SOPN inject 1.5mg    folic acid (FOLVITE) 1 MG tablet Take 1 mg by mouth daily.     metFORMIN (GLUCOPHAGE) 500 MG tablet Take 500 mg by mouth 2 (two) times daily with a meal.     methotrexate (RHEUMATREX) 2.5 MG tablet Take 2.5 mg by mouth. Caution:Chemotherapy. Protect from light.    MULTIPLE VITAMIN PO Take 1 tablet by mouth daily.     omega-3 acid ethyl esters (LOVAZA) 1 g capsule Take by mouth 2 (two) times daily.   PSYLLIUM PO Take 8-10 capsules by mouth daily.      Allergies:   Epinephrine, Erythromycin, Gluten, Penicillins, and Tetracyclines & related   Social History   Socioeconomic History   Marital status: Married    Spouse name: Not on file   Number of children: 2   Years of education: Not on file   Highest education level: Not on file  Occupational History   Not  on file  Tobacco Use   Smoking status: Never   Smokeless tobacco: Never  Vaping Use   Vaping Use: Never used  Substance and Sexual Activity   Alcohol use: Not Currently   Drug use: Not Currently   Sexual activity: Not Currently  Other Topics Concern   Not on file  Social History Narrative   Not on file   Social Determinants of Health   Financial Resource Strain: Not on file  Food Insecurity: Not on file  Transportation Needs: Not on file  Physical Activity: Not on file  Stress: Not on file  Social Connections: Not on file     Family History:  The patient's family history includes Alcohol abuse in her father; Arthritis in her maternal grandmother, mother, and sister; Cancer in her father and sister; Dementia in her mother; Depression in her son; Diabetes in her son; Early death in her sister; Hearing  loss in her maternal grandmother; Heart attack in her maternal grandfather; Learning disabilities in her son; Stroke in her mother.   ROS:   Please see the history of present illness.    ROS All other systems reviewed and are negative.      No data to display             PHYSICAL EXAM:   VS:  BP 132/70   Pulse 86   Ht 5\' 4"  (1.626 m)   Wt 168 lb (76.2 kg)   SpO2 97%   BMI 28.84 kg/m    GEN: Well nourished, well developed in no acute distress HEENT: Normal NECK: No JVD; No carotid bruits LYMPHATICS: No lymphadenopathy CARDIAC:RRR, no murmurs, rubs, gallops RESPIRATORY:  Clear to auscultation without rales, wheezing or rhonchi  ABDOMEN: Soft, non-tender, non-distended MUSCULOSKELETAL:  No edema; No deformity  SKIN: Warm and dry NEUROLOGIC:  Alert and oriented x 3 PSYCHIATRIC:  Normal affect  Wt Readings from Last 3 Encounters:  04/14/22 168 lb (76.2 kg)  03/31/22 172 lb 12.8 oz (78.4 kg)  05/13/21 171 lb (77.6 kg)      Studies/Labs Reviewed:   EKG:  EKG is ordered today.  The ekg ordered today demonstrates NSR with no ST changes  Recent Labs: 07/07/2021: ALT 18   Lipid Panel    Component Value Date/Time   CHOL 168 07/07/2021 1032   TRIG 49 07/07/2021 1032   HDL 89 07/07/2021 1032   CHOLHDL 1.9 07/07/2021 1032   LDLCALC 69 07/07/2021 1032    Additional studies/ records that were reviewed today include:  Coronary Ca score, Ziopatch, OV notes from PCP, EKG      ASSESSMENT:    1. Pre-syncope   2. Agatston coronary artery calcium score greater than 400   3. Pure hypercholesterolemia   4. PAT (paroxysmal atrial tachycardia) (HCC)   5. Primary hypertension   6. Pulmonary insufficiency   7. Excessive daytime sleepiness      PLAN:  In order of problems listed above:  Pre syncope - unclear etiology - orthostatics dropped 07/09/2021 with initial standing but then normalized>> her symptoms completely resolved after stopping Ozempic - Ziopatch with  nonsustained atrial tachycardia but no post termination pauses - 2D showed normal LV function and carotid Dopplers showed no significant carotid artery stenosis  2.  Coronary artery calcium -coronary Ca score was elevated at 1121 -Lexiscan Myoview showed no ischemia 05/2021 -she denies any anginal sx  3.  HLD -LDL goal is less than 70 -I have personally reviewed and interpreted outside labs performed by  patient's PCP which showed LDL 69 and HDL 89 on 07/07/2021 -Continue prescription drug management with atorvastatin 20 mg daily with as needed refills  4.  Nonsustained atrial tachycardia -noted on ziopatch -She will mains asymptomatic  5.  White Coat HTN -BP is borderline controlled on exam today but has hx of white coat HTN  6.  Pulmonary insufficiency -Moderate by echo 05/2021 -Repeat echo 05/2022  7.  Excessive daytime sleepiness -she has to nap after lunch for 1 hour and feels tired a few hours after getting up everyday -she wakes up every 2 hours for no reason -I will get a WatchPat HST to rule out OSA  Time Spent: 20 minutes total time of encounter, including 15 minutes spent in face-to-face patient care on the date of this encounter. This time includes coordination of care and counseling regarding above mentioned problem list. Remainder of non-face-to-face time involved reviewing chart documents/testing relevant to the patient encounter and documentation in the medical record. I have independently reviewed documentation from referring provider  Medication Adjustments/Labs and Tests Ordered: Current medicines are reviewed at length with the patient today.  Concerns regarding medicines are outlined above.  Medication changes, Labs and Tests ordered today are listed in the Patient Instructions below.  Patient Instructions  Medication Instructions:  Your physician recommends that you continue on your current medications as directed. Please refer to the Current Medication list  given to you today.  *If you need a refill on your cardiac medications before your next appointment, please call your pharmacy*  Testing/Procedures: Your physician has requested that you have an echocardiogram. Echocardiography is a painless test that uses sound waves to create images of your heart. It provides your doctor with information about the size and shape of your heart and how well your heart's chambers and valves are working. This procedure takes approximately one hour. There are no restrictions for this procedure.  Your physician has recommended that you have a sleep study. This test records several body functions during sleep, including: brain activity, eye movement, oxygen and carbon dioxide blood levels, heart rate and rhythm, breathing rate and rhythm, the flow of air through your mouth and nose, snoring, body muscle movements, and chest and belly movement.  Follow-Up: At [facility], you and your health needs are our priority.  As part of our continuing mission to provide you with exceptional heart care, we have created designated Provider Care Teams.  These Care Teams include your primary Cardiologist (physician) and Advanced Practice Providers (APPs -  Physician Assistants and Nurse Practitioners) who all work together to provide you with the care you need, when you need it.  Your next appointment:   1 year(s)  The format for your next appointment:   In Person  Provider:   Armanda Magic, MD            Signed, Christy Magic, MD  04/14/2022 10:32 AM    [facility] [address] [place], [place], [place]  [address] Phone: [phone]; Fax: [phone]

## 2022-04-15 DIAGNOSIS — F4323 Adjustment disorder with mixed anxiety and depressed mood: Secondary | ICD-10-CM | POA: Diagnosis not present

## 2022-04-17 ENCOUNTER — Encounter (HOSPITAL_BASED_OUTPATIENT_CLINIC_OR_DEPARTMENT_OTHER): Payer: Medicare PPO | Admitting: Cardiology

## 2022-04-17 DIAGNOSIS — G4710 Hypersomnia, unspecified: Secondary | ICD-10-CM | POA: Diagnosis not present

## 2022-04-17 DIAGNOSIS — R931 Abnormal findings on diagnostic imaging of heart and coronary circulation: Secondary | ICD-10-CM | POA: Diagnosis not present

## 2022-04-17 DIAGNOSIS — R0683 Snoring: Secondary | ICD-10-CM

## 2022-04-19 ENCOUNTER — Ambulatory Visit: Payer: Medicare PPO | Attending: Cardiology

## 2022-04-19 DIAGNOSIS — J984 Other disorders of lung: Secondary | ICD-10-CM

## 2022-04-19 DIAGNOSIS — R931 Abnormal findings on diagnostic imaging of heart and coronary circulation: Secondary | ICD-10-CM

## 2022-04-19 DIAGNOSIS — R55 Syncope and collapse: Secondary | ICD-10-CM

## 2022-04-19 DIAGNOSIS — E7800 Pure hypercholesterolemia, unspecified: Secondary | ICD-10-CM

## 2022-04-19 DIAGNOSIS — I10 Essential (primary) hypertension: Secondary | ICD-10-CM

## 2022-04-19 DIAGNOSIS — I4719 Other supraventricular tachycardia: Secondary | ICD-10-CM

## 2022-04-19 DIAGNOSIS — G4719 Other hypersomnia: Secondary | ICD-10-CM

## 2022-04-19 DIAGNOSIS — I471 Supraventricular tachycardia: Secondary | ICD-10-CM

## 2022-04-19 NOTE — Procedures (Signed)
   Patient Information Study Date: 04/17/22 Patient Name: Christy [name] Patient ID: [id number] Birth Date: 2040/11/13 Age: 81 Gender: Female BMI: 28.6 (W=167 lb, H=5' 4'') Referring Physician: Armanda Magic, MD  TEST DESCRIPTION: Home sleep apnea testing was completed using the WatchPat, a Type 1 device, utilizing peripheral arterial tonometry (PAT), chest movement, actigraphy, pulse oximetry, pulse rate, body position and snore. AHI was calculated with apnea and hypopnea using valid sleep time as the denominator. RDI includes apneas, hypopneas, and RERAs. The data acquired and the scoring of sleep and all associated events were performed in accordance with the recommended standards and specifications as outlined in the AASM Manual for the Scoring of Sleep and Associated Events 2.2.0 (2015).   FINDINGS: 1.  No evidence of Obstructive Sleep Apnea with AHI 4.8/hr.  2.  No Central Sleep Apnea. 3.  Oxygen desaturations as low as 88%. 4.  Mild snoring was present. O2 sats were < 88% for 0 minutes. 5.  Total sleep time was 7 hrs and 57 min. 6.  5.2% of total sleep time was spent in REM sleep.  7.  Shortened sleep onset latency at 6 min.  8.  Shortened REM sleep onset latency at 66 min.  9.  Total awakenings were 7.    DIAGNOSIS:  Normal study with no significant sleep disordered breathing.  RECOMMENDATIONS:   1. Normal study with no significant sleep disordered breathing.  2.  Healthy sleep recommendations include:  adequate nightly sleep (normal 7-9 hrs/night), avoidance of caffeine after noon and alcohol near bedtime, and maintaining a sleep environment that is cool, dark and quiet.  3.  Weight loss for overweight patients is recommended.    4.  Snoring recommendations include:  weight loss where appropriate, side sleeping, and avoidance of alcohol before bed.  5.  Operation of motor vehicle or dangerous equipment must be avoided when feeling drowsy, excessively sleepy, or mentally  fatigued.    6.  An ENT consultation which may be useful for specific causes of and possible treatment of bothersome snoring.   7. Weight loss may be of benefit in reducing the severity of snoring.   Signature: Armanda Magic, MD; [facility]; Diplomat, American Board of Sleep Medicine Electronically Signed: 04/19/22

## 2022-04-22 DIAGNOSIS — R351 Nocturia: Secondary | ICD-10-CM | POA: Diagnosis not present

## 2022-04-22 DIAGNOSIS — R3915 Urgency of urination: Secondary | ICD-10-CM | POA: Diagnosis not present

## 2022-04-22 DIAGNOSIS — R278 Other lack of coordination: Secondary | ICD-10-CM | POA: Diagnosis not present

## 2022-04-23 ENCOUNTER — Telehealth: Payer: Self-pay | Admitting: *Deleted

## 2022-04-23 NOTE — Telephone Encounter (Signed)
The patient has been notified of the result. Left detailed message on voicemail and informed patient to call back..Kailey Esquilin Green, CMA   

## 2022-04-23 NOTE — Telephone Encounter (Signed)
-----   Message from Gaynelle Cage, [place] sent at 04/20/2022  2:28 PM EDT -----  ----- Message ----- From: Quintella Reichert, MD Sent: 04/19/2022   6:19 PM EDT To: Cv Div Sleep Studies  Please let patient know that sleep study showed no significant sleep apnea.

## 2022-04-28 DIAGNOSIS — F4323 Adjustment disorder with mixed anxiety and depressed mood: Secondary | ICD-10-CM | POA: Diagnosis not present

## 2022-04-30 NOTE — Telephone Encounter (Signed)
Patient called back. Pt is aware and of her normal result.

## 2022-05-09 LAB — HM DIABETES EYE EXAM

## 2022-05-10 ENCOUNTER — Encounter: Payer: Self-pay | Admitting: Nurse Practitioner

## 2022-05-10 ENCOUNTER — Ambulatory Visit (INDEPENDENT_AMBULATORY_CARE_PROVIDER_SITE_OTHER): Payer: Medicare PPO | Admitting: Nurse Practitioner

## 2022-05-10 VITALS — BP 130/66 | HR 84 | Temp 97.8°F | Wt 171.8 lb

## 2022-05-10 DIAGNOSIS — M25562 Pain in left knee: Secondary | ICD-10-CM

## 2022-05-10 DIAGNOSIS — R5383 Other fatigue: Secondary | ICD-10-CM | POA: Diagnosis not present

## 2022-05-10 DIAGNOSIS — E785 Hyperlipidemia, unspecified: Secondary | ICD-10-CM | POA: Diagnosis not present

## 2022-05-10 DIAGNOSIS — E1140 Type 2 diabetes mellitus with diabetic neuropathy, unspecified: Secondary | ICD-10-CM | POA: Diagnosis not present

## 2022-05-10 NOTE — Progress Notes (Signed)
Established Patient Office Visit  Subjective   Patient ID: Christy [name], female    DOB: 12/28/1940  Age: 81 y.o. MRN: [id number]  Chief Complaint  Patient presents with  . Follow-up     2 mo f/u DM/HLD. BS range 105-130    HPI  Christy [name] is here to follow-up on diabetes and hyperlipidemia. She has been experiencing ongoing fatigue. She states that she wakes up about every 2 hours and then goes to the bathroom. She is not sure if this is what wakes her up though. She had a sleep study which was negative for sleep apnea.   She had a fall 8 weeks ago. She states that she didn't hit the floor. Her foot caught on a chair and she strained her left knee. She is taking tylenol and using ice. She denies swelling and bruising. She states that she is not steady on her feet. Sitting makes the pain better, walking and standing make the pain worse. She would like a referral to an orthopedist.   DIABETES  Hypoglycemic episodes:no Polydipsia/polyuria: no Visual disturbance: no Chest pain: no Paresthesias: yes Glucose Monitoring: yes  Accucheck frequency: daily Taking Insulin?: no Blood Pressure Monitoring: daily - 120s/60s Retinal Examination: Up to Date Foot Exam: Not up to Date Diabetic Education: Completed Pneumovax: unknown - requesting records again from prior PCP Influenza: Not up to Date Aspirin: no     ROS See pertinent positives and negatives per HPI.    Objective:     BP 130/66   Pulse 84   Temp 97.8 F (36.6 C) (Temporal)   Wt 171 lb 12.8 oz (77.9 kg)   SpO2 97%   BMI 29.49 kg/m     Physical Exam Vitals and nursing note reviewed.  Constitutional:      General: She is not in acute distress.    Appearance: Normal appearance.  HENT:     Head: Normocephalic.  Eyes:     Conjunctiva/sclera: Conjunctivae normal.  Cardiovascular:     Rate and Rhythm: Normal rate and regular rhythm.     Pulses: Normal pulses.     Heart sounds: Normal heart sounds.   Pulmonary:     Effort: Pulmonary effort is normal.     Breath sounds: Normal breath sounds.  Musculoskeletal:        General: No swelling, tenderness or deformity. Normal range of motion.     Cervical back: Normal range of motion.     Comments: Negative anterior/posterior drawer  Skin:    General: Skin is warm.  Neurological:     General: No focal deficit present.     Mental Status: She is alert and oriented to person, place, and time.  Psychiatric:        Mood and Affect: Mood normal.        Behavior: Behavior normal.        Thought Content: Thought content normal.        Judgment: Judgment normal.     Assessment & Plan:   Problem List Items Addressed This Visit       Endocrine   Type 2 diabetes mellitus with diabetic neuropathy, without long-term current use of insulin (HCC) - Primary    Chronic, stable. Continue Trulicity 1.5mg  injection weekly and metformin 500mg  BID. Check CMP, CBC, A1c, and urine microalbumin today. She states that she gets a yearly eye exam every October. She declines the flu vaccine today and will be getting this at [facility] in the next few weeks.  Will call [facility] to get an updated vaccine record. Follow-up in 3 months.       Relevant Orders   CBC   Comprehensive metabolic panel   Hemoglobin A1c   Microalbumin / creatinine urine ratio   Vitamin B12     Other   HLD (hyperlipidemia)    Chronic, stable. Continue atorvastatin 20mg  daily and omega 3 supplement. Check lipid panel today.      Relevant Orders   CBC   Comprehensive metabolic panel   Lipid panel   Acute pain of left knee    She is having ongoing left knee pain and weakness after a near-fall and twisting her knee 8 weeks ago. She can continue tylenol and use ice as needed for pain. Referral placed to orthopedics per request.       Relevant Orders   Ambulatory referral to Orthopedic Surgery   Other Visit Diagnoses     Fatigue, unspecified type       Ongoing fatigue. Sleep study  negative. Will check CMP, CBC, TSH, and vitamin B12.    Relevant Orders   Vitamin B12   TSH      Requesting records from her prior PCP for progress notes, labs, immunizations, and screenings.   Return in about 3 months (around 08/10/2022) for Diabetes.    10/09/2022, NP

## 2022-05-10 NOTE — Assessment & Plan Note (Signed)
Chronic, stable. Continue atorvastatin 20mg  daily and omega 3 supplement. Check lipid panel today.

## 2022-05-10 NOTE — Assessment & Plan Note (Signed)
She is having ongoing left knee pain and weakness after a near-fall and twisting her knee 8 weeks ago. She can continue tylenol and use ice as needed for pain. Referral placed to orthopedics per request.

## 2022-05-10 NOTE — Assessment & Plan Note (Signed)
Chronic, stable. Continue Trulicity 1.5mg  injection weekly and metformin 500mg  BID. Check CMP, CBC, A1c, and urine microalbumin today. She states that she gets a yearly eye exam every October. She declines the flu vaccine today and will be getting this at [facility] in the next few weeks. Will call [facility] to get an updated vaccine record. Follow-up in 3 months.

## 2022-05-10 NOTE — Patient Instructions (Addendum)
It was great to see you!  We are checking your labs today and will let you know the results via mychart/phone.   I have placed a referral to orthopedics. I have attached some knee exercises that you can do daily. Keep taking the tylenol as needed.   Let's follow-up in 3 months, sooner if you have concerns.  If a referral was placed today, you will be contacted for an appointment. Please note that routine referrals can sometimes take up to 3-4 weeks to process. Please call our office if you haven't heard anything after this time frame.  Take care,  Vance Peper, NP

## 2022-05-11 ENCOUNTER — Telehealth (HOSPITAL_COMMUNITY): Payer: Self-pay | Admitting: Cardiology

## 2022-05-11 ENCOUNTER — Other Ambulatory Visit (INDEPENDENT_AMBULATORY_CARE_PROVIDER_SITE_OTHER): Payer: Medicare PPO

## 2022-05-11 DIAGNOSIS — R5383 Other fatigue: Secondary | ICD-10-CM | POA: Diagnosis not present

## 2022-05-11 LAB — LIPID PANEL
Cholesterol: 152 mg/dL (ref 0–200)
HDL: 69.5 mg/dL (ref >39.00)
LDL Cholesterol: 66 mg/dL (ref 0–99)
NonHDL: 82.08
Total CHOL/HDL Ratio: 2
Triglycerides: 78 mg/dL (ref 0.0–149.0)
VLDL: 15.6 mg/dL (ref 0.0–40.0)

## 2022-05-11 LAB — COMPREHENSIVE METABOLIC PANEL
ALT: 13 U/L (ref 0–35)
AST: 17 U/L (ref 0–37)
Albumin: 4 g/dL (ref 3.5–5.2)
Alkaline Phosphatase: 73 U/L (ref 39–117)
BUN: 19 mg/dL (ref 6–23)
CO2: 27 mEq/L (ref 19–32)
Calcium: 9.6 mg/dL (ref 8.4–10.5)
Chloride: 104 mEq/L (ref 96–112)
Creatinine, Ser: 0.93 mg/dL (ref 0.40–1.20)
GFR: 57.87 mL/min — ABNORMAL LOW (ref >60.00)
Glucose, Bld: 187 mg/dL — ABNORMAL HIGH (ref 70–99)
Potassium: 4 mEq/L (ref 3.5–5.1)
Sodium: 138 mEq/L (ref 135–145)
Total Bilirubin: 0.4 mg/dL (ref 0.2–1.2)
Total Protein: 7.1 g/dL (ref 6.0–8.3)

## 2022-05-11 LAB — CBC
HCT: 34.2 % — ABNORMAL LOW (ref 36.0–46.0)
Hemoglobin: 11.5 g/dL — ABNORMAL LOW (ref 12.0–15.0)
MCHC: 33.7 g/dL (ref 30.0–36.0)
MCV: 93.3 fl (ref 78.0–100.0)
Platelets: 240 10*3/uL (ref 150.0–400.0)
RBC: 3.67 Mil/uL — ABNORMAL LOW (ref 3.87–5.11)
RDW: 14.9 % (ref 11.5–15.5)
WBC: 7 10*3/uL (ref 4.0–10.5)

## 2022-05-11 LAB — VITAMIN B12: Vitamin B-12: 401 pg/mL (ref 211–911)

## 2022-05-11 LAB — HEMOGLOBIN A1C: Hgb A1c MFr Bld: 7.4 % — ABNORMAL HIGH (ref 4.6–6.5)

## 2022-05-11 LAB — TSH: TSH: 1.4 u[IU]/mL (ref 0.35–5.50)

## 2022-05-11 NOTE — Telephone Encounter (Signed)
Patient called and cancelled echocardiogram that was scheduled and will call back at a later date to reschedule. Order will be removed from the active echo wq and we will reinstate when patient calls back. Thank you.

## 2022-05-13 ENCOUNTER — Other Ambulatory Visit (HOSPITAL_COMMUNITY): Payer: Medicare PPO

## 2022-05-17 ENCOUNTER — Ambulatory Visit (INDEPENDENT_AMBULATORY_CARE_PROVIDER_SITE_OTHER): Payer: Medicare PPO

## 2022-05-17 ENCOUNTER — Encounter: Payer: Self-pay | Admitting: Sports Medicine

## 2022-05-17 ENCOUNTER — Other Ambulatory Visit: Payer: Self-pay | Admitting: *Deleted

## 2022-05-17 ENCOUNTER — Ambulatory Visit (INDEPENDENT_AMBULATORY_CARE_PROVIDER_SITE_OTHER): Payer: Medicare PPO | Admitting: Sports Medicine

## 2022-05-17 VITALS — Ht 64.0 in | Wt 168.0 lb

## 2022-05-17 DIAGNOSIS — M25562 Pain in left knee: Secondary | ICD-10-CM

## 2022-05-17 DIAGNOSIS — M7642 Tibial collateral bursitis [Pellegrini-Stieda], left leg: Secondary | ICD-10-CM | POA: Diagnosis not present

## 2022-05-17 DIAGNOSIS — M1712 Unilateral primary osteoarthritis, left knee: Secondary | ICD-10-CM

## 2022-05-17 MED ORDER — NAPROXEN 500 MG PO TABS: 500.0000 mg | ORAL_TABLET | Freq: Two times a day (BID) | ORAL | 0 refills | Status: AC

## 2022-05-17 MED ORDER — ATORVASTATIN CALCIUM 20 MG PO TABS: ORAL_TABLET | ORAL | 2 refills | Status: DC

## 2022-05-17 NOTE — Progress Notes (Signed)
Injury to left knee 6 weeks ago; tripped over a chair leg and fell forward grabbing on to a bench. Felt a stretching sensation Denies any swelling  Tylenol for pain; does not help much  Hurts first thing in morning or before she gets up, once up and walking the pain eases

## 2022-05-17 NOTE — Progress Notes (Signed)
Christy [name] - 81 y.o. female MRN [id number]  Date of birth: [birth year]  Office Visit Note: Visit Date: 05/17/2022 PCP: Charyl Dancer, NP Referred by: Charyl Dancer, NP  Subjective: Chief Complaint  Patient presents with   Left Knee - Pain   HPI: Christy [name] is a pleasant 81 y.o. female who presents today for left knee pain.   She had a fall about 6 weeks ago when she tripped over a chair leg and fell forward grabbing onto the bench.  She felt a stretching sensation within the knee.  She denies any swelling.  She has taken some Tylenol for pain, although it does not help much.  Pain is worse in the morning when she gets up and then when she gets going the pain eases.  Denies any previous injury to that knee in the past.  Pertinent ROS were reviewed with the patient and found to be negative unless otherwise specified above in HPI.   Assessment & Plan: Visit Diagnoses:  1. Acute pain of left knee   2. Pellegrini-Steida syndrome, left    Plan: Discussed with Tiffany possible etiology of her knee pain.  She does have underlying OA, worse in the medial compartment.  This may have been exacerbation of underlying knee OA/knee pain, but she does have calcification which does seem to be a possible Pellegrini-Stieda lesion which raises my suspicion for possible MCL sprain.  Her pain has went down from a 9/10 down to a 4-5/10 after about 6 weeks but has not gone away completely.  Her pain is more bothersome at night, we will start her on naproxen 500 mg once daily at night, may bump to twice daily if still having pain.  We did give her some home exercises for ligament stability and to offload the knee.  She will perform these once daily.  We will see how she improves over the next 3-4 weeks.  If she is not doing better, we may consider a one-time corticosteroid injection to calm down the knee joint itself.  Follow-up: Return in about 4 weeks (around 06/14/2022) for 3-4 weeks with Dr. Rolena Infante  if pain has not improved.   Meds & Orders:  Meds ordered this encounter  Medications   naproxen (NAPROSYN) 500 MG tablet    Sig: Take 1 tablet (500 mg total) by mouth 2 (two) times daily with a meal for 21 days. Take once nightly, may increase to twice a day if still having pain    Dispense:  40 tablet    Refill:  0    Orders Placed This Encounter  Procedures   XR Knee Complete 4 Views Left     Procedures: No procedures performed      Clinical History: No specialty comments available.  She reports that she has never smoked. She has never used smokeless tobacco.  Recent Labs    01/26/22 0000 05/10/22 1630  HGBA1C 6.7 7.4*    Objective:   Vital Signs: Ht 5\' 4"  (1.626 m)   Wt 168 lb (76.2 kg)   BMI 28.84 kg/m   Physical Exam  Gen: Well-appearing, in no acute distress; non-toxic CV: Regular Rate. Well-perfused. Warm.  Resp: Breathing unlabored on room air; no wheezing. Psych: Fluid speech in conversation; appropriate affect; normal thought process Neuro: Sensation intact throughout. No gross coordination deficits.   Ortho Exam - Left knee: Evaluation does not demonstrate a significant effusion, no erythema or warmth.  Motion 0-130 degrees, there is pain with endrange flexion.  There is some pain and possibly very mild laxity with valgus stress test.  No laxity or pain with varus stress testing.  Positive TTP on the medial and lateral joint line.  More so exquisite TTP over the medial femoral condyle near the MCL insertion.  Negative Lachman test.  Neurovascular intact distally.  Imaging: XR Knee Complete 4 Views Left  Result Date: 05/17/2022 4 views of the left knee including bilateral AP, Rosenberg, lateral and sunrise views were ordered and reviewed by myself.  X-rays demonstrate near bone-on-bone change of the medial compartment of the left knee.  There are calcifications of the medial aspect of the medial femoral condyle, suspicious for Pellegrini-Stieda sign.  There  is some patellar spurring and some vascular ossification noted in the popliteal fossa on lateral view.  No acute fracture noted.   Past Medical/Family/Surgical/Social History: Medications & Allergies reviewed per EMR, new medications updated. Patient Active Problem List   Diagnosis Date Noted   Acute pain of left knee 05/10/2022   Rheumatoid arthritis of right foot (HCC) 03/31/2022   Type 2 diabetes mellitus with diabetic neuropathy, without long-term current use of insulin (HCC) 03/31/2022   Anxiety and depression 03/31/2022   Diarrhea of presumed infectious origin 03/31/2022   History of breast cancer 03/31/2022   Pulmonary insufficiency 05/13/2021   PAT (paroxysmal atrial tachycardia) 04/14/2021   Agatston coronary artery calcium score greater than 400 04/14/2021   HLD (hyperlipidemia) 04/14/2021   Past Medical History:  Diagnosis Date   Agatston coronary artery calcium score greater than 400    Ca score 1121 in July 2022   Allergy    Arthritis    Chronic constipation    Depression    Diabetes mellitus without complication (HCC)    History of breast cancer    HLD (hyperlipidemia)    PAT (paroxysmal atrial tachycardia)    Ziopatch 02/2021   Pulmonary insufficiency    moderate by echo 05/2021   Rheumatoid arthritis (HCC)    Family History  Problem Relation Age of Onset   Arthritis Mother    Dementia Mother    Stroke Mother    Alcohol abuse Father    Cancer Father        lung   Arthritis Sister    Cancer Sister    Early death Sister    Learning disabilities Son    Depression Son    Diabetes Son    Arthritis Maternal Grandmother    Hearing loss Maternal Grandmother    Heart attack Maternal Grandfather    Past Surgical History:  Procedure Laterality Date   ABDOMINAL HYSTERECTOMY     ACHILLES TENDON REPAIR     BREAST REDUCTION SURGERY Left    CATARACT EXTRACTION     masectomy Right    TONSILLECTOMY AND ADENOIDECTOMY     Social History   Occupational History    Not on file  Tobacco Use   Smoking status: Never   Smokeless tobacco: Never  Vaping Use   Vaping Use: Never used  Substance and Sexual Activity   Alcohol use: Not Currently   Drug use: Not Currently   Sexual activity: Not Currently

## 2022-05-17 NOTE — Addendum Note (Signed)
Addended by: Lynnea Ferrier on: 05/17/2022 08:24 AM   Modules accepted: Orders

## 2022-05-18 DIAGNOSIS — Z6830 Body mass index (BMI) 30.0-30.9, adult: Secondary | ICD-10-CM | POA: Diagnosis not present

## 2022-05-18 DIAGNOSIS — M0579 Rheumatoid arthritis with rheumatoid factor of multiple sites without organ or systems involvement: Secondary | ICD-10-CM | POA: Diagnosis not present

## 2022-05-18 DIAGNOSIS — M1991 Primary osteoarthritis, unspecified site: Secondary | ICD-10-CM | POA: Diagnosis not present

## 2022-05-18 DIAGNOSIS — E669 Obesity, unspecified: Secondary | ICD-10-CM | POA: Diagnosis not present

## 2022-05-18 DIAGNOSIS — E559 Vitamin D deficiency, unspecified: Secondary | ICD-10-CM | POA: Diagnosis not present

## 2022-05-26 DIAGNOSIS — F4323 Adjustment disorder with mixed anxiety and depressed mood: Secondary | ICD-10-CM | POA: Diagnosis not present

## 2022-06-02 ENCOUNTER — Ambulatory Visit: Payer: Medicare PPO | Admitting: Nurse Practitioner

## 2022-06-02 DIAGNOSIS — R351 Nocturia: Secondary | ICD-10-CM | POA: Diagnosis not present

## 2022-06-02 DIAGNOSIS — R278 Other lack of coordination: Secondary | ICD-10-CM | POA: Diagnosis not present

## 2022-06-02 DIAGNOSIS — R3915 Urgency of urination: Secondary | ICD-10-CM | POA: Diagnosis not present

## 2022-06-07 ENCOUNTER — Ambulatory Visit: Payer: Medicare PPO | Admitting: Sports Medicine

## 2022-06-11 ENCOUNTER — Ambulatory Visit (HOSPITAL_COMMUNITY): Payer: Medicare PPO | Attending: Cardiology

## 2022-06-11 ENCOUNTER — Other Ambulatory Visit (HOSPITAL_COMMUNITY): Payer: Self-pay | Admitting: Cardiology

## 2022-06-11 ENCOUNTER — Encounter: Payer: Self-pay | Admitting: Cardiology

## 2022-06-11 DIAGNOSIS — I371 Nonrheumatic pulmonary valve insufficiency: Secondary | ICD-10-CM

## 2022-06-11 LAB — ECHOCARDIOGRAM COMPLETE
Area-P 1/2: 3.34 cm2
S' Lateral: 2.6 cm

## 2022-06-14 ENCOUNTER — Telehealth: Payer: Self-pay | Admitting: Cardiology

## 2022-06-14 NOTE — Telephone Encounter (Signed)
See echo results note ./cy

## 2022-06-14 NOTE — Telephone Encounter (Signed)
Patient was returning call. Please advise ?

## 2022-06-17 DIAGNOSIS — Z1231 Encounter for screening mammogram for malignant neoplasm of breast: Secondary | ICD-10-CM | POA: Diagnosis not present

## 2022-07-12 ENCOUNTER — Telehealth: Payer: Self-pay | Admitting: Nurse Practitioner

## 2022-07-12 DIAGNOSIS — E1140 Type 2 diabetes mellitus with diabetic neuropathy, unspecified: Secondary | ICD-10-CM

## 2022-07-12 MED ORDER — TRUE METRIX BLOOD GLUCOSE TEST VI STRP: 1.0000 | ORAL_STRIP | 3 refills | Status: DC

## 2022-07-12 NOTE — Telephone Encounter (Signed)
Caller Name: Payge Call back phone #: [phone]   MEDICATION(S):  Test strips for True Metrix meter Tests 1x per day  Preferred Pharmacy:  [facility] [place]  ~~~Please advise patient/caregiver to allow 2-3 business days to process RX refills.

## 2022-08-12 ENCOUNTER — Encounter: Payer: Medicare PPO | Admitting: Family Medicine

## 2022-08-19 DIAGNOSIS — H26493 Other secondary cataract, bilateral: Secondary | ICD-10-CM | POA: Diagnosis not present

## 2022-08-19 DIAGNOSIS — M0579 Rheumatoid arthritis with rheumatoid factor of multiple sites without organ or systems involvement: Secondary | ICD-10-CM | POA: Diagnosis not present

## 2022-08-19 DIAGNOSIS — H16223 Keratoconjunctivitis sicca, not specified as Sjogren's, bilateral: Secondary | ICD-10-CM | POA: Diagnosis not present

## 2022-08-19 DIAGNOSIS — H31091 Other chorioretinal scars, right eye: Secondary | ICD-10-CM | POA: Diagnosis not present

## 2022-08-19 DIAGNOSIS — H35363 Drusen (degenerative) of macula, bilateral: Secondary | ICD-10-CM | POA: Diagnosis not present

## 2022-08-24 ENCOUNTER — Encounter: Payer: Medicare PPO | Admitting: Family Medicine

## 2022-09-02 ENCOUNTER — Ambulatory Visit (INDEPENDENT_AMBULATORY_CARE_PROVIDER_SITE_OTHER): Payer: Medicare PPO | Admitting: Family Medicine

## 2022-09-02 ENCOUNTER — Encounter: Payer: Self-pay | Admitting: Family Medicine

## 2022-09-02 VITALS — BP 148/70 | HR 83 | Temp 97.9°F | Ht 64.0 in | Wt 172.2 lb

## 2022-09-02 DIAGNOSIS — R4189 Other symptoms and signs involving cognitive functions and awareness: Secondary | ICD-10-CM

## 2022-09-02 DIAGNOSIS — M25562 Pain in left knee: Secondary | ICD-10-CM | POA: Diagnosis not present

## 2022-09-02 DIAGNOSIS — G4700 Insomnia, unspecified: Secondary | ICD-10-CM

## 2022-09-02 DIAGNOSIS — H9190 Unspecified hearing loss, unspecified ear: Secondary | ICD-10-CM

## 2022-09-02 MED ORDER — MIRTAZAPINE 7.5 MG PO TABS: 7.5000 mg | ORAL_TABLET | Freq: Every day | ORAL | 1 refills | Status: DC

## 2022-09-02 NOTE — Progress Notes (Signed)
Established Patient Office Visit   Subjective:  Patient ID: Christy [name], female    DOB: May 01, 1941  Age: 82 y.o. MRN: [id number]  Chief Complaint  Patient presents with   Transitions Of Care    TOC from [place] suggestions about insomnia, no energy, memory loss testing possible     HPI Encounter Diagnoses  Name Primary?   Cognitive decline Yes   Insomnia, unspecified type    Hearing loss, unspecified hearing loss type, unspecified laterality    Acute pain of left knee    Here for above.  Tells him poor quality sleep over the last 5 years.  Typically wakes up through the night.  Has had a urology evaluation that was negative.  Recent past medical history of adjustment disorder.  She is in counseling for this.  Also has a history of presbycusis.  She is seeing an audiologist for hearing aids but they have not worked well for her.  She has been concerned about her memory.  She often has problems with word finding.  She continues to drive without getting lost.  She is able to keep up her house, pay her own bills cook and clean.  She is able to perform all of her ADLs.  She is unable to exercise much secondary to ongoing pain in her left knee.  Review of chart sounds essentially normal recent blood work.   Review of Systems  Constitutional: Negative.   HENT:  Positive for hearing loss.   Eyes:  Negative for blurred vision, discharge and redness.  Respiratory: Negative.    Cardiovascular: Negative.   Gastrointestinal:  Negative for abdominal pain.  Genitourinary: Negative.   Musculoskeletal:  Positive for joint pain. Negative for myalgias.  Skin:  Negative for rash.  Neurological:  Negative for tingling, loss of consciousness and weakness.  Endo/Heme/Allergies:  Negative for polydipsia.  Psychiatric/Behavioral:  Positive for memory loss. The patient has insomnia.      Current Outpatient Medications:    atorvastatin (LIPITOR) 20 MG tablet, 1 tablet, Disp: 90 tablet, Rfl: 2    Calcium Citrate 150 MG CAPS, Take 1,000 mg by mouth daily.  , Disp: , Rfl:    Cholecalciferol (VITAMIN D) 1000 UNITS capsule, Take 1,000 Units by mouth daily.  , Disp: , Rfl:    Dulaglutide (TRULICITY) 1.5 DD/2.2GU SOPN, inject 1.5mg , Disp: , Rfl:    folic acid (FOLVITE) 1 MG tablet, Take 1 mg by mouth daily.  , Disp: , Rfl:    glucose blood (TRUE METRIX BLOOD GLUCOSE TEST) test strip, 1 each by Other route as directed. Use as instructed, Disp: 100 each, Rfl: 3   metFORMIN (GLUCOPHAGE) 500 MG tablet, Take 500 mg by mouth 2 (two) times daily with a meal.  , Disp: , Rfl:    methotrexate (RHEUMATREX) 2.5 MG tablet, Take 2.5 mg by mouth. Caution:Chemotherapy. Protect from light. , Disp: , Rfl:    mirtazapine (REMERON) 7.5 MG tablet, Take 1 tablet (7.5 mg total) by mouth at bedtime., Disp: 30 tablet, Rfl: 1   MULTIPLE VITAMIN PO, Take 1 tablet by mouth daily.  , Disp: , Rfl:    omega-3 acid ethyl esters (LOVAZA) 1 g capsule, Take by mouth 2 (two) times daily., Disp: , Rfl:    PSYLLIUM PO, Take 8-10 capsules by mouth daily.  , Disp: , Rfl:    Objective:     BP (!) 148/70 (BP Location: Right Arm, Patient Position: Sitting, Cuff Size: Large)   Pulse 83   Temp  97.9 F (36.6 C) (Temporal)   Ht 5\' 4"  (1.626 m)   Wt 172 lb 3.2 oz (78.1 kg)   SpO2 97%   BMI 29.56 kg/m  BP Readings from Last 3 Encounters:  09/02/22 (!) 148/70  05/10/22 130/66  04/14/22 132/70   Wt Readings from Last 3 Encounters:  09/02/22 172 lb 3.2 oz (78.1 kg)  05/17/22 168 lb (76.2 kg)  05/10/22 171 lb 12.8 oz (77.9 kg)      Physical Exam Constitutional:      General: She is not in acute distress.    Appearance: Normal appearance. She is not ill-appearing, toxic-appearing or diaphoretic.  HENT:     Head: Normocephalic and atraumatic.     Right Ear: External ear normal.     Left Ear: External ear normal.     Mouth/Throat:     Mouth: Mucous membranes are moist.     Pharynx: Oropharynx is clear. No oropharyngeal  exudate or posterior oropharyngeal erythema.  Eyes:     General: No scleral icterus.       Right eye: No discharge.        Left eye: No discharge.     Extraocular Movements: Extraocular movements intact.     Conjunctiva/sclera: Conjunctivae normal.     Pupils: Pupils are equal, round, and reactive to light.  Cardiovascular:     Rate and Rhythm: Normal rate and regular rhythm.  Pulmonary:     Effort: Pulmonary effort is normal. No respiratory distress.     Breath sounds: Normal breath sounds.  Musculoskeletal:     Cervical back: No rigidity or tenderness.  Skin:    General: Skin is warm and dry.  Neurological:     Mental Status: She is alert and oriented to person, place, and time.  Psychiatric:        Mood and Affect: Mood normal.        Behavior: Behavior normal.      No results found for any visits on 09/02/22.    The ASCVD Risk score (Arnett DK, et al., 2019) failed to calculate for the following reasons:   The 2019 ASCVD risk score is only valid for ages 56 to 96    Assessment & Plan:   Cognitive decline  Insomnia, unspecified type -     Mirtazapine; Take 1 tablet (7.5 mg total) by mouth at bedtime.  Dispense: 30 tablet; Refill: 1  Hearing loss, unspecified hearing loss type, unspecified laterality -     Ambulatory referral to Audiology  Acute pain of left knee -     Ambulatory referral to Sports Medicine    Return in about 1 month (around 10/06/2022).    Libby Maw, MD Will ask audiology to reevaluate the patient and provider with amplification that works for her.  Will try mirtazapine for sleep.  Warned her to expect daytime grogginess for the first 3 to 5 days after starting this medication.  Referral back to sports medicine for reevaluation of her knee.  It actually gave way here in the clinic today.  Will consider neurology referral in the future.    Addendum: Patient's left knee gave way as she was leaving the exam table.  There was no loss  of consciousness.  She was alert and oriented.

## 2022-09-06 ENCOUNTER — Ambulatory Visit (INDEPENDENT_AMBULATORY_CARE_PROVIDER_SITE_OTHER): Payer: Medicare PPO | Admitting: Sports Medicine

## 2022-09-06 ENCOUNTER — Encounter: Payer: Self-pay | Admitting: Sports Medicine

## 2022-09-06 ENCOUNTER — Telehealth: Payer: Self-pay | Admitting: Family Medicine

## 2022-09-06 DIAGNOSIS — E1140 Type 2 diabetes mellitus with diabetic neuropathy, unspecified: Secondary | ICD-10-CM

## 2022-09-06 DIAGNOSIS — S50311A Abrasion of right elbow, initial encounter: Secondary | ICD-10-CM | POA: Diagnosis not present

## 2022-09-06 DIAGNOSIS — S8002XA Contusion of left knee, initial encounter: Secondary | ICD-10-CM

## 2022-09-06 MED ORDER — TRULICITY 0.75 MG/0.5ML ~~LOC~~ SOAJ: 0.7500 mg | SUBCUTANEOUS | 0 refills | Status: DC

## 2022-09-06 NOTE — Progress Notes (Signed)
Christy [name] - 82 y.o. female MRN [id number]  Date of birth: 04-24-1941  Office Visit Note: Visit Date: 09/06/2022 PCP: Libby Maw, MD Referred by: Libby Maw,*  Subjective: Chief Complaint  Patient presents with   Left Knee - Pain   HPI: Christy [name] is a pleasant 82 y.o. female who presents today for left knee pain.  She reports that last Thursday she was at her primary care physician and she was going to slide off of the table that she was sitting on, but it was very high to the ground.  She slid off the lateral side and fell to the ground landing on her right elbow and left side.  Feels like she tweaked her left knee some.  Over the last few days her pain has essentially completely resolved.  She was told by her PCP to get evaluated just in case. Today her pain is minimal to none. Denies any swelling of the knee. She has used ice and Tylenol.  Right elbow has a small skin abrasion - has been using band-aid and neosporin. She denies one previous fall back in Aug/Sept of 2023, which I saw her for. No other falls. Denies any loss of balance or dizziness with walking.  Pertinent ROS were reviewed with the patient and found to be negative unless otherwise specified above in HPI.   Assessment & Plan: Visit Diagnoses:  1. Contusion of left knee, initial encounter   2. Abrasion of right elbow, initial encounter    Plan: Discussed with Christy [name] her knee contusion.  She feels like this is completely back to baseline and has not been bothering her over the last few days.  Will continue with conservative care with icing as well as over-the-counter Tylenol as needed.  She does have Naprosyn that I prescribed at a previous visit that she can take with food once daily as needed with future if this does not upset her stomach.  I would like her to wait for about another 48 hours, but then began her home rehab protocol that we provided her at her last visit.  Recommend HEP once  daily.  I did offer her a referral to formalized physical therapy for gait and balance PT, however she would like to hold off on this for now but will keep this in mind if she feels more unsteady on her feet or has an additional fall.  Follow-up: Return if symptoms worsen or fail to improve.   Meds & Orders: No orders of the defined types were placed in this encounter.  No orders of the defined types were placed in this encounter.    Procedures: No procedures performed      Clinical History: No specialty comments available.  She reports that she has never smoked. She has never used smokeless tobacco.  Recent Labs    01/26/22 0000 05/10/22 1630  HGBA1C 6.7 7.4*    Objective:    Physical Exam  Gen: Well-appearing, in no acute distress; non-toxic CV: Well-perfused. Warm.  Resp: Breathing unlabored on room air; no wheezing. Psych: Fluid speech in conversation; appropriate affect; normal thought process Neuro: Sensation intact throughout. No gross coordination deficits.   Ortho Exam - Right elbow: There is a very small 1 x 1.25 cm skin abrasion over the lateral elbow.  No signs of infection, no warmth.  No bony TTP about the elbow.  Full range of motion in flexion and extension.  No bruising or swelling.  - Left knee: Inspection of  the left knee demonstrates no erythema, ecchymosis or effusion.  There is full range of motion from 0-130 degrees.  No varus or valgus instability.  5/5 strength in all directions.  Negative Lachman, negative anterior/posterior drawer. NVI.  Imaging: No results found.  Past Medical/Family/Surgical/Social History: Medications & Allergies reviewed per EMR, new medications updated. Patient Active Problem List   Diagnosis Date Noted   Cognitive decline 09/02/2022   Insomnia 09/02/2022   Hearing loss 09/02/2022   Acute pain of left knee 05/10/2022   Rheumatoid arthritis of right foot ([place]) 03/31/2022   Type 2 diabetes mellitus with diabetic  neuropathy, without long-term current use of insulin ([place]) 03/31/2022   Anxiety and depression 03/31/2022   Diarrhea of presumed infectious origin 03/31/2022   History of breast cancer 03/31/2022   Pulmonary insufficiency 05/13/2021   PAT (paroxysmal atrial tachycardia) 04/14/2021   Agatston coronary artery calcium score greater than 400 04/14/2021   HLD (hyperlipidemia) 04/14/2021   Past Medical History:  Diagnosis Date   Agatston coronary artery calcium score greater than 400    Ca score 1121 in July 2022   Allergy    Arthritis    Chronic constipation    Depression    Diabetes mellitus without complication ([place])    History of breast cancer    HLD (hyperlipidemia)    PAT (paroxysmal atrial tachycardia)    Ziopatch 02/2021   Pulmonary insufficiency    moderate by echo 11/23   Rheumatoid arthritis ([place])    Family History  Problem Relation Age of Onset   Arthritis Mother    Dementia Mother    Stroke Mother    Alcohol abuse Father    Cancer Father        lung   Arthritis Sister    Cancer Sister    Early death Sister    Learning disabilities Son    Depression Son    Diabetes Son    Arthritis Maternal Grandmother    Hearing loss Maternal Grandmother    Heart attack Maternal Grandfather    Past Surgical History:  Procedure Laterality Date   ABDOMINAL HYSTERECTOMY     ACHILLES TENDON REPAIR     BREAST REDUCTION SURGERY Left    CATARACT EXTRACTION     masectomy Right    TONSILLECTOMY AND ADENOIDECTOMY     Social History   Occupational History   Not on file  Tobacco Use   Smoking status: Never   Smokeless tobacco: Never  Vaping Use   Vaping Use: Never used  Substance and Sexual Activity   Alcohol use: Not Currently   Drug use: Not Currently   Sexual activity: Not Currently

## 2022-09-06 NOTE — Progress Notes (Signed)
Had a fall about a week or 2 ago; landed on her left knee and right elbow  States she is doing much better today; but was told by PCP to come see [place]

## 2022-09-06 NOTE — Telephone Encounter (Signed)
Caller Name: Dorcus Call back phone #: [phone]   MEDICATION(S):  Trulicity RX - pt states many pharmacies are out of stock and she has been told they don't expect the 1.5mg  dose for 2-4 weeks.  Pt requesting RX for Trulicity 0.75mg  be sent in for her in the interim.  [facility] on [address] told her they have 1 box in stock  Preferred Pharmacy:  [facility], [place] AT [facility] [address] Phone: [phone]  Fax: [phone]

## 2022-09-06 NOTE — Telephone Encounter (Signed)
Please advise message below  °

## 2022-09-09 DIAGNOSIS — F4323 Adjustment disorder with mixed anxiety and depressed mood: Secondary | ICD-10-CM | POA: Diagnosis not present

## 2022-09-10 ENCOUNTER — Ambulatory Visit (INDEPENDENT_AMBULATORY_CARE_PROVIDER_SITE_OTHER): Payer: Medicare PPO

## 2022-09-10 VITALS — Ht 64.0 in | Wt 166.0 lb

## 2022-09-10 DIAGNOSIS — Z0000 Encounter for general adult medical examination without abnormal findings: Secondary | ICD-10-CM | POA: Diagnosis not present

## 2022-09-10 NOTE — Patient Instructions (Signed)
[name] , Thank you for taking time to come for your Medicare Wellness Visit. I appreciate your ongoing commitment to your health goals. Please review the following plan we discussed and let me know if I can assist you in the future.   These are the goals we discussed:  Goals      Patient Stated     09/10/2022, wants to fit into a dress for the wedding        This is a list of the screening recommended for you and due dates:  Health Maintenance  Topic Date Due   Yearly kidney health urinalysis for diabetes  Never done   Pneumonia Vaccine (3 - PPSV23 or PCV20) 05/11/2023*   Zoster (Shingles) Vaccine (1 of 2) 08/23/2024*   Hemoglobin A1C  11/09/2022   Eye exam for diabetics  05/10/2023   Yearly kidney function blood test for diabetes  05/11/2023   Complete foot exam   05/11/2023   Medicare Annual Wellness Visit  09/11/2023   Flu Shot  Completed   DEXA scan (bone density measurement)  Completed   HPV Vaccine  Aged Out   DTaP/Tdap/Td vaccine  Discontinued   COVID-19 Vaccine  Discontinued  *Topic was postponed. The date shown is not the original due date.    Advanced directives: Please bring a copy of your POA (Power of Attorney) and/or Living Will to your next appointment.   Conditions/risks identified: none  Next appointment: Follow up in one year for your annual wellness visit    Preventive Care 65 Years and Older, Female Preventive care refers to lifestyle choices and visits with your health care provider that can promote health and wellness. What does preventive care include? A yearly physical exam. This is also called an annual well check. Dental exams once or twice a year. Routine eye exams. Ask your health care provider how often you should have your eyes checked. Personal lifestyle choices, including: Daily care of your teeth and gums. Regular physical activity. Eating a healthy diet. Avoiding tobacco and drug use. Limiting alcohol use. Practicing safe  sex. Taking low-dose aspirin every day. Taking vitamin and mineral supplements as recommended by your health care provider. What happens during an annual well check? The services and screenings done by your health care provider during your annual well check will depend on your age, overall health, lifestyle risk factors, and family history of disease. Counseling  Your health care provider may ask you questions about your: Alcohol use. Tobacco use. Drug use. Emotional well-being. Home and relationship well-being. Sexual activity. Eating habits. History of falls. Memory and ability to understand (cognition). Work and work Statistician. Reproductive health. Screening  You may have the following tests or measurements: Height, weight, and BMI. Blood pressure. Lipid and cholesterol levels. These may be checked every 5 years, or more frequently if you are over 65 years old. Skin check. Lung cancer screening. You may have this screening every year starting at age 73 if you have a 30-pack-year history of smoking and currently smoke or have quit within the past 15 years. Fecal occult blood test (FOBT) of the stool. You may have this test every year starting at age 32. Flexible sigmoidoscopy or colonoscopy. You may have a sigmoidoscopy every 5 years or a colonoscopy every 10 years starting at age 82. Hepatitis C blood test. Hepatitis B blood test. Sexually transmitted disease (STD) testing. Diabetes screening. This is done by checking your blood sugar (glucose) after you have not eaten for a while (  fasting). You may have this done every 1-3 years. Bone density scan. This is done to screen for osteoporosis. You may have this done starting at age 5. Mammogram. This may be done every 1-2 years. Talk to your health care provider about how often you should have regular mammograms. Talk with your health care provider about your test results, treatment options, and if necessary, the need for more  tests. Vaccines  Your health care provider may recommend certain vaccines, such as: Influenza vaccine. This is recommended every year. Tetanus, diphtheria, and acellular pertussis (Tdap, Td) vaccine. You may need a Td booster every 10 years. Zoster vaccine. You may need this after age 59. Pneumococcal 13-valent conjugate (PCV13) vaccine. One dose is recommended after age 64. Pneumococcal polysaccharide (PPSV23) vaccine. One dose is recommended after age 74. Talk to your health care provider about which screenings and vaccines you need and how often you need them. This information is not intended to replace advice given to you by your health care provider. Make sure you discuss any questions you have with your health care provider. Document Released: 08/22/2015 Document Revised: 04/14/2016 Document Reviewed: 05/27/2015 Elsevier Interactive Patient Education  2017 [place] Prevention in the Home Falls can cause injuries. They can happen to people of all ages. There are many things you can do to make your home safe and to help prevent falls. What can I do on the outside of my home? Regularly fix the edges of walkways and driveways and fix any cracks. Remove anything that might make you trip as you walk through a door, such as a raised step or threshold. Trim any bushes or trees on the path to your home. Use bright outdoor lighting. Clear any walking paths of anything that might make someone trip, such as rocks or tools. Regularly check to see if handrails are loose or broken. Make sure that both sides of any steps have handrails. Any raised decks and porches should have guardrails on the edges. Have any leaves, snow, or ice cleared regularly. Use sand or salt on walking paths during winter. Clean up any spills in your garage right away. This includes oil or grease spills. What can I do in the bathroom? Use night lights. Install grab bars by the toilet and in the tub and shower.  Do not use towel bars as grab bars. Use non-skid mats or decals in the tub or shower. If you need to sit down in the shower, use a plastic, non-slip stool. Keep the floor dry. Clean up any water that spills on the floor as soon as it happens. Remove soap buildup in the tub or shower regularly. Attach bath mats securely with double-sided non-slip rug tape. Do not have throw rugs and other things on the floor that can make you trip. What can I do in the bedroom? Use night lights. Make sure that you have a light by your bed that is easy to reach. Do not use any sheets or blankets that are too big for your bed. They should not hang down onto the floor. Have a firm chair that has side arms. You can use this for support while you get dressed. Do not have throw rugs and other things on the floor that can make you trip. What can I do in the kitchen? Clean up any spills right away. Avoid walking on wet floors. Keep items that you use a lot in easy-to-reach places. If you need to reach something above you, use  a strong step stool that has a grab bar. Keep electrical cords out of the way. Do not use floor polish or wax that makes floors slippery. If you must use wax, use non-skid floor wax. Do not have throw rugs and other things on the floor that can make you trip. What can I do with my stairs? Do not leave any items on the stairs. Make sure that there are handrails on both sides of the stairs and use them. Fix handrails that are broken or loose. Make sure that handrails are as long as the stairways. Check any carpeting to make sure that it is firmly attached to the stairs. Fix any carpet that is loose or worn. Avoid having throw rugs at the top or bottom of the stairs. If you do have throw rugs, attach them to the floor with carpet tape. Make sure that you have a light switch at the top of the stairs and the bottom of the stairs. If you do not have them, ask someone to add them for you. What else  can I do to help prevent falls? Wear shoes that: Do not have high heels. Have rubber bottoms. Are comfortable and fit you well. Are closed at the toe. Do not wear sandals. If you use a stepladder: Make sure that it is fully opened. Do not climb a closed stepladder. Make sure that both sides of the stepladder are locked into place. Ask someone to hold it for you, if possible. Clearly mark and make sure that you can see: Any grab bars or handrails. First and last steps. Where the edge of each step is. Use tools that help you move around (mobility aids) if they are needed. These include: Canes. Walkers. Scooters. Crutches. Turn on the lights when you go into a dark area. Replace any light bulbs as soon as they burn out. Set up your furniture so you have a clear path. Avoid moving your furniture around. If any of your floors are uneven, fix them. If there are any pets around you, be aware of where they are. Review your medicines with your doctor. Some medicines can make you feel dizzy. This can increase your chance of falling. Ask your doctor what other things that you can do to help prevent falls. This information is not intended to replace advice given to you by your health care provider. Make sure you discuss any questions you have with your health care provider. Document Released: 05/22/2009 Document Revised: 01/01/2016 Document Reviewed: 08/30/2014 Elsevier Interactive Patient Education  2017 [employer].

## 2022-09-10 NOTE — Progress Notes (Signed)
I connected with Christy [name] today by telephone and verified that I am speaking with the correct person using two identifiers. Location patient: home Location provider: work Persons participating in the virtual visit: Meygan, Kyser LPN.   I discussed the limitations, risks, security and privacy concerns of performing an evaluation and management service by telephone and the availability of in person appointments. I also discussed with the patient that there may be a patient responsible charge related to this service. The patient expressed understanding and verbally consented to this telephonic visit.    Interactive audio and video telecommunications were attempted between this provider and patient, however failed, due to patient having technical difficulties OR patient did not have access to video capability.  We continued and completed visit with audio only.     Vital signs may be patient reported or missing.  Subjective:   Christy [name] is a 82 y.o. female who presents for an Initial Medicare Annual Wellness Visit.  Review of Systems     Cardiac Risk Factors include: advanced age (>52men, >19 women);diabetes mellitus;dyslipidemia     Objective:    Today's Vitals   09/10/22 1258  Weight: 166 lb (75.3 kg)  Height: 5\' 4"  (1.626 m)   Body mass index is 28.49 kg/m.     09/10/2022    1:03 PM  Advanced Directives  Does Patient Have a Medical Advance Directive? Yes  Type of Paramedic of [place];Living will  Copy of [place] in Chart? No - copy requested    Current Medications (verified) Outpatient Encounter Medications as of 09/10/2022  Medication Sig   atorvastatin (LIPITOR) 20 MG tablet 1 tablet   Calcium Citrate 150 MG CAPS Take 1,000 mg by mouth daily.     Cholecalciferol (VITAMIN D) 1000 UNITS capsule Take 1,000 Units by mouth daily.     Dulaglutide (TRULICITY) 1.5 ZO/1.0RU SOPN inject 1.5mg    folic acid  (FOLVITE) 1 MG tablet Take 1 mg by mouth daily.     glucose blood (TRUE METRIX BLOOD GLUCOSE TEST) test strip 1 each by Other route as directed. Use as instructed   metFORMIN (GLUCOPHAGE) 500 MG tablet Take 500 mg by mouth 2 (two) times daily with a meal.     methotrexate (RHEUMATREX) 2.5 MG tablet Take 2.5 mg by mouth. Caution:Chemotherapy. Protect from light.    MULTIPLE VITAMIN PO Take 1 tablet by mouth daily.     omega-3 acid ethyl esters (LOVAZA) 1 g capsule Take by mouth 2 (two) times daily.   PSYLLIUM PO Take 8-10 capsules by mouth daily.     Dulaglutide (TRULICITY) 0.45 WU/9.8JX SOPN Inject 0.75 mg into the skin once a week.   mirtazapine (REMERON) 7.5 MG tablet Take 1 tablet (7.5 mg total) by mouth at bedtime. (Patient not taking: Reported on 09/10/2022)   No facility-administered encounter medications on file as of 09/10/2022.    Allergies (verified) Epinephrine, Erythromycin, Gluten, Penicillins, and Tetracyclines & related   History: Past Medical History:  Diagnosis Date   Agatston coronary artery calcium score greater than 400    Ca score 1121 in July 2022   Allergy    Arthritis    Chronic constipation    Depression    Diabetes mellitus without complication ([place])    History of breast cancer    HLD (hyperlipidemia)    PAT (paroxysmal atrial tachycardia)    [place] 02/2021   Pulmonary insufficiency    moderate by echo 11/23   Rheumatoid arthritis (  HCC)    Past Surgical History:  Procedure Laterality Date   ABDOMINAL HYSTERECTOMY     ACHILLES TENDON REPAIR     BREAST REDUCTION SURGERY Left    CATARACT EXTRACTION     masectomy Right    TONSILLECTOMY AND ADENOIDECTOMY     Family History  Problem Relation Age of Onset   Arthritis Mother    Dementia Mother    Stroke Mother    Alcohol abuse Father    Cancer Father        lung   Arthritis Sister    Cancer Sister    Early death Sister    Learning disabilities Son    Depression Son    Diabetes Son    Arthritis  Maternal Grandmother    Hearing loss Maternal Grandmother    Heart attack Maternal Grandfather    Social History   Socioeconomic History   Marital status: Married    Spouse name: Not on file   Number of children: 2   Years of education: Not on file   Highest education level: Not on file  Occupational History   Not on file  Tobacco Use   Smoking status: Never   Smokeless tobacco: Never  Vaping Use   Vaping Use: Never used  Substance and Sexual Activity   Alcohol use: Not Currently   Drug use: Not Currently   Sexual activity: Not Currently  Other Topics Concern   Not on file  Social History Narrative   Not on file   Social Determinants of Health   Financial Resource Strain: Low Risk  (09/10/2022)   Overall Financial Resource Strain (CARDIA)    Difficulty of Paying Living Expenses: Not hard at all  Food Insecurity: No Food Insecurity (09/10/2022)   Hunger Vital Sign    Worried About Running Out of Food in the Last Year: Never true    Ran Out of Food in the Last Year: Never true  Transportation Needs: No Transportation Needs (09/10/2022)   PRAPARE - Administrator, Civil Service (Medical): No    Lack of Transportation (Non-Medical): No  Physical Activity: Insufficiently Active (09/10/2022)   Exercise Vital Sign    Days of Exercise per Week: 2 days    Minutes of Exercise per Session: 20 min  Stress: Stress Concern Present (09/10/2022)   [employer] of Occupational Health - Occupational Stress Questionnaire    Feeling of Stress : To some extent  Social Connections: Not on file    Tobacco Counseling Counseling given: Not Answered   Clinical Intake:  Pre-visit preparation completed: Yes  Pain : No/denies pain     Nutritional Status: BMI 25 -29 Overweight Nutritional Risks: None Diabetes: Yes  How often do you need to have someone help you when you read instructions, pamphlets, or other written materials from your doctor or pharmacy?: 2 -  Rarely  Diabetic? Yes Nutrition Risk Assessment:  Has the patient had any N/V/D within the last 2 months?  No  Does the patient have any non-healing wounds?  No  Has the patient had any unintentional weight loss or weight gain?  No   Diabetes:  Is the patient diabetic?  Yes  If diabetic, was a CBG obtained today?  No  Did the patient bring in their glucometer from home?  No  How often do you monitor your CBG's? daily.   Financial Strains and Diabetes Management:  Are you having any financial strains with the device, your supplies or your medication? No .  Does the patient want to be seen by Chronic Care Management for management of their diabetes?  No  Would the patient like to be referred to a Nutritionist or for Diabetic Management?  No   Diabetic Exams:  Diabetic Eye Exam: Completed 05/09/2022 Diabetic Foot Exam: Completed 05/10/2022   Interpreter Needed?: No  Information entered by :: NAllen LPN   Activities of Daily Living    09/10/2022    1:08 PM 09/09/2022    6:18 PM  In your present state of health, do you have any difficulty performing the following activities:  Hearing? 1 0  Vision? 0 0  Difficulty concentrating or making decisions? 1 1  Walking or climbing stairs? 0 0  Dressing or bathing? 0 0  Doing errands, shopping? 0 0  Preparing Food and eating ? N N  Using the Toilet? N N  In the past six months, have you accidently leaked urine? Y Y  Do you have problems with loss of bowel control? N N  Managing your Medications? N N  Managing your Finances? N N  Housekeeping or managing your Housekeeping? N N    Patient Care Team: Mliss Sax, MD as PCP - General (Family Medicine) Quintella Reichert, MD as PCP - Cardiology (Cardiology)  Indicate any recent Medical Services you may have received from other than [facility] providers in the past year (date may be approximate).     Assessment:   This is a routine wellness examination for  Christy [name].  Hearing/Vision screen Vision Screening - Comments:: Regular eye exams, [facility]  Dietary issues and exercise activities discussed: Current Exercise Habits: Home exercise routine, Type of exercise: Other - see comments (stationary bike), Time (Minutes): 20, Frequency (Times/Week): 2, Weekly Exercise (Minutes/Week): 40   Goals Addressed             This Visit's Progress    Patient Stated       09/10/2022, wants to fit into a dress for the wedding       Depression Screen    09/10/2022    1:05 PM 09/02/2022    3:02 PM 09/02/2022    2:04 PM 03/31/2022    8:20 AM 02/22/2019    2:07 PM  PHQ 2/9 Scores  PHQ - 2 Score 0 0 0 0 0  PHQ- 9 Score  9       Fall Risk    09/10/2022    1:03 PM 09/09/2022    6:18 PM 09/02/2022    2:04 PM 03/31/2022    8:15 AM 02/22/2019    2:07 PM  Fall Risk   Falls in the past year? 1 1 0 1 0  Comment tripped over a chair      Number falls in past yr: 0 1 0 0   Injury with Fall? 0 1 0 1   Risk for fall due to : Medication side effect  No Fall Risks No Fall Risks   Follow up Falls evaluation completed;Education provided;Falls prevention discussed  Falls evaluation completed      FALL RISK PREVENTION PERTAINING TO THE HOME:  Any stairs in or around the home? No  If so, are there any without handrails? N/a Home free of loose throw rugs in walkways, pet beds, electrical cords, etc? Yes  Adequate lighting in your home to reduce risk of falls? Yes   ASSISTIVE DEVICES UTILIZED TO PREVENT FALLS:  Life alert? No  Use of a cane, walker or w/c? No  Grab bars  in the bathroom? Yes  Shower chair or bench in shower? Yes  Elevated toilet seat or a handicapped toilet? Yes   TIMED UP AND GO:  Was the test performed? No .      Cognitive Function:        09/10/2022    1:09 PM  6CIT Screen  What Year? 0 points  What month? 0 points  What time? 0 points  Count back from 20 0 points  Months in reverse 0 points  Repeat phrase 0 points  Total  Score 0 points    Immunizations Immunization History  Administered Date(s) Administered   DTaP 03/03/2011   Influenza Split 05/23/2012, 05/09/2014, 06/13/2015   Influenza, High Dose Seasonal PF 06/20/2022   Influenza, Quadrivalent, Recombinant, Inj, Pf 06/29/2019, 07/11/2020   Influenza,inj,Quad PF,6+ Mos 06/04/2017   PFIZER(Purple Top)SARS-COV-2 Vaccination 08/29/2019, 09/29/2019, 09/30/2019   Pneumococcal Conjugate-13 06/01/2006   Pneumococcal Polysaccharide-23 07/16/2005, 07/16/2005   Tdap 03/03/2011    TDAP status: Due, Education has been provided regarding the importance of this vaccine. Advised may receive this vaccine at local pharmacy or Health Dept. Aware to provide a copy of the vaccination record if obtained from local pharmacy or Health Dept. Verbalized acceptance and understanding.  Flu Vaccine status: Up to date  Pneumococcal vaccine status: Up to date  Covid-19 vaccine status: Completed vaccines  Qualifies for Shingles Vaccine? Yes   Zostavax completed No   Shingrix Completed?: No.    Education has been provided regarding the importance of this vaccine. Patient has been advised to call insurance company to determine out of pocket expense if they have not yet received this vaccine. Advised may also receive vaccine at local pharmacy or Health Dept. Verbalized acceptance and understanding.  Screening Tests Health Maintenance  Topic Date Due   Diabetic kidney evaluation - Urine ACR  Never done   Medicare Annual Wellness (AWV)  07/21/2018   Pneumonia Vaccine 58+ Years old (72 - PPSV23 or PCV20) 05/11/2023 (Originally 06/02/2011)   Zoster Vaccines- Shingrix (1 of 2) 08/23/2024 (Originally 06/01/1991)   HEMOGLOBIN A1C  11/09/2022   OPHTHALMOLOGY EXAM  05/10/2023   Diabetic kidney evaluation - eGFR measurement  05/11/2023   FOOT EXAM  05/11/2023   INFLUENZA VACCINE  Completed   DEXA SCAN  Completed   HPV VACCINES  Aged Out   DTaP/Tdap/Td  Discontinued   COVID-19  Vaccine  Discontinued    Health Maintenance  Health Maintenance Due  Topic Date Due   Diabetic kidney evaluation - Urine ACR  Never done   Medicare Annual Wellness (AWV)  07/21/2018    Colorectal cancer screening: No longer required.   Mammogram status: Completed 2023. Repeat every year  Bone Density status: Completed 05/06/2016.   Lung Cancer Screening: (Low Dose CT Chest recommended if Age 68-80 years, 30 pack-year currently smoking OR have quit w/in 15years.) does not qualify.   Lung Cancer Screening Referral: no  Additional Screening:  Hepatitis C Screening: does not qualify;   Vision Screening: Recommended annual ophthalmology exams for early detection of glaucoma and other disorders of the eye. Is the patient up to date with their annual eye exam?  Yes  Who is the provider or what is the name of the office in which the patient attends annual eye exams? [facility] If pt is not established with a provider, would they like to be referred to a provider to establish care? No .   Dental Screening: Recommended annual dental exams for proper oral hygiene  Community Resource Referral / Chronic Care Management: CRR required this visit?  No   CCM required this visit?  No      Plan:     I have personally reviewed and noted the following in the patient's chart:   Medical and social history Use of alcohol, tobacco or illicit drugs  Current medications and supplements including opioid prescriptions. Patient is not currently taking opioid prescriptions. Functional ability and status Nutritional status Physical activity Advanced directives List of other physicians Hospitalizations, surgeries, and ER visits in previous 12 months Vitals Screenings to include cognitive, depression, and falls Referrals and appointments  In addition, I have reviewed and discussed with patient certain preventive protocols, quality metrics, and best practice recommendations. A written  personalized care plan for preventive services as well as general preventive health recommendations were provided to patient.     Kellie Simmering, LPN   01/09/9527   Nurse Notes: none  Due to this being a virtual visit, the after visit summary with patients personalized plan was offered to patient via mail or my-chart. Patient would like to access on my-chart

## 2022-09-20 ENCOUNTER — Ambulatory Visit: Payer: Medicare PPO | Admitting: Audiologist

## 2022-09-21 DIAGNOSIS — M0579 Rheumatoid arthritis with rheumatoid factor of multiple sites without organ or systems involvement: Secondary | ICD-10-CM | POA: Diagnosis not present

## 2022-09-21 DIAGNOSIS — E559 Vitamin D deficiency, unspecified: Secondary | ICD-10-CM | POA: Diagnosis not present

## 2022-09-21 DIAGNOSIS — M1991 Primary osteoarthritis, unspecified site: Secondary | ICD-10-CM | POA: Diagnosis not present

## 2022-09-21 DIAGNOSIS — Z6829 Body mass index (BMI) 29.0-29.9, adult: Secondary | ICD-10-CM | POA: Diagnosis not present

## 2022-09-21 DIAGNOSIS — R5383 Other fatigue: Secondary | ICD-10-CM | POA: Diagnosis not present

## 2022-09-21 DIAGNOSIS — E663 Overweight: Secondary | ICD-10-CM | POA: Diagnosis not present

## 2022-09-27 ENCOUNTER — Encounter: Payer: Self-pay | Admitting: Nurse Practitioner

## 2022-09-27 ENCOUNTER — Ambulatory Visit (INDEPENDENT_AMBULATORY_CARE_PROVIDER_SITE_OTHER): Payer: Medicare PPO | Admitting: Nurse Practitioner

## 2022-09-27 VITALS — BP 124/70 | HR 92 | Temp 97.6°F | Ht 64.0 in | Wt 170.6 lb

## 2022-09-27 DIAGNOSIS — R220 Localized swelling, mass and lump, head: Secondary | ICD-10-CM | POA: Diagnosis not present

## 2022-09-27 DIAGNOSIS — E1140 Type 2 diabetes mellitus with diabetic neuropathy, unspecified: Secondary | ICD-10-CM

## 2022-09-27 MED ORDER — TRIAMCINOLONE ACETONIDE 0.1 % EX CREA: 1.0000 | TOPICAL_CREAM | Freq: Two times a day (BID) | CUTANEOUS | 0 refills | Status: DC

## 2022-09-27 MED ORDER — RYBELSUS 7 MG PO TABS: 7.0000 mg | ORAL_TABLET | Freq: Every day | ORAL | 0 refills | Status: DC

## 2022-09-27 MED ORDER — RYBELSUS 3 MG PO TABS: 3.0000 mg | ORAL_TABLET | Freq: Every day | ORAL | 0 refills | Status: DC

## 2022-09-27 MED ORDER — VALACYCLOVIR HCL 1 G PO TABS: 1000.0000 mg | ORAL_TABLET | Freq: Three times a day (TID) | ORAL | 0 refills | Status: AC

## 2022-09-27 NOTE — Progress Notes (Unsigned)
Acute Office Visit  Subjective:     Patient ID: Christy [name], female    DOB: 09/18/1940, 82 y.o.   MRN: [id number]  Chief Complaint  Patient presents with   Allergic Reaction    Left side of lip swollen, left side of face, and left side of throat-started yesterday afternoon    HPI Patient is in today for swelling to the left upper lip, left side of her face, and pain to her left side of her neck.  She states that her lip is tender to touch.  She states that she woke up with it swollen yesterday morning.  She states that Benadryl does not usually work for her and did not try this.  It has gotten smaller over the past few hours.  She states the only thing new is a cream that had SPF that she was using to her entire face.  She denies itching, shortness of breath, chest pain.  She also notes that she has been having trouble getting her Trulicity injections.  She has called multiple pharmacies and they do not have this in stock.  She is wondering if there is a different option as she has her last injection tomorrow.  ROS See pertinent positives and negatives per HPI.     Objective:    BP 124/70 (BP Location: Right Arm)   Pulse 92   Temp 97.6 F (36.4 C)   Ht 5' 4"$  (1.626 m)   Wt 170 lb 9.6 oz (77.4 kg)   SpO2 97%   BMI 29.28 kg/m    Physical Exam Vitals and nursing note reviewed.  Constitutional:      General: She is not in acute distress.    Appearance: Normal appearance.  HENT:     Head: Normocephalic.     Mouth/Throat:     Comments: Left upper lip swollen, small vesicles to area Eyes:     Conjunctiva/sclera: Conjunctivae normal.  Cardiovascular:     Rate and Rhythm: Normal rate and regular rhythm.     Pulses: Normal pulses.     Heart sounds: Normal heart sounds.  Pulmonary:     Effort: Pulmonary effort is normal.     Breath sounds: Normal breath sounds.  Musculoskeletal:     Cervical back: Normal range of motion. Tenderness present.  Skin:    General: Skin is  warm.  Neurological:     General: No focal deficit present.     Mental Status: She is alert and oriented to person, place, and time.  Psychiatric:        Mood and Affect: Mood normal.        Behavior: Behavior normal.        Thought Content: Thought content normal.        Judgment: Judgment normal.       Assessment & Plan:   Problem List Items Addressed This Visit       Endocrine   Type 2 diabetes mellitus with diabetic neuropathy, without long-term current use of insulin ([place])    She is having hard time getting her Trulicity injections as they have been on backorder at multiple pharmacies.  She has one injection left tomorrow.  Will have her switch to Rybelsus 3 mg daily for 30 days and then increase to 7 mg daily.  She will start this 1 week after her last trulicity injection. Encouraged her to keep follow-up appointments with her PCP.      Relevant Medications   Semaglutide (RYBELSUS) 3 MG  TABS   Semaglutide (RYBELSUS) 7 MG TABS (Start on 10/26/2022)     Other   Lip swelling - Primary    Left upper lip swollen with small blisters located to it.  Concern for allergic reaction versus shingles versus HSV.  Will treat with Valtrex 1000 mg 3 times daily x 7 days and she can use a hydrocortisone cream on the area twice a day.  No red flags on exam, no trouble breathing. Stop using the new cream on her face.  Follow-up if symptoms worsen or any concerns.       Meds ordered this encounter  Medications   Semaglutide (RYBELSUS) 3 MG TABS    Sig: Take 1 tablet (3 mg total) by mouth daily.    Dispense:  30 tablet    Refill:  0   Semaglutide (RYBELSUS) 7 MG TABS    Sig: Take 1 tablet (7 mg total) by mouth daily.    Dispense:  90 tablet    Refill:  0   valACYclovir (VALTREX) 1000 MG tablet    Sig: Take 1 tablet (1,000 mg total) by mouth 3 (three) times daily for 7 days.    Dispense:  21 tablet    Refill:  0   triamcinolone cream (KENALOG) 0.1 %    Sig: Apply 1 Application  topically 2 (two) times daily.    Dispense:  30 g    Refill:  0    Return if symptoms worsen or fail to improve.  Charyl Dancer, NP

## 2022-09-27 NOTE — Patient Instructions (Signed)
It was great to see you!  Start rybelsus 68m 1 tablet daily for 30 days then increase to 719mdaily. Start this 1 week after your last trulicity dose.   Start valtrex 1 tablet 3 times a day for 7 days and use triamcinolone cream twice a day as needed to the blisters.   Start unisom over the counter to help with sleep  Let's follow-up if your symptoms worsen or don't improve.   Take care,  LaVance PeperNP

## 2022-09-28 ENCOUNTER — Encounter: Payer: Self-pay | Admitting: Nurse Practitioner

## 2022-09-28 DIAGNOSIS — R220 Localized swelling, mass and lump, head: Secondary | ICD-10-CM | POA: Insufficient documentation

## 2022-09-28 NOTE — Assessment & Plan Note (Addendum)
Left upper lip swollen with small blisters located to it.  Concern for allergic reaction versus shingles versus HSV.  Will treat with Valtrex 1000 mg 3 times daily x 7 days and she can use a hydrocortisone cream on the area twice a day.  No red flags on exam, no trouble breathing. Stop using the new cream on her face.  Follow-up if symptoms worsen or any concerns.

## 2022-09-28 NOTE — Assessment & Plan Note (Signed)
She is having hard time getting her Trulicity injections as they have been on backorder at multiple pharmacies.  She has one injection left tomorrow.  Will have her switch to Rybelsus 3 mg daily for 30 days and then increase to 7 mg daily.  She will start this 1 week after her last trulicity injection. Encouraged her to keep follow-up appointments with her PCP.

## 2022-10-04 ENCOUNTER — Ambulatory Visit (INDEPENDENT_AMBULATORY_CARE_PROVIDER_SITE_OTHER): Payer: Medicare PPO | Admitting: Family Medicine

## 2022-10-04 ENCOUNTER — Encounter: Payer: Self-pay | Admitting: Family Medicine

## 2022-10-04 VITALS — BP 136/68 | HR 89 | Temp 97.8°F | Ht 64.0 in | Wt 169.0 lb

## 2022-10-04 DIAGNOSIS — G4700 Insomnia, unspecified: Secondary | ICD-10-CM

## 2022-10-04 NOTE — Progress Notes (Signed)
Established Patient Office Visit   Subjective:  Patient ID: Christy [name], female    DOB: July 16, 1941  Age: 82 y.o. MRN: [id number]  Chief Complaint  Patient presents with   Insomnia    1 month follow up on insomnia, no improvement still very restless, how long should pt take cream for swollen lip.     Insomnia PMH includes: no depression.    Encounter Diagnoses  Name Primary?   Insomnia, unspecified type Yes   For follow-up of insomnia.  Ongoing therapy for adjustment disorder secondary to relationship problems at home with her husband.  Things are better now.  Denies any current issues with depression or anxiety.  Her issues are longstanding and insomnia.  She had been reluctant to start the mirtazapine written for at last visit secondary to a prior history of weight gain with it.  At that time she was taking 15 mg tablet.  She did not follow-up with sports medicine after her knee gave way in the office last visit.   Review of Systems  Constitutional: Negative.   HENT: Negative.    Eyes:  Negative for blurred vision, discharge and redness.  Respiratory: Negative.    Cardiovascular: Negative.   Gastrointestinal:  Negative for abdominal pain.  Genitourinary: Negative.   Musculoskeletal: Negative.  Negative for myalgias.  Skin:  Negative for rash.  Neurological:  Negative for tingling, loss of consciousness and weakness.  Endo/Heme/Allergies:  Negative for polydipsia.  Psychiatric/Behavioral:  Negative for depression. The patient has insomnia. The patient is not nervous/anxious.       10/04/2022    1:15 PM 09/10/2022    1:05 PM 09/02/2022    3:02 PM  Depression screen PHQ 2/9  Decreased Interest 0 0 0  Down, Depressed, Hopeless 0 0 0  PHQ - 2 Score 0 0 0  Altered sleeping   3  Tired, decreased energy   3  Change in appetite   2  Feeling bad or failure about yourself    1  Trouble concentrating   0  Moving slowly or fidgety/restless   0  Suicidal thoughts   0  PHQ-9  Score   9  Difficult doing work/chores   Not difficult at all      Current Outpatient Medications:    atorvastatin (LIPITOR) 20 MG tablet, 1 tablet, Disp: 90 tablet, Rfl: 2   Calcium Citrate 150 MG CAPS, Take 1,000 mg by mouth daily.  , Disp: , Rfl:    Cholecalciferol (VITAMIN D) 1000 UNITS capsule, Take 1,000 Units by mouth daily.  , Disp: , Rfl:    folic acid (FOLVITE) 1 MG tablet, Take 1 mg by mouth daily.  , Disp: , Rfl:    glucose blood (TRUE METRIX BLOOD GLUCOSE TEST) test strip, 1 each by Other route as directed. Use as instructed, Disp: 100 each, Rfl: 3   metFORMIN (GLUCOPHAGE) 500 MG tablet, Take 500 mg by mouth 2 (two) times daily with a meal.  , Disp: , Rfl:    methotrexate (RHEUMATREX) 2.5 MG tablet, Take 2.5 mg by mouth. Caution:Chemotherapy. Protect from light. , Disp: , Rfl:    MULTIPLE VITAMIN PO, Take 1 tablet by mouth daily.  , Disp: , Rfl:    omega-3 acid ethyl esters (LOVAZA) 1 g capsule, Take by mouth 2 (two) times daily., Disp: , Rfl:    PSYLLIUM PO, Take 8-10 capsules by mouth daily.  , Disp: , Rfl:    Semaglutide (RYBELSUS) 3 MG TABS, Take 1 tablet (  3 mg total) by mouth daily., Disp: 30 tablet, Rfl: 0   [START ON 10/26/2022] Semaglutide (RYBELSUS) 7 MG TABS, Take 1 tablet (7 mg total) by mouth daily., Disp: 90 tablet, Rfl: 0   triamcinolone cream (KENALOG) 0.1 %, Apply 1 Application topically 2 (two) times daily., Disp: 30 g, Rfl: 0   valACYclovir (VALTREX) 1000 MG tablet, Take 1 tablet (1,000 mg total) by mouth 3 (three) times daily for 7 days., Disp: 21 tablet, Rfl: 0   Objective:     BP 136/68 (BP Location: Right Arm, Patient Position: Sitting, Cuff Size: Normal)   Pulse 89   Temp 97.8 F (36.6 C) (Temporal)   Ht '5\' 4"'$  (1.626 m)   Wt 169 lb (76.7 kg)   SpO2 98%   BMI 29.01 kg/m    Physical Exam Constitutional:      General: She is not in acute distress.    Appearance: Normal appearance. She is not ill-appearing, toxic-appearing or diaphoretic.  HENT:      Head: Normocephalic and atraumatic.     Right Ear: External ear normal.     Left Ear: External ear normal.  Eyes:     General: No scleral icterus.       Right eye: No discharge.        Left eye: No discharge.     Extraocular Movements: Extraocular movements intact.     Conjunctiva/sclera: Conjunctivae normal.  Pulmonary:     Effort: Pulmonary effort is normal. No respiratory distress.  Skin:    General: Skin is warm and dry.  Neurological:     Mental Status: She is alert and oriented to person, place, and time.  Psychiatric:        Mood and Affect: Mood normal.        Behavior: Behavior normal.      No results found for any visits on 10/04/22.    The ASCVD Risk score (Arnett DK, et al., 2019) failed to calculate for the following reasons:   The 2019 ASCVD risk score is only valid for ages 49 to 28    Assessment & Plan:   Insomnia, unspecified type    Return in about 1 month (around 11/02/2022).  Will go ahead and try the lower dose of mirtazapine.  Information was given on insomnia.  Libby Maw, MD

## 2022-10-07 DIAGNOSIS — L738 Other specified follicular disorders: Secondary | ICD-10-CM | POA: Diagnosis not present

## 2022-10-07 DIAGNOSIS — D2262 Melanocytic nevi of left upper limb, including shoulder: Secondary | ICD-10-CM | POA: Diagnosis not present

## 2022-10-07 DIAGNOSIS — L821 Other seborrheic keratosis: Secondary | ICD-10-CM | POA: Diagnosis not present

## 2022-10-07 DIAGNOSIS — L570 Actinic keratosis: Secondary | ICD-10-CM | POA: Diagnosis not present

## 2022-10-07 DIAGNOSIS — L218 Other seborrheic dermatitis: Secondary | ICD-10-CM | POA: Diagnosis not present

## 2022-10-07 DIAGNOSIS — D2272 Melanocytic nevi of left lower limb, including hip: Secondary | ICD-10-CM | POA: Diagnosis not present

## 2022-10-11 DIAGNOSIS — F4323 Adjustment disorder with mixed anxiety and depressed mood: Secondary | ICD-10-CM | POA: Diagnosis not present

## 2022-10-22 ENCOUNTER — Telehealth: Payer: Self-pay | Admitting: Cardiology

## 2022-10-22 DIAGNOSIS — E785 Hyperlipidemia, unspecified: Secondary | ICD-10-CM

## 2022-10-22 MED ORDER — ROSUVASTATIN CALCIUM 5 MG PO TABS: 5.0000 mg | ORAL_TABLET | Freq: Every day | ORAL | 3 refills | Status: DC

## 2022-10-22 NOTE — Telephone Encounter (Signed)
Pt c/o medication issue:  1. Name of Medication: Atorvastatin  2. How are you currently taking this medication (dosage and times per day)?   3. Are you having a reaction (difficulty breathing--STAT)?   4. What is your medication issue? Etreme and unusual tiredness, joint pains and unable to sleep

## 2022-10-22 NOTE — Telephone Encounter (Signed)
Called and reviewed Dr. Oralia Rud advice to stop atorvastatin for a week while she celebrates her son's wedding and see if she notices that she feels better, then start 5 mg rosuvastatin to see if that is better tolerated.  Patient agrees to plan, med list updated. Patient verbalizes understanding to take a statin holiday until after her son's wedding, then start 5 mg rosuvastatin. Advised patient to call us back and let us know how she is feeling to we can schedule follow up labs. Patient verbalizes understanding.

## 2022-10-22 NOTE — Telephone Encounter (Signed)
Called patient to discuss her concerns about statin therapy. She states that she started atorvastatin 4-6 months ago (Epic records show lipitor was started 05/18/23) and since then she has had increasing fatigue and sleepiness. (She states she has had problems sleeping for a long time but she did sleep apnea work up in October/November 2023 and sleep study came back negative.) She also states she has new joint pain since starting lipitor that seems to be worse in the morning and seems to improve with tylenol.   Her main concern is that her son is getting married in one week and she would like to feel better so she can "be herself" around family and friends. She is specifically asking if she can lower her dose of lipitor or try a different medication.

## 2022-10-22 NOTE — Addendum Note (Signed)
Addended by: Joni Reining on: 10/22/2022 02:24 PM   Modules accepted: Orders

## 2022-11-04 ENCOUNTER — Ambulatory Visit: Payer: Medicare PPO | Admitting: Family Medicine

## 2022-11-12 ENCOUNTER — Ambulatory Visit (INDEPENDENT_AMBULATORY_CARE_PROVIDER_SITE_OTHER): Payer: Medicare PPO | Admitting: Nurse Practitioner

## 2022-11-12 VITALS — BP 136/70 | HR 79 | Temp 98.8°F | Ht 64.0 in | Wt 165.6 lb

## 2022-11-12 DIAGNOSIS — E1140 Type 2 diabetes mellitus with diabetic neuropathy, unspecified: Secondary | ICD-10-CM

## 2022-11-12 NOTE — Assessment & Plan Note (Signed)
Chronic, stable. She is doing well since switching to rybelsus 7mg  daily and not having any side effects. From my standpoint, she is ok to do group exercise classes through the silver sneakers program at the [facility]. She needs to schedule an appointment for a physical.

## 2022-11-12 NOTE — Progress Notes (Signed)
   Acute Office Visit  Subjective:     Patient ID: Christy [name], female    DOB: 10-28-1940, 82 y.o.   MRN: [id number]  Chief Complaint  Patient presents with   Approval to Exercise    For Silver Sneakers Program    HPI Patient is in today for approval to start exercise.   She has been interested in increasing exercise and went to her local [employer] to look at their group classes. She is interested in starting a yoga class and line dancing class. She was told that she needs to have approval from a healthcare provider before she can start these classes. She denies chest pain and shortness of breath.   ROS See pertinent positives and negatives per HPI.     Objective:    BP 136/70 (BP Location: Left Arm)   Pulse 79   Temp 98.8 F (37.1 C)   Ht 5\' 4"  (1.626 m)   Wt 165 lb 9.6 oz (75.1 kg)   SpO2 96%   BMI 28.43 kg/m    Physical Exam Vitals and nursing note reviewed.  Constitutional:      General: She is not in acute distress.    Appearance: Normal appearance.  HENT:     Head: Normocephalic.  Eyes:     Conjunctiva/sclera: Conjunctivae normal.  Cardiovascular:     Rate and Rhythm: Normal rate and regular rhythm.     Pulses: Normal pulses.     Heart sounds: Normal heart sounds.  Pulmonary:     Effort: Pulmonary effort is normal.     Breath sounds: Normal breath sounds.  Musculoskeletal:     Cervical back: Normal range of motion.  Skin:    General: Skin is warm.  Neurological:     General: No focal deficit present.     Mental Status: She is alert and oriented to person, place, and time.  Psychiatric:        Mood and Affect: Mood normal.        Behavior: Behavior normal.        Thought Content: Thought content normal.        Judgment: Judgment normal.      Assessment & Plan:   Problem List Items Addressed This Visit       Endocrine   Type 2 diabetes mellitus with diabetic neuropathy, without long-term current use of insulin - Primary    Chronic, stable. She  is doing well since switching to rybelsus 7mg  daily and not having any side effects. From my standpoint, she is ok to do group exercise classes through the silver sneakers program at the [facility]. She needs to schedule an appointment for a physical.        No orders of the defined types were placed in this encounter.   Return in about 4 weeks (around 12/10/2022) for CPE.  Gerre Scull, NP

## 2022-11-12 NOTE — Patient Instructions (Signed)
It was great to see you!  I think doing the Silver Sneakers Program is great!   Let's follow-up in 4 weeks, sooner if you have concerns.  If a referral was placed today, you will be contacted for an appointment. Please note that routine referrals can sometimes take up to 3-4 weeks to process. Please call our office if you haven't heard anything after this time frame.  Take care,  Rodman Pickle, NP

## 2022-11-15 ENCOUNTER — Telehealth: Payer: Self-pay | Admitting: Family Medicine

## 2022-11-15 NOTE — Telephone Encounter (Signed)
I called pt and let her know she has been OK'ed to have her physical with Lauren. I left vm and asked to cb to schedule.

## 2022-11-29 ENCOUNTER — Telehealth: Payer: Self-pay | Admitting: Cardiology

## 2022-11-29 DIAGNOSIS — E785 Hyperlipidemia, unspecified: Secondary | ICD-10-CM

## 2022-11-29 NOTE — Telephone Encounter (Signed)
Pt c/o medication issue:  1. Name of Medication: rosuvastatin (CRESTOR) 5 MG tablet   2. How are you currently taking this medication (dosage and times per day)? Take 1 tablet (5 mg total) by mouth daily.   3. Are you having a reaction (difficulty breathing--STAT)? Yes   4. What is your medication issue? Medication is affecting patient's joints and muscles. Patient says that she can barely hold a pen due to pain. Please call back to discuss other medication alternatives

## 2022-11-29 NOTE — Telephone Encounter (Signed)
Patient calling in to report difficulty tolerating crestor 5 mg daily which she started 10/22/22. She states she usually takes it in the afternoon. She was noticing joint aches but Saturday morning she felt like it hurt to get out of bed. She states her hands in particular hurt. She would like to try a non-statin medication to lower her cholesterol. Forwarded to PharmD and Dr. Mayford Knife.

## 2022-11-30 NOTE — Addendum Note (Signed)
Addended by: Tylene Fantasia on: 11/30/2022 12:10 PM   Modules accepted: Orders

## 2022-11-30 NOTE — Telephone Encounter (Addendum)
Called patient N/A. LVM to call back.  A/P: Risk factors: T2DM, CAD, HDL  Goal <70 mg/dl  last LDLc 66 mg/dl (09/81/1914) what lipid lowering medication patient was on?  Atorvastatin 20 mg - myalgia   We can repeat FLP - insetting of multiple statins intolerance if LDLc above goal can consider ezetimibe and/ or [id number].   Patient called back reports she has RA and when she takes statins her joint pain worsens. Previously she was on Lipitor 20 mg which had lower her LDLc but she was unable to tolerate so it was switched to Crestor 5 mg. She has been experiencing muscle aches even on low dose Crestor. Went off of Crestor from last few days and her pain improved significantly.   Will repeat FLP. Will schedule patient for lipid clinic to discuss non statin options.

## 2022-12-02 ENCOUNTER — Other Ambulatory Visit: Payer: Self-pay | Admitting: Pharmacist

## 2022-12-02 ENCOUNTER — Ambulatory Visit: Payer: Medicare PPO | Attending: Cardiology

## 2022-12-02 DIAGNOSIS — E785 Hyperlipidemia, unspecified: Secondary | ICD-10-CM

## 2022-12-03 ENCOUNTER — Ambulatory Visit (INDEPENDENT_AMBULATORY_CARE_PROVIDER_SITE_OTHER): Payer: Medicare PPO | Admitting: Sports Medicine

## 2022-12-03 ENCOUNTER — Encounter: Payer: Self-pay | Admitting: Sports Medicine

## 2022-12-03 ENCOUNTER — Other Ambulatory Visit (INDEPENDENT_AMBULATORY_CARE_PROVIDER_SITE_OTHER): Payer: Medicare PPO

## 2022-12-03 DIAGNOSIS — Z789 Other specified health status: Secondary | ICD-10-CM | POA: Diagnosis not present

## 2022-12-03 DIAGNOSIS — M19011 Primary osteoarthritis, right shoulder: Secondary | ICD-10-CM

## 2022-12-03 DIAGNOSIS — M25511 Pain in right shoulder: Secondary | ICD-10-CM

## 2022-12-03 DIAGNOSIS — E1140 Type 2 diabetes mellitus with diabetic neuropathy, unspecified: Secondary | ICD-10-CM

## 2022-12-03 LAB — LIPID PANEL
Chol/HDL Ratio: 1.9 ratio (ref 0.0–4.4)
Cholesterol, Total: 173 mg/dL (ref 100–199)
HDL: 90 mg/dL (ref >39)
LDL Chol Calc (NIH): 71 mg/dL (ref 0–99)
Triglycerides: 60 mg/dL (ref 0–149)
VLDL Cholesterol Cal: 12 mg/dL (ref 5–40)

## 2022-12-03 MED ORDER — LIDOCAINE HCL 1 % IJ SOLN
2.0000 mL | INTRAMUSCULAR | Status: AC | PRN
Start: 2022-12-03 — End: 2022-12-03
  Administered 2022-12-03: 2 mL

## 2022-12-03 MED ORDER — BUPIVACAINE HCL 0.25 % IJ SOLN
2.0000 mL | INTRAMUSCULAR | Status: AC | PRN
Start: 2022-12-03 — End: 2022-12-03
  Administered 2022-12-03: 2 mL via INTRA_ARTICULAR

## 2022-12-03 MED ORDER — PREDNISONE 20 MG PO TABS: 20.0000 mg | ORAL_TABLET | Freq: Every day | ORAL | 0 refills | Status: AC

## 2022-12-03 MED ORDER — METHYLPREDNISOLONE ACETATE 40 MG/ML IJ SUSP
40.0000 mg | INTRAMUSCULAR | Status: AC | PRN
Start: 2022-12-03 — End: 2022-12-03
  Administered 2022-12-03: 40 mg via INTRA_ARTICULAR

## 2022-12-03 NOTE — Progress Notes (Signed)
Unable to move shoulder/arm since yesterday "States it is frozen" Was given new medication; statin (saw this as a side effect) Does have some pain down into the arm

## 2022-12-03 NOTE — Telephone Encounter (Signed)
Lipid lab 71 mg/dl (29/56/2130) Called n/a, LVM

## 2022-12-03 NOTE — Progress Notes (Signed)
Christy [name] - 82 y.o. female MRN [id number]  Date of birth: 01/10/1941  Office Visit Note: Visit Date: 12/03/2022 PCP: Mliss Sax, MD Referred by: Mliss Sax,*  Subjective: Chief Complaint  Patient presents with   Right Shoulder - Pain   HPI: Christy [name] is a pleasant 82 y.o. female who presents today for acute right shoulder pain and stiffness.  She has been changing cholesterol medications, previously on Crestor.  About 2 weeks ago they did switch this to Lipitor.  But a week after this she started noticing some pain and stiffness in the shoulder and upper arm area.  She stopped this medication altogether on Sunday.  Since the weekend she has had very severe right shoulder pain and limited range of motion.  She denies any redness or swelling.  She has been taking Tylenol.  Lab Results  Component Value Date   HGBA1C 7.4 (H) 05/10/2022   Previous A1c was 6.7 10 months ago. Managed on Metformin 500mg  BID.  Pertinent ROS were reviewed with the patient and found to be negative unless otherwise specified above in HPI.   Assessment & Plan: Visit Diagnoses:  1. Acute pain of right shoulder   2. Type 2 diabetes mellitus with diabetic neuropathy, without long-term current use of insulin (HCC)   3. Primary osteoarthritis, right shoulder   4. Statin intolerance    Plan: Discussed with Christy [name] the possible etiologies of her right shoulder and arm pain.  She does have arthritis within the glenohumeral joint, although I am less suspicious of this being the primary etiology of her pain given the acuity.  I do have suspicion for statin induced joint and myalgia given that just 1-2 weeks prior she was changed from Crestor to Lipitor shortly before her symptoms started.  Adhesive capsulitis is lower on the differential given that I can move her shoulder passively with rather good range of motion.  We discussed all treatment options and through shared decision making elected  to proceed with a subacromial joint injection.  I also would like to start her on oral prednisone a low-dose of 20 mg to be taken once daily for the next week.  She does have diabetes but is rather well-controlled with her A1c is between 6 and 7's, she will continue on her metformin 500 mg twice daily.  She will have a discussion with her PCP about trialing off of the statin or trying a different cholesterol medication in the meantime.  She will follow-up with me in 3 weeks for reevaluation.  Did give her strict return precautions to seek further care if redness, fever chills, worsening of pain.  Follow-up: Return in about 3 weeks (around 12/24/2022) for for right shoulder pain.   Meds & Orders:  Meds ordered this encounter  Medications   predniSONE (DELTASONE) 20 MG tablet    Sig: Take 1 tablet (20 mg total) by mouth daily with breakfast for 7 days.    Dispense:  7 tablet    Refill:  0    Orders Placed This Encounter  Procedures   Large Joint Inj   XR Shoulder Right     Procedures: Large Joint Inj: R subacromial bursa on 12/03/2022 11:20 AM Indications: pain and diagnostic evaluation Details: 22 G 1.5 in needle, posterior approach Medications: 2 mL lidocaine 1 %; 2 mL bupivacaine 0.25 %; 40 mg methylPREDNISolone acetate 40 MG/ML Outcome: tolerated well, no immediate complications  Subacromial Joint Injection, Right Shoulder After discussion on risks/benefits/indications, informed verbal consent  was obtained. A timeout was then performed. Patient was seated on table in exam room. The patient's shoulder was prepped with betadine and alcohol swabs and utilizing posterior approach a 22G, 1.5" needle was directed anteriorly and laterally into the patient's subacromial space was injected with 2:2:1 mixture of lidocaine:bupivicaine:depomedrol with appreciation of free-flowing of the injectate into the bursal space. Patient tolerated the procedure well without immediate complications.   Procedure,  treatment alternatives, risks and benefits explained, specific risks discussed. Consent was given by the patient. Immediately prior to procedure a time out was called to verify the correct patient, procedure, equipment, support staff and site/side marked as required. Patient was prepped and draped in the usual sterile fashion.          Clinical History: No specialty comments available.  She reports that she has never smoked. She has never used smokeless tobacco.  Recent Labs    01/26/22 0000 05/10/22 1630  HGBA1C 6.7 7.4*    Objective:   Vital Signs: There were no vitals taken for this visit.  Physical Exam  Gen: Well-appearing, in no acute distress; non-toxic CV:  Well-perfused. Warm.  Resp: Breathing unlabored on room air; no wheezing. Psych: Fluid speech in conversation; appropriate affect; normal thought process Neuro: Sensation intact throughout. No gross coordination deficits.   Ortho Exam - Right shoulder: There is generalized tenderness to palpation throughout the anterior and lateral shoulder.  There is some mild swelling and warmth without redness noted to the shoulder.  There is pain in all range of motion.  She has grossly restricted active range of motion, likely secondary to pain and guarding, I am able to take care to approximate 150 degrees of flexion and 120 degrees of abduction.  External rotation to about 45 degrees compared to 55 degrees of the left shoulder.  Grossly positive drop arm test with significant pain.  Imaging: XR Shoulder Right  Result Date: 12/03/2022 Roda Shutters including AP, Y and axial views were ordered and reviewed by myself.  X-rays show mild to moderate glenohumeral joint space narrowing with arthritic change.  There is a slightly higher pain indicative of rotator cuff arthropathy.  Mild AC joint arthropathy.  No acute fracture or bony abnormality noted   Past Medical/Family/Surgical/Social History: Medications & Allergies reviewed per EMR, new  medications updated. Patient Active Problem List   Diagnosis Date Noted   Lip swelling 09/28/2022   Cognitive decline 09/02/2022   Insomnia 09/02/2022   Hearing loss 09/02/2022   Acute pain of left knee 05/10/2022   Rheumatoid arthritis of right foot (HCC) 03/31/2022   Type 2 diabetes mellitus with diabetic neuropathy, without long-term current use of insulin (HCC) 03/31/2022   Anxiety and depression 03/31/2022   Diarrhea of presumed infectious origin 03/31/2022   History of breast cancer 03/31/2022   Pulmonary insufficiency 05/13/2021   PAT (paroxysmal atrial tachycardia) 04/14/2021   Agatston coronary artery calcium score greater than 400 04/14/2021   HLD (hyperlipidemia) 04/14/2021   Past Medical History:  Diagnosis Date   Agatston coronary artery calcium score greater than 400    Ca score 1121 in July 2022   Allergy    Arthritis    Chronic constipation    Depression    Diabetes mellitus without complication (HCC)    History of breast cancer    HLD (hyperlipidemia)    PAT (paroxysmal atrial tachycardia)    Ziopatch 02/2021   Pulmonary insufficiency    moderate by echo 11/23   Rheumatoid arthritis (HCC)  Family History  Problem Relation Age of Onset   Arthritis Mother    Dementia Mother    Stroke Mother    Alcohol abuse Father    Cancer Father        lung   Arthritis Sister    Cancer Sister    Early death Sister    Learning disabilities Son    Depression Son    Diabetes Son    Arthritis Maternal Grandmother    Hearing loss Maternal Grandmother    Heart attack Maternal Grandfather    Past Surgical History:  Procedure Laterality Date   ABDOMINAL HYSTERECTOMY     ACHILLES TENDON REPAIR     BREAST REDUCTION SURGERY Left    CATARACT EXTRACTION     masectomy Right    TONSILLECTOMY AND ADENOIDECTOMY     Social History   Occupational History   Not on file  Tobacco Use   Smoking status: Never   Smokeless tobacco: Never  Vaping Use   Vaping Use: Never  used  Substance and Sexual Activity   Alcohol use: Not Currently   Drug use: Not Currently   Sexual activity: Not Currently

## 2022-12-07 NOTE — Telephone Encounter (Signed)
Appointment to lipid clinic booked on June 20 at 1:30 to discussed non-statins options.

## 2022-12-08 ENCOUNTER — Encounter: Payer: Medicare PPO | Admitting: Nurse Practitioner

## 2022-12-22 ENCOUNTER — Telehealth: Payer: Self-pay

## 2022-12-22 NOTE — Patient Outreach (Signed)
  Care Coordination   12/22/2022 Name: Christy [name] MRN: [id number] DOB: December 06, 1940   Care Coordination Outreach Attempts:  An unsuccessful telephone outreach was attempted today to offer the patient information about available care coordination services.  Follow Up Plan:  Additional outreach attempts will be made to offer the patient care coordination information and services.   Encounter Outcome:  No Answer   Care Coordination Interventions:  No, not indicated    Bary Leriche, RN, MSN [facility] Care Management Care Management Coordinator Direct Line [phone]

## 2022-12-27 ENCOUNTER — Ambulatory Visit: Payer: Medicare PPO | Admitting: Sports Medicine

## 2022-12-27 DIAGNOSIS — M62838 Other muscle spasm: Secondary | ICD-10-CM | POA: Diagnosis not present

## 2022-12-30 DIAGNOSIS — E559 Vitamin D deficiency, unspecified: Secondary | ICD-10-CM | POA: Diagnosis not present

## 2022-12-30 DIAGNOSIS — Z6828 Body mass index (BMI) 28.0-28.9, adult: Secondary | ICD-10-CM | POA: Diagnosis not present

## 2022-12-30 DIAGNOSIS — R5383 Other fatigue: Secondary | ICD-10-CM | POA: Diagnosis not present

## 2022-12-30 DIAGNOSIS — M1991 Primary osteoarthritis, unspecified site: Secondary | ICD-10-CM | POA: Diagnosis not present

## 2022-12-30 DIAGNOSIS — R6889 Other general symptoms and signs: Secondary | ICD-10-CM | POA: Diagnosis not present

## 2022-12-30 DIAGNOSIS — E663 Overweight: Secondary | ICD-10-CM | POA: Diagnosis not present

## 2022-12-30 DIAGNOSIS — M0579 Rheumatoid arthritis with rheumatoid factor of multiple sites without organ or systems involvement: Secondary | ICD-10-CM | POA: Diagnosis not present

## 2023-01-04 DIAGNOSIS — Z0000 Encounter for general adult medical examination without abnormal findings: Secondary | ICD-10-CM | POA: Diagnosis not present

## 2023-01-04 DIAGNOSIS — M069 Rheumatoid arthritis, unspecified: Secondary | ICD-10-CM | POA: Diagnosis not present

## 2023-01-04 DIAGNOSIS — E785 Hyperlipidemia, unspecified: Secondary | ICD-10-CM | POA: Diagnosis not present

## 2023-01-04 DIAGNOSIS — F418 Other specified anxiety disorders: Secondary | ICD-10-CM | POA: Diagnosis not present

## 2023-01-04 DIAGNOSIS — E1140 Type 2 diabetes mellitus with diabetic neuropathy, unspecified: Secondary | ICD-10-CM | POA: Diagnosis not present

## 2023-01-04 DIAGNOSIS — I4719 Other supraventricular tachycardia: Secondary | ICD-10-CM | POA: Diagnosis not present

## 2023-01-04 DIAGNOSIS — Z853 Personal history of malignant neoplasm of breast: Secondary | ICD-10-CM | POA: Diagnosis not present

## 2023-01-04 DIAGNOSIS — R931 Abnormal findings on diagnostic imaging of heart and coronary circulation: Secondary | ICD-10-CM | POA: Diagnosis not present

## 2023-01-04 DIAGNOSIS — Z789 Other specified health status: Secondary | ICD-10-CM | POA: Diagnosis not present

## 2023-01-13 DIAGNOSIS — R413 Other amnesia: Secondary | ICD-10-CM | POA: Diagnosis not present

## 2023-01-13 DIAGNOSIS — K5909 Other constipation: Secondary | ICD-10-CM | POA: Diagnosis not present

## 2023-01-13 DIAGNOSIS — E1140 Type 2 diabetes mellitus with diabetic neuropathy, unspecified: Secondary | ICD-10-CM | POA: Diagnosis not present

## 2023-01-13 DIAGNOSIS — F419 Anxiety disorder, unspecified: Secondary | ICD-10-CM | POA: Diagnosis not present

## 2023-01-24 ENCOUNTER — Ambulatory Visit: Payer: Medicare PPO | Attending: Cardiology

## 2023-01-24 DIAGNOSIS — E785 Hyperlipidemia, unspecified: Secondary | ICD-10-CM | POA: Diagnosis not present

## 2023-01-24 LAB — LIPID PANEL
Chol/HDL Ratio: 2.1 ratio (ref 0.0–4.4)
Cholesterol, Total: 186 mg/dL (ref 100–199)
HDL: 87 mg/dL (ref >39)
LDL Chol Calc (NIH): 88 mg/dL (ref 0–99)
Triglycerides: 57 mg/dL (ref 0–149)
VLDL Cholesterol Cal: 11 mg/dL (ref 5–40)

## 2023-01-26 ENCOUNTER — Telehealth: Payer: Self-pay

## 2023-01-26 NOTE — Telephone Encounter (Signed)
-----   Message from Loa Socks, LPN sent at 1/61/0960 10:19 AM EDT -----  ----- Message ----- From: Quintella Reichert, MD Sent: 01/25/2023   9:12 AM EDT To: Mliss Sax, MD; Cv Div Ch St Triage  LDL still not at goal - please verify the statin and does she is currently on

## 2023-01-26 NOTE — Telephone Encounter (Signed)
Reviewed LDL results w/ patient, who states she was very surprised that her LDL went up as she has recently lost 12 lbs through diet and exercise. Patient also states she was taken off  both lovaza and rosuvastatin sometime ago due to side effects of dizziness and fatigue. Patient states she has an appt w/ PharmD Ashok Croon through lipid clinic this Friday 01/28/23 to discuss.

## 2023-01-28 ENCOUNTER — Ambulatory Visit: Payer: Medicare PPO | Attending: Cardiology | Admitting: Pharmacist

## 2023-01-28 ENCOUNTER — Encounter: Payer: Self-pay | Admitting: Pharmacist

## 2023-01-28 DIAGNOSIS — E785 Hyperlipidemia, unspecified: Secondary | ICD-10-CM | POA: Diagnosis not present

## 2023-01-28 MED ORDER — REPATHA SURECLICK 140 MG/ML ~~LOC~~ SOAJ: 140.0000 mg | SUBCUTANEOUS | 3 refills | Status: DC

## 2023-01-28 NOTE — Addendum Note (Signed)
Addended by: Lile Mccurley E on: 01/28/2023 02:14 PM   Modules accepted: Orders

## 2023-01-28 NOTE — Patient Instructions (Signed)
Your LDL cholesterol is 88 and your goal is < 70  I will submit information to your insurance for Repatha and let you know when I hear back.    Repatha is a subcutaneous injection given once every 2 weeks in the fatty tissue of your stomach or upper outer thigh. Store the medication in the fridge. You can let your dose warm up to room temperature for 30 minutes before injecting if you prefer. Repatha will lower your LDL cholesterol by 60% and helps to lower your chance of having a heart attack or stroke.  You can ask your primary care doctor about trying Mounjaro (once weekly injection) instead of Rybelsus as it helps more with weight loss

## 2023-01-28 NOTE — Progress Notes (Addendum)
Patient ID: Christy [name]                 DOB: Dec 31, 1940                    MRN: [id number]     HPI: Christy [name] is a 82 y.o. female patient referred to lipid clinic by Dr Mayford Knife. PMH is significant for T2DM, coronary calcium score of 1121 (92nd percentile for age and sex matched control) with aortic atherosclerosis in 02/2021, and brief runs of nonsustained atrial tachycardia, PACs, and PVCs. LDL previously well controlled at 69 on atorvastatin 20mg  daily (started 05/2022).   Pt called in 10/22/22 reporting extreme fatigue, joint pain, and insomnia that she attributed to her atorvastatin. She was advised to stop atorvastatin, monitor for symptom improvement, then start on rosuvastatin 5mg  daily. Pt called clinic on 11/29/22 reporting joint and muscle pain with rosuvastatin, was having trouble holding a pen.   Today, pt reports symptoms improved off of statin therapy within a week. Doesn't recall side effect with fish oil and has been taking this OTC, advised she can stop as her TG are normal. Lost 14 lbs through diet and exercise. Has state health plan Medicare + Tricare.  Current Medications: none Intolerances: rosuvastatin 5mg  daily, atorvastatin 20mg  daily - joint and muscle pain; Lovaza - dizziness and fatigue Risk Factors: DM, elevated calcium score > 1,000 LDL goal: 70mg /dL  Family History: Alcohol abuse in her father; Arthritis in her maternal grandmother, mother, and sister; Cancer in her father and sister; Dementia in her mother; Depression in her son; Diabetes in her son; Early death in her sister; Hearing loss in her maternal grandmother; Heart attack in her maternal grandfather; Learning disabilities in her son; Stroke in her mother.   Social History: No tobacco, alcohol or drug use  Labs: 01/24/23: TC 186, TG 57, HDL 87, LDL 88 - no LLT  Past Medical History:  Diagnosis Date   Agatston coronary artery calcium score greater than 400    Ca score 1121 in July 2022   Allergy     Arthritis    Chronic constipation    Depression    Diabetes mellitus without complication (HCC)    History of breast cancer    HLD (hyperlipidemia)    PAT (paroxysmal atrial tachycardia)    Ziopatch 02/2021   Pulmonary insufficiency    moderate by echo 11/23   Rheumatoid arthritis (HCC)     Current Outpatient Medications on File Prior to Visit  Medication Sig Dispense Refill   Calcium Citrate 150 MG CAPS Take 1,000 mg by mouth daily.       Cholecalciferol (VITAMIN D) 1000 UNITS capsule Take 1,000 Units by mouth daily.       folic acid (FOLVITE) 1 MG tablet Take 1 mg by mouth daily.       glucose blood (TRUE METRIX BLOOD GLUCOSE TEST) test strip 1 each by Other route as directed. Use as instructed 100 each 3   metFORMIN (GLUCOPHAGE) 500 MG tablet Take 500 mg by mouth 2 (two) times daily with a meal.       methotrexate (RHEUMATREX) 2.5 MG tablet Take 2.5 mg by mouth. Caution:Chemotherapy. Protect from light.      MULTIPLE VITAMIN PO Take 1 tablet by mouth daily.       omega-3 acid ethyl esters (LOVAZA) 1 g capsule Take by mouth 2 (two) times daily.     PSYLLIUM PO Take 8-10 capsules by mouth daily.  rosuvastatin (CRESTOR) 5 MG tablet Take 1 tablet (5 mg total) by mouth daily. 90 tablet 3   Semaglutide (RYBELSUS) 7 MG TABS Take 1 tablet (7 mg total) by mouth daily. 90 tablet 0   No current facility-administered medications on file prior to visit.    Allergies  Allergen Reactions   Epinephrine    Erythromycin    Gluten Swelling    Feet will swell.   Penicillins    Tetracyclines & Related     Assessment/Plan:  1. Hyperlipidemia - LDL 88 off of statin therapy for 2 months, above goal < 70 given elevated CAC > 1,000. Intolerant to atorvastatin 20mg  daily and rosuvastatin 5mg  daily. Reviewed options for lowering LDL cholesterol, including ezetimibe, PCSK9 inhibitors, and Nexlizet. Pt prefers to try Repatha. Prior auth submitted and approved through 08/09/23.  Deniz Hannan E. Dryden Tapley,  PharmD, BCACP, CPP [facility] [address], [place], [place] [address] Phone: [phone]; Fax: [phone] 01/28/2023 1:46 PM

## 2023-01-31 NOTE — Telephone Encounter (Signed)
I have also left message for pt notifying her of med approval and to call to schedule f/u labs.

## 2023-02-01 ENCOUNTER — Other Ambulatory Visit: Payer: Self-pay

## 2023-02-01 MED ORDER — RYBELSUS 7 MG PO TABS: 7.0000 mg | ORAL_TABLET | Freq: Every day | ORAL | 0 refills | Status: DC

## 2023-03-01 DIAGNOSIS — R42 Dizziness and giddiness: Secondary | ICD-10-CM | POA: Diagnosis not present

## 2023-03-08 DIAGNOSIS — R931 Abnormal findings on diagnostic imaging of heart and coronary circulation: Secondary | ICD-10-CM | POA: Diagnosis not present

## 2023-03-08 DIAGNOSIS — R030 Elevated blood-pressure reading, without diagnosis of hypertension: Secondary | ICD-10-CM | POA: Diagnosis not present

## 2023-03-08 DIAGNOSIS — E1140 Type 2 diabetes mellitus with diabetic neuropathy, unspecified: Secondary | ICD-10-CM | POA: Diagnosis not present

## 2023-03-29 ENCOUNTER — Ambulatory Visit: Payer: Medicare PPO | Admitting: Cardiology

## 2023-03-31 NOTE — Progress Notes (Signed)
Cardiology Office Note:  .   Date:  04/01/2023  ID:  Knya Endris, DOB 1941/01/17, MRN [id number] PCP: Mliss Sax, MD  [facility] Providers Cardiologist:  Armanda Magic, MD {  History of Present Illness: .   Christy [name] is a 82 y.o. female with a past medical history of diabetes mellitus and presyncope, nonsustained atrial tachycardia, PVCs/PACs seen on Zio patch, coronary calcium score done July 2022 showing elevated level at 1121 which is 92nd percentile for age and sex matched controls, aortic atherosclerosis here for follow-up appointment.  2D echocardiogram showed normal LVEF with moderate pulmonary regurgitation and grade 2 diastolic dysfunction.  Carotid Doppler showed no evidence of carotid artery disease.  Lexiscan Myoview showed no ischemia.  Was last seen 04/14/2022 and at that time felt like she did not have any energy and felt  tired all the time.  Going on for 6 months.  Denies chest pain/pressure, SOB, DOE, PND, orthopnea, lower extremity edema, palpitations, syncope or presyncope.  Occasionally has dizzy spells but nothing recently.  Compliant with medications and tolerant without side effects.  Today, she tells me that unfortunately she is still dealing presyncopal symptoms.  She states last night her husband was helping her dry her hair and she fell lightheaded and dizzy for a few seconds.  She did not pass out.  It was a quick onset and quick offset feeling almost like she was being choked and then it resolves.  Full cardiac workup done in the past including stress test, carotid ultrasound, echocardiogram, and long-term monitor which were all negative for reasons behind her presyncope.  She has been having this issue for 2 years.  She is off Repatha and is interested in starting a pill since she will be on potentially 2 other injectable occasions.  Her rheumatologist wants to take her off of methotrexate and starting injectable.  We discussed Nexlizet and she  is interested starting.  She states that she has never been a good sleeper and she usually gets up every 2 hours because she cannot stay asleep and uses the bathroom.  She sees neurology next month and will address these issues.  Cautioned driving.  Reports no shortness of breath nor dyspnea on exertion. Reports no chest pain, pressure, or tightness. No edema, orthopnea, PND. Reports no palpitations.   ROS: Pertinent ROS in HPI  Studies Reviewed: .       Echocardiogram 06/11/2022 IMPRESSIONS     1. Left ventricular ejection fraction, by estimation, is 60 to 65%. The  left ventricle has normal function. The left ventricle has no regional  wall motion abnormalities. There is mild concentric left ventricular  hypertrophy. Left ventricular diastolic  parameters are consistent with Grade I diastolic dysfunction (impaired  relaxation).   2. Right ventricular systolic function is normal. The right ventricular  size is normal. There is normal pulmonary artery systolic pressure.   3. Left atrial size was moderately dilated.   4. No evidence of mitral valve regurgitation.   5. The aortic valve is tricuspid. Aortic valve regurgitation is not  visualized.   6. Pulmonic valve regurgitation is moderate.   7. The inferior vena cava is normal in size with greater than 50%  respiratory variability, suggesting right atrial pressure of 3 mmHg.   Comparison(s): No significant change from prior study.   FINDINGS   Left Ventricle: Left ventricular ejection fraction, by estimation, is 60  to 65%. The left ventricle has normal function. The left ventricle has no  regional wall motion abnormalities. The left ventricular internal cavity  size was normal in size. There is   mild concentric left ventricular hypertrophy. Left ventricular diastolic  parameters are consistent with Grade I diastolic dysfunction (impaired  relaxation).   Right Ventricle: The right ventricular size is normal. Right ventricular   systolic function is normal. There is normal pulmonary artery systolic  pressure. The tricuspid regurgitant velocity is 2.65 m/s, and with an  assumed right atrial pressure of 3 mmHg,   the estimated right ventricular systolic pressure is 31.1 mmHg.   Left Atrium: Left atrial size was moderately dilated.   Right Atrium: Right atrial size was normal in size.   Pericardium: There is no evidence of pericardial effusion.   Mitral Valve: No evidence of mitral valve regurgitation.   Tricuspid Valve: The tricuspid valve is normal in structure. Tricuspid  valve regurgitation is mild.   Aortic Valve: The aortic valve is tricuspid. Aortic valve regurgitation is  not visualized.   Pulmonic Valve: Pulmonic valve regurgitation is moderate.   Aorta: The aortic root and ascending aorta are structurally normal, with  no evidence of dilitation.   Venous: The inferior vena cava is normal in size with greater than 50%  respiratory variability, suggesting right atrial pressure of 3 mmHg.   IAS/Shunts: No atrial level shunt detected by color flow Doppler.   Physical Exam:   VS:  BP (!) 130/56   Pulse 85   Ht 5\' 4"  (1.626 m)   Wt 156 lb 3.2 oz (70.9 kg)   SpO2 98%   BMI 26.81 kg/m    Wt Readings from Last 3 Encounters:  04/01/23 156 lb 3.2 oz (70.9 kg)  11/12/22 165 lb 9.6 oz (75.1 kg)  10/04/22 169 lb (76.7 kg)    GEN: Well nourished, well developed in no acute distress NECK: No JVD; No carotid bruits CARDIAC: RRR, no murmurs, rubs, gallops RESPIRATORY:  Clear to auscultation without rales, wheezing or rhonchi  ABDOMEN: Soft, non-tender, non-distended EXTREMITIES:  No edema; No deformity   ASSESSMENT AND PLAN: .   1.  Presyncope -had a session last night -increase water intake -she does not loose consciousness, like someone is grabbing her her neck and she gets dizzy. -She has neurology consultation for further workup, full cardiac workup was negative  2.  Pure  hypercholesterolemia -She is no longer on Repatha because she feels that 3 injectable medications is too much -Will plan to start Nexlizet 180/10mg  daily -recheck lipid panel and LFTs in 3 months  3.  Paroxysmal atrial tachycardia -no palpitation  -continue current medications  4.  Primary hypertension -Well-controlled today, 136 -Continue current medication regimen  5.  Pulmonary insufficiency -will update echo  -No new symptoms  6.  Excessive daytime sleepiness -no sleep apnea       Dispo: She can follow-up with Dr. Mayford Knife in a year  Signed, Sharlene Dory, PA-C

## 2023-04-01 ENCOUNTER — Encounter: Payer: Self-pay | Admitting: Physician Assistant

## 2023-04-01 ENCOUNTER — Ambulatory Visit: Payer: Medicare PPO | Attending: Cardiology | Admitting: Physician Assistant

## 2023-04-01 VITALS — BP 130/56 | HR 85 | Ht 64.0 in | Wt 156.2 lb

## 2023-04-01 DIAGNOSIS — E785 Hyperlipidemia, unspecified: Secondary | ICD-10-CM

## 2023-04-01 DIAGNOSIS — G4719 Other hypersomnia: Secondary | ICD-10-CM | POA: Diagnosis not present

## 2023-04-01 DIAGNOSIS — E7800 Pure hypercholesterolemia, unspecified: Secondary | ICD-10-CM

## 2023-04-01 DIAGNOSIS — I10 Essential (primary) hypertension: Secondary | ICD-10-CM

## 2023-04-01 DIAGNOSIS — R55 Syncope and collapse: Secondary | ICD-10-CM

## 2023-04-01 DIAGNOSIS — I4719 Other supraventricular tachycardia: Secondary | ICD-10-CM

## 2023-04-01 MED ORDER — NEXLIZET 180-10 MG PO TABS: 1.0000 | ORAL_TABLET | Freq: Every day | ORAL | 6 refills | Status: AC

## 2023-04-01 NOTE — Patient Instructions (Addendum)
Medication Instructions:  START Nexlizet 180/10mg  Take 1 tablet once a day  *If you need a refill on your cardiac medications before your next appointment, please call your pharmacy*   Lab Work: 3 MONTHS LFT's & LIPIDS If you have labs (blood work) drawn today and your tests are completely normal, you will receive your results only by: MyChart Message (if you have MyChart) OR A paper copy in the mail If you have any lab test that is abnormal or we need to change your treatment, we will call you to review the results.   Testing/Procedures: Your physician has requested that you have an echocardiogram. Echocardiography is a painless test that uses sound waves to create images of your heart. It provides your doctor with information about the size and shape of your heart and how well your heart's chambers and valves are working. This procedure takes approximately one hour. There are no restrictions for this procedure. Please do NOT wear cologne, perfume, aftershave, or lotions (deodorant is allowed). Please arrive 15 minutes prior to your appointment time. SCHEDULE IN SEPTEMBER 2024   Follow-Up: At [facility], you and your health needs are our priority.  As part of our continuing mission to provide you with exceptional heart care, we have created designated Provider Care Teams.  These Care Teams include your primary Cardiologist (physician) and Advanced Practice Providers (APPs -  Physician Assistants and Nurse Practitioners) who all work together to provide you with the care you need, when you need it.  We recommend signing up for the patient portal called "MyChart".  Sign up information is provided on this After Visit Summary.  MyChart is used to connect with patients for Virtual Visits (Telemedicine).  Patients are able to view lab/test results, encounter notes, upcoming appointments, etc.  Non-urgent messages can be sent to your provider as well.   To learn more about what you can  do with MyChart, go to [email or web address].    Your next appointment:   12 month(s)  Provider:   Armanda Magic, MD     Other Instructions

## 2023-04-05 ENCOUNTER — Telehealth: Payer: Self-pay

## 2023-04-05 ENCOUNTER — Other Ambulatory Visit (HOSPITAL_COMMUNITY): Payer: Self-pay

## 2023-04-05 NOTE — Telephone Encounter (Signed)
Pharmacy Patient Advocate Encounter   Received notification from CoverMyMeds that prior authorization for NEXLIZET is required/requested.   Insurance verification completed.   The patient is insured through [employer] .   Per test claim: PA required; PA submitted to [facility] via CoverMyMeds Key/confirmation #/[id number] [facility] Status is pending

## 2023-04-05 NOTE — Telephone Encounter (Signed)
Pharmacy Patient Advocate Encounter  Received notification from [facility] that Prior Authorization for NEXLIZET has been APPROVED from 08/09/22 to 08/09/23. Ran test claim, Copay is $40. This test claim was processed through [facility]- copay amounts may vary at other pharmacies due to pharmacy/plan contracts, or as the patient moves through the different stages of their insurance plan.

## 2023-04-07 DIAGNOSIS — E1140 Type 2 diabetes mellitus with diabetic neuropathy, unspecified: Secondary | ICD-10-CM | POA: Diagnosis not present

## 2023-04-07 DIAGNOSIS — R931 Abnormal findings on diagnostic imaging of heart and coronary circulation: Secondary | ICD-10-CM | POA: Diagnosis not present

## 2023-04-07 DIAGNOSIS — E785 Hyperlipidemia, unspecified: Secondary | ICD-10-CM | POA: Diagnosis not present

## 2023-04-07 DIAGNOSIS — F418 Other specified anxiety disorders: Secondary | ICD-10-CM | POA: Diagnosis not present

## 2023-04-08 ENCOUNTER — Ambulatory Visit: Payer: Medicare PPO

## 2023-04-19 ENCOUNTER — Other Ambulatory Visit (HOSPITAL_COMMUNITY): Payer: Medicare PPO

## 2023-04-20 DIAGNOSIS — M1991 Primary osteoarthritis, unspecified site: Secondary | ICD-10-CM | POA: Diagnosis not present

## 2023-04-20 DIAGNOSIS — E663 Overweight: Secondary | ICD-10-CM | POA: Diagnosis not present

## 2023-04-20 DIAGNOSIS — M25511 Pain in right shoulder: Secondary | ICD-10-CM | POA: Diagnosis not present

## 2023-04-20 DIAGNOSIS — E559 Vitamin D deficiency, unspecified: Secondary | ICD-10-CM | POA: Diagnosis not present

## 2023-04-20 DIAGNOSIS — M0579 Rheumatoid arthritis with rheumatoid factor of multiple sites without organ or systems involvement: Secondary | ICD-10-CM | POA: Diagnosis not present

## 2023-04-20 DIAGNOSIS — Z6827 Body mass index (BMI) 27.0-27.9, adult: Secondary | ICD-10-CM | POA: Diagnosis not present

## 2023-04-20 DIAGNOSIS — R5383 Other fatigue: Secondary | ICD-10-CM | POA: Diagnosis not present

## 2023-04-25 ENCOUNTER — Telehealth: Payer: Self-pay | Admitting: Psychiatry

## 2023-04-25 ENCOUNTER — Ambulatory Visit: Payer: Medicare PPO | Admitting: Psychiatry

## 2023-04-25 NOTE — Telephone Encounter (Signed)
Mychart

## 2023-04-27 ENCOUNTER — Telehealth: Payer: Self-pay | Admitting: Sports Medicine

## 2023-04-27 NOTE — Telephone Encounter (Signed)
Pt request to be referred to a PHY closer to her home address. Give pt a call at [phone]

## 2023-05-02 NOTE — Telephone Encounter (Signed)
Pt declined a f/u appt with [place] and stated she found a PT office closer to her address.

## 2023-05-04 ENCOUNTER — Ambulatory Visit: Payer: Medicare PPO | Admitting: Cardiology

## 2023-05-04 ENCOUNTER — Encounter: Payer: Self-pay | Admitting: Psychiatry

## 2023-05-04 ENCOUNTER — Ambulatory Visit (INDEPENDENT_AMBULATORY_CARE_PROVIDER_SITE_OTHER): Payer: Medicare PPO | Admitting: Psychiatry

## 2023-05-04 VITALS — BP 135/84 | HR 77 | Ht 63.0 in | Wt 155.8 lb

## 2023-05-04 DIAGNOSIS — R413 Other amnesia: Secondary | ICD-10-CM | POA: Diagnosis not present

## 2023-05-04 DIAGNOSIS — G4710 Hypersomnia, unspecified: Secondary | ICD-10-CM | POA: Diagnosis not present

## 2023-05-04 DIAGNOSIS — G3184 Mild cognitive impairment, so stated: Secondary | ICD-10-CM

## 2023-05-04 NOTE — Patient Instructions (Signed)
A person with mild cognitive impairment (MCI) experiences memory problems greater than normally expected with aging, but not serious enough to interfere with daily activities.  The patient with MCI complains of difficulty with memory. Typically, the complaints include trouble remembering the names of people they met recently, trouble remembering the flow of a conversation, and an increased tendency to misplace things, or similar problems. In many cases, the individual will be aware of these difficulties and will compensate with increased reliance on notes and calendars.   Although there is an increased chances of going on to develop dementia, it is not possible currently to predict with certainty which patients with MCI will or will not go on to develop dementia. There is currently no specific treatment for MCI. People leading sedentary lifestyles are at greater risk for developing dementia. Increased physical activity and brain exercise can help with maintaining brain function.  Tasks to improve attention/working memory 1. Good sleep hygiene (7-8 hrs of sleep) 2. Learning a new skill (Painting, Carpentry, Pottery, new language, Knitting). 3.Cognitive exercises (keep a daily journal, Puzzles) 4. Physical exercise and training  (30 min/day X 4 days week) 5. Being on Antidepressant if needed 6.Yoga, Meditation, Tai Chi 7. Decrease alcohol intake 8.Have a clear schedule and structure in daily routine  MIND Diet: The Mediterranean-DASH Diet Intervention for Neurodegenerative Delay, or MIND diet, targets the health of the aging brain. Research participants with the highest MIND diet scores had a significantly slower rate of cognitive decline compared with those with the lowest scores. The effects of the MIND diet on cognition showed greater effects than either the Mediterranean or the DASH diet alone.  The healthy items the MIND diet guidelines suggest include:  3+ servings a day of whole grains 1+  servings a day of vegetables (other than green leafy) 6+ servings a week of green leafy vegetables 5+ servings a week of nuts 4+ meals a week of beans 2+ servings a week of berries 2+ meals a week of poultry 1+ meals a week of fish Mainly olive oil if added fat is used  The unhealthy items, which are higher in saturated and trans fat, include: Less than 5 servings a week of pastries and sweets Less than 4 servings a week of red meat (including beef, pork, lamb, and products made from these meats) Less than one serving a week of cheese and fried foods Less than 1 tablespoon a day of butter/stick margarine

## 2023-05-04 NOTE — Progress Notes (Signed)
[facility]  PATIENT: Christy [name] DOB: 03/04/1941  REFERRING CLINICIAN: Carilyn Goodpasture, NP HISTORY FROM: self, husband Charles REASON FOR VISIT: memory loss   HISTORICAL  CHIEF COMPLAINT:  Chief Complaint  Patient presents with   Room 16    Pt is here with her Husband Leonette Most. Pt states that her memory started declining when she turned 82 years old. Pt states that both her short term and long term memory has declined. Pt states that she has spells where her chest and her face will get hot and then she will have to grab something so she doesn't fall. Pt states that her spells will happen every other week. Pt states that she has trouble with her sleep and she is waking up every 2 hours at night. Pt states that she has a sharp pain in her sho    HISTORY OF PRESENT ILLNESS:  The patient presents for evaluation of memory loss which began when she turned 82 years old. She has noticed worsening word finding difficulty and losing her train of thought. Has trouble remember the names of acquaintances. Misplaces objects around the house. Will occasionally forget to take her medications but remembers later that day. Has a little more trouble managing finances lately but is able to pay bills without missing any payments.  For the past 2 years she has had spells where her chest and neck become warm and flushed. Stops her in her tracks and she will grab something so she doesn't fall. Lasts 5 seconds at a time. Feels shaky afterwards. She is typically standing when this happens. These episodes occur once every 1-2 weeks. She is scheduled for a TTE in October.  TBI:  No past history of TBI Stroke:  no past history of stroke Seizures:  no past history of seizures Sleep: Has trouble sleeping at night and wakes up every 2 hours to use the restroom. Snores at night, did a home sleep study which was negative but has not had an in-lab study. Sleeps a lot during the day. Mood: She has a  history of anxiety and depression. Has taken Prozac for this previously but is not currently on anything.  Functional status: Patient lives with her husband Cooking: Doesn't cook much at baseline, has not left the stove on Bathing: Leaves water running in the bathroom. Has left it on for 30 minute and forgotten about it. No issues with bathing or using the bathroom. Driving: She drives to familiar places that are close by. Has not gotten lost while she's driving Bills: Most of her bills are autopay. She has had more difficulty managing her bills but has not missed a payment Medications: Sometimes forgets to take her medications. Will typically take them but may take them late Getting lost going to familiar places?: no Forgetting loved ones names?: no Word finding difficulty? yes  OTHER MEDICAL CONDITIONS: breast cancer, anxiety, HLD, DM2   REVIEW OF SYSTEMS: Full 14 system review of systems performed and negative with exception of: memory loss, lightheadedness  ALLERGIES: Allergies  Allergen Reactions   Atorvastatin     Joint pain on 20mg  daily   Epinephrine    Erythromycin    Gluten Swelling    Feet will swell.   Lovaza [Omega-3-Acid Ethyl Esters]     Dizziness and fatiuge   Penicillins    Rosuvastatin     Myalgias and joint pain on 5mg  daily   Tetracyclines & Related     HOME MEDICATIONS: Outpatient Medications Prior to  Visit  Medication Sig Dispense Refill   Bempedoic Acid-Ezetimibe (NEXLIZET) 180-10 MG TABS Take by mouth.     Calcium Citrate 150 MG CAPS Take 1,000 mg by mouth daily.       Cholecalciferol (VITAMIN D) 1000 UNITS capsule Take 1,000 Units by mouth daily.       folic acid (FOLVITE) 1 MG tablet Take 1 mg by mouth daily.       glucose blood (TRUE METRIX BLOOD GLUCOSE TEST) test strip 1 each by Other route as directed. Use as instructed 100 each 3   metFORMIN (GLUCOPHAGE) 500 MG tablet Take 500 mg by mouth 2 (two) times daily with a meal.       methotrexate  (RHEUMATREX) 2.5 MG tablet Take 2.5 mg by mouth. Caution:Chemotherapy. Protect from light.      MULTIPLE VITAMIN PO Take 1 tablet by mouth daily.       PSYLLIUM PO Take 8-10 capsules by mouth daily.       RYBELSUS 14 MG TABS Take 14 mg by mouth daily.     Bempedoic Acid-Ezetimibe (NEXLIZET) 180-10 MG TABS Take 1 tablet by mouth daily. 30 tablet 6   Evolocumab (REPATHA SURECLICK) 140 MG/ML SOAJ Inject 140 mg into the skin every 14 (fourteen) days. 6 mL 3   No facility-administered medications prior to visit.    PAST MEDICAL HISTORY: Past Medical History:  Diagnosis Date   Agatston coronary artery calcium score greater than 400    Ca score 1121 in July 2022   Allergy    Arthritis    Chronic constipation    Depression    Diabetes mellitus without complication (HCC)    History of breast cancer    HLD (hyperlipidemia)    PAT (paroxysmal atrial tachycardia)    Ziopatch 02/2021   Pulmonary insufficiency    moderate by echo 11/23   Rheumatoid arthritis (HCC)     PAST SURGICAL HISTORY: Past Surgical History:  Procedure Laterality Date   ABDOMINAL HYSTERECTOMY     ACHILLES TENDON REPAIR     BREAST REDUCTION SURGERY Left    CATARACT EXTRACTION     masectomy Right    TONSILLECTOMY AND ADENOIDECTOMY      FAMILY HISTORY: Family History  Problem Relation Age of Onset   Arthritis Mother    Dementia Mother    Stroke Mother    Alcohol abuse Father    Cancer Father        lung   Arthritis Sister    Cancer Sister    Early death Sister    Learning disabilities Son    Depression Son    Diabetes Son    Arthritis Maternal Grandmother    Hearing loss Maternal Grandmother    Heart attack Maternal Grandfather     SOCIAL HISTORY: Social History   Socioeconomic History   Marital status: Married    Spouse name: Not on file   Number of children: 2   Years of education: Not on file   Highest education level: Master's degree (e.g., MA, MS, MEng, MEd, MSW, MBA)  Occupational History    Not on file  Tobacco Use   Smoking status: Never   Smokeless tobacco: Never  Vaping Use   Vaping status: Never Used  Substance and Sexual Activity   Alcohol use: Not Currently   Drug use: Not Currently   Sexual activity: Not Currently  Other Topics Concern   Not on file  Social History Narrative   Right Handed   No Caffeine  Use    Social Determinants of Health   Financial Resource Strain: Low Risk  (11/12/2022)   Overall Financial Resource Strain (CARDIA)    Difficulty of Paying Living Expenses: Not hard at all  Food Insecurity: No Food Insecurity (11/12/2022)   Hunger Vital Sign    Worried About Running Out of Food in the Last Year: Never true    Ran Out of Food in the Last Year: Never true  Transportation Needs: No Transportation Needs (11/12/2022)   PRAPARE - Administrator, Civil Service (Medical): No    Lack of Transportation (Non-Medical): No  Physical Activity: Insufficiently Active (11/12/2022)   Exercise Vital Sign    Days of Exercise per Week: 3 days    Minutes of Exercise per Session: 20 min  Stress: Stress Concern Present (11/12/2022)   [employer] of Occupational Health - Occupational Stress Questionnaire    Feeling of Stress : To some extent  Social Connections: Unknown (11/12/2022)   Social Connection and Isolation Panel [NHANES]    Frequency of Communication with Friends and Family: More than three times a week    Frequency of Social Gatherings with Friends and Family: More than three times a week    Attends Religious Services: More than 4 times per year    Active Member of [employer] or Organizations: Not on file    Attends Banker Meetings: Not on file    Marital Status: Married  Catering manager Violence: Not on file     PHYSICAL EXAM  GENERAL EXAM/CONSTITUTIONAL: Vitals:  Vitals:   05/04/23 1357  BP: 135/84  Pulse: 77  Weight: 155 lb 12.8 oz (70.7 kg)  Height: 5\' 3"  (1.6 m)   Body mass index is 27.6 kg/m. Wt Readings  from Last 3 Encounters:  05/04/23 155 lb 12.8 oz (70.7 kg)  04/01/23 156 lb 3.2 oz (70.9 kg)  11/12/22 165 lb 9.6 oz (75.1 kg)    NEUROLOGIC: MENTAL STATUS:     05/04/2023    2:00 PM  Montreal Cognitive Assessment   Visuospatial/ Executive (0/5) 5  Naming (0/3) 3  Attention: Read list of digits (0/2) 2  Attention: Read list of letters (0/1) 1  Attention: Serial 7 subtraction starting at 100 (0/3) 3  Language: Repeat phrase (0/2) 1  Language : Fluency (0/1) 1  Abstraction (0/2) 2  Delayed Recall (0/5) 0  Orientation (0/6) 6  Total 24  Adjusted Score (based on education) 24    CRANIAL NERVE:  2nd, 3rd, 4th, 6th - pupils equal and reactive to light, visual fields full to confrontation, extraocular muscles intact, no nystagmus 5th - facial sensation symmetric 7th - facial strength symmetric 8th - hearing intact 9th - palate elevates symmetrically, uvula midline 11th - shoulder shrug symmetric 12th - tongue protrusion midline  MOTOR:  normal bulk and tone, no cogwheeling, full strength in the BUE, BLE  SENSORY:  normal and symmetric to light touch all 4 extremities  COORDINATION:  finger-nose-finger, fine finger movements normal, no tremor  REFLEXES:  deep tendon reflexes present and symmetric  GAIT/STATION:  Shuffling gait. States she is cautious due to fear of falling     DIAGNOSTIC DATA (LABS, IMAGING, TESTING) - I reviewed patient records, labs, notes, testing and imaging myself where available.  02/2023 CMP wnl  Lab Results  Component Value Date   WBC 7.0 05/10/2022   HGB 11.5 (L) 05/10/2022   HCT 34.2 (L) 05/10/2022   MCV 93.3 05/10/2022   PLT 240.0 05/10/2022  Component Value Date/Time   NA 138 05/10/2022 1630   NA 139 02/19/2022 0000   K 4.0 05/10/2022 1630   CL 104 05/10/2022 1630   CO2 27 05/10/2022 1630   GLUCOSE 187 (H) 05/10/2022 1630   BUN 19 05/10/2022 1630   BUN 20 02/19/2022 0000   CREATININE 0.93 05/10/2022 1630   CALCIUM 9.6  05/10/2022 1630   PROT 7.1 05/10/2022 1630   ALBUMIN 4.0 05/10/2022 1630   AST 17 05/10/2022 1630   ALT 13 05/10/2022 1630   ALKPHOS 73 05/10/2022 1630   BILITOT 0.4 05/10/2022 1630   Lab Results  Component Value Date   CHOL 186 01/24/2023   HDL 87 01/24/2023   LDLCALC 88 01/24/2023   TRIG 57 01/24/2023   CHOLHDL 2.1 01/24/2023   Lab Results  Component Value Date   HGBA1C 7.4 (H) 05/10/2022   Lab Results  Component Value Date   VITAMINB12 401 05/11/2022   Lab Results  Component Value Date   TSH 1.40 05/11/2022     ASSESSMENT AND PLAN  82 y.o. female with a history of breast cancer, anxiety, HLD, DM2 who presents for evaluation of memory loss over the past year. She also reports frequent spells of feeling flushed and lightheaded. Her MOCA today is 24/30, consistent with mild cognitive impairment. She is able to perform all of her ADLs independently. Will order brain MRI to rule out structural causes of memory loss and flushing/?presyncopal episodes as she does have a history of breast cancer. Could consider EEG if no other cause is found, though would expect additional symptoms such as confusion with a seizure. Suspect her fragmented sleep and untreated anxiety/depression are contributing to her memory issues. She had a home sleep study which was negative, but is interested in seeing our Sleep team to determine if an in-lab sleep study would be helpful. Encouraged her to follow up with her PCP regarding restarting an antidepressant.   1. Hypersomnia   2. Memory loss       PLAN: - Labs: B12, TSH - MRI brain  - Referral to Sleep team for hypersomnia and snoring  Orders Placed This Encounter  Procedures   MR BRAIN W WO CONTRAST   Vitamin B12   TSH   Ambulatory referral to Neurology    No orders of the defined types were placed in this encounter.   Return in about 6 months (around 11/01/2023).  I spent an average of 68 minutes chart reviewing and counseling the  patient, with at least 50% of the time face to face with the patient. General brain health measures discussed, including the importance of regular aerobic exercise. Reviewed safety measures including driving safety.   Ocie Doyne, MD 05/04/23 3:17 PM  [facility] [address], [address] [place], [place] [address] [phone]

## 2023-05-05 ENCOUNTER — Telehealth: Payer: Self-pay | Admitting: Psychiatry

## 2023-05-05 LAB — TSH: TSH: 1.8 u[IU]/mL (ref 0.450–4.500)

## 2023-05-05 LAB — VITAMIN B12: Vitamin B-12: 543 pg/mL (ref 232–1245)

## 2023-05-05 NOTE — Telephone Encounter (Signed)
Francine Graven Berkley Harvey: [phone] exp. 05/05/23-07/04/23, [employer] NPR sent to [facility] [phone]

## 2023-05-13 ENCOUNTER — Ambulatory Visit: Payer: Medicare PPO | Admitting: Sports Medicine

## 2023-05-18 ENCOUNTER — Ambulatory Visit (HOSPITAL_COMMUNITY): Payer: Medicare PPO | Attending: Cardiovascular Disease

## 2023-05-18 DIAGNOSIS — E7800 Pure hypercholesterolemia, unspecified: Secondary | ICD-10-CM | POA: Diagnosis not present

## 2023-05-18 DIAGNOSIS — G4719 Other hypersomnia: Secondary | ICD-10-CM

## 2023-05-18 DIAGNOSIS — I4719 Other supraventricular tachycardia: Secondary | ICD-10-CM

## 2023-05-18 DIAGNOSIS — R55 Syncope and collapse: Secondary | ICD-10-CM | POA: Diagnosis not present

## 2023-05-18 DIAGNOSIS — I10 Essential (primary) hypertension: Secondary | ICD-10-CM | POA: Diagnosis not present

## 2023-05-18 DIAGNOSIS — E785 Hyperlipidemia, unspecified: Secondary | ICD-10-CM

## 2023-05-19 LAB — ECHOCARDIOGRAM COMPLETE
Area-P 1/2: 2.85 cm2
S' Lateral: 2 cm

## 2023-05-23 ENCOUNTER — Encounter: Payer: Self-pay | Admitting: Sports Medicine

## 2023-05-23 ENCOUNTER — Ambulatory Visit: Payer: Medicare PPO | Admitting: Sports Medicine

## 2023-05-23 DIAGNOSIS — M25511 Pain in right shoulder: Secondary | ICD-10-CM

## 2023-05-23 DIAGNOSIS — G8929 Other chronic pain: Secondary | ICD-10-CM

## 2023-05-23 DIAGNOSIS — M19011 Primary osteoarthritis, right shoulder: Secondary | ICD-10-CM | POA: Diagnosis not present

## 2023-05-23 DIAGNOSIS — M25512 Pain in left shoulder: Secondary | ICD-10-CM

## 2023-05-23 MED ORDER — CELECOXIB 100 MG PO CAPS: 100.0000 mg | ORAL_CAPSULE | Freq: Every day | ORAL | 0 refills | Status: DC | PRN

## 2023-05-23 MED ORDER — CELECOXIB 100 MG PO CAPS: 100.0000 mg | ORAL_CAPSULE | Freq: Two times a day (BID) | ORAL | 0 refills | Status: DC | PRN

## 2023-05-23 NOTE — Progress Notes (Signed)
Christy [name] - 82 y.o. female MRN [id number]  Date of birth: 06-23-1941  Office Visit Note: Visit Date: 05/23/2023 PCP: Mliss Sax, MD Referred by: Mliss Sax,*  Subjective: Chief Complaint  Patient presents with   Right Shoulder - Pain   HPI: Christy [name] is a pleasant 82 y.o. female who presents today for bilateral, right greater than left shoulder pain.  Right shoulder is the one that has been giving her issues for many months, left 1 only bothers her very intermittently and is not as bothersome.  She did receive decent relief from subacromial joint injection but only lasted for a week or 2 and then her pain returned.  She would like to start formalized physical therapy.  She is on methotrexate 2.5 mg daily for her rheumatoid arthritis.  Has used Tylenol in the past but no consistent pain medication.  Pertinent ROS were reviewed with the patient and found to be negative unless otherwise specified above in HPI.   Assessment & Plan: Visit Diagnoses:  1. Primary osteoarthritis, right shoulder   2. Chronic pain of both shoulders    Plan: Discussed with Christy [name] that I do think formalized physical therapy for bilateral shoulders would be helpful for her.  Her right shoulder she has a combination of glenohumeral joint arthritis as well as some rotator cuff arthropathy.  We will get her started in PT for this pain in the left shoulder.  The left shoulder was not very bothersome, if this continues to be give her pain we could consider x-ray imaging in the future.  Only for breakthrough as needed pain, she may take Celebrex 100 mg daily as needed, this was sent into her pharmacy today.  We will see her back in about 6 weeks to reevaluate both shoulders.  Did discuss that given her arthritic change in the right shoulder, could consider an ultrasound-guided glenohumeral joint injection in the future if things do not settle down with physical therapy.  She is agreeable but we  will hold on this for now.  Additional treatment considerations: MRI of the shoulder to evaluate the rotator cuff  Follow-up: Return in about 6 weeks (around 07/04/2023) for R > L shoulder pain (30-min for poss inj).   Meds & Orders:  Meds ordered this encounter  Medications   DISCONTD: celecoxib (CELEBREX) 100 MG capsule    Sig: Take 1 capsule (100 mg total) by mouth 2 (two) times daily as needed.    Dispense:  60 capsule    Refill:  0   celecoxib (CELEBREX) 100 MG capsule    Sig: Take 1 capsule (100 mg total) by mouth daily as needed.    Dispense:  60 capsule    Refill:  0    Orders Placed This Encounter  Procedures   Ambulatory referral to Physical Therapy     Procedures: No procedures performed      Clinical History: No specialty comments available.  She reports that she has never smoked. She has never used smokeless tobacco. No results for input(s): "HGBA1C", "LABURIC" in the last 8760 hours.  Objective:    Physical Exam  Gen: Well-appearing, in no acute distress; non-toxic CV:  Well-perfused. Warm.  Resp: Breathing unlabored on room air; no wheezing. Psych: Fluid speech in conversation; appropriate affect; normal thought process Neuro: Sensation intact throughout. No gross coordination deficits.   Ortho Exam - Bilateral shoulders: No bony TTP or AC joint TTP.  There is some limited active range of motion of the  right greater than left shoulder with forward flexion and abduction, the right shoulder has about 10 degrees less of passive range of motion with some grating at end range.  There is some pain with drop arm test, pain and weakness with resisted abduction and external rotation, internal rotation is preserved bilaterally.  Right shoulder with thumb to L4.  The thumb to elbow Xu on the contralateral arm.  Imaging:  - 12/03/22 (R-shoulder): 3-views including AP, Y and axial views were ordered and reviewed by myself.   X-rays show mild to moderate glenohumeral  joint space narrowing with  arthritic change.  There is a slightly higher pain indicative of rotator  cuff arthropathy.  Mild AC joint arthropathy.  No acute fracture or bony  abnormality noted   Past Medical/Family/Surgical/Social History: Medications & Allergies reviewed per EMR, new medications updated. Patient Active Problem List   Diagnosis Date Noted   Lip swelling 09/28/2022   Cognitive decline 09/02/2022   Insomnia 09/02/2022   Hearing loss 09/02/2022   Acute pain of left knee 05/10/2022   Rheumatoid arthritis of right foot (HCC) 03/31/2022   Type 2 diabetes mellitus with diabetic neuropathy, without long-term current use of insulin (HCC) 03/31/2022   Anxiety and depression 03/31/2022   Diarrhea of presumed infectious origin 03/31/2022   History of breast cancer 03/31/2022   Pulmonary insufficiency 05/13/2021   PAT (paroxysmal atrial tachycardia) (HCC) 04/14/2021   Agatston coronary artery calcium score greater than 400 04/14/2021   HLD (hyperlipidemia) 04/14/2021   Past Medical History:  Diagnosis Date   Agatston coronary artery calcium score greater than 400    Ca score 1121 in July 2022   Allergy    Arthritis    Chronic constipation    Depression    Diabetes mellitus without complication (HCC)    History of breast cancer    HLD (hyperlipidemia)    PAT (paroxysmal atrial tachycardia) (HCC)    Ziopatch 02/2021   Pulmonary insufficiency    moderate by echo 11/23   Rheumatoid arthritis (HCC)    Family History  Problem Relation Age of Onset   Arthritis Mother    Dementia Mother    Stroke Mother    Alcohol abuse Father    Cancer Father        lung   Arthritis Sister    Cancer Sister    Early death Sister    Learning disabilities Son    Depression Son    Diabetes Son    Arthritis Maternal Grandmother    Hearing loss Maternal Grandmother    Heart attack Maternal Grandfather    Past Surgical History:  Procedure Laterality Date   ABDOMINAL HYSTERECTOMY      ACHILLES TENDON REPAIR     BREAST REDUCTION SURGERY Left    CATARACT EXTRACTION     masectomy Right    TONSILLECTOMY AND ADENOIDECTOMY     Social History   Occupational History   Not on file  Tobacco Use   Smoking status: Never   Smokeless tobacco: Never  Vaping Use   Vaping status: Never Used  Substance and Sexual Activity   Alcohol use: Not Currently   Drug use: Not Currently   Sexual activity: Not Currently

## 2023-05-23 NOTE — Progress Notes (Signed)
Patient says that she would like a referral to start physical therapy. She says that her right shoulder had some relief from injection but it did not last long, and every once and awhile her left shoulder also bothers her. She says that it is worst when trying to get dressed and undressed.

## 2023-05-24 ENCOUNTER — Telehealth: Payer: Self-pay

## 2023-05-24 NOTE — Telephone Encounter (Signed)
-----   Message from Armanda Magic sent at 05/24/2023  2:57 PM EDT ----- I reviewed with some of the advanced imagers - there may be a very small fibroelastoma but if no hx of CVA/TIA and give her advanced age would not do any further workup at this time ----- Message ----- From: Bunnie Domino Sent: 05/20/2023   1:20 PM EDT To: Quintella Reichert, MD; [address] Triage  Ms. Daniel, Your heart pump function is normal.  You have mild thickening of the left ventricle.  Please be mindful of your blood pressure.  You also have slightly impaired relaxation of the heart which is common as we age.  The left upper chamber of your heart is moderately enlarged.  You have a trivial leak of your mitral valve.  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  You had moderate leak of your pulmonic valve.  I will forward to Dr. Mayford Knife as well for any additional recommendations.  Sharlene Dory, PA-C

## 2023-05-24 NOTE — Telephone Encounter (Signed)
Attempted to contact patient, left message to contact our office back

## 2023-05-25 NOTE — Telephone Encounter (Signed)
Reviewed with patient that heart pump function is normal. You have mild thickening of the left ventricle-advised this can be related to elevated blood pressure. Patient verbalizes understanding of slightly impaired relaxation of the heart which is common as we age. And enlargement of the left upper chamber of heart. 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 Explained per Dr. Mayford Knife since she does not have any history of CVA/TIA this is not high risk at this time. Patient also verbalizes understanding of moderate leak of your pulmonic valve.

## 2023-05-25 NOTE — Telephone Encounter (Signed)
Patient is returning call. Please advise?

## 2023-06-12 ENCOUNTER — Ambulatory Visit
Admission: RE | Admit: 2023-06-12 | Discharge: 2023-06-12 | Disposition: A | Discharge status: Home / Self Care | Payer: Medicare PPO | Source: Ambulatory Visit | Attending: Psychiatry | Admitting: Psychiatry

## 2023-06-12 DIAGNOSIS — R413 Other amnesia: Secondary | ICD-10-CM | POA: Diagnosis not present

## 2023-06-12 MED ORDER — GADOPICLENOL 0.5 MMOL/ML IV SOLN
7.5000 mL | Freq: Once | INTRAVENOUS | Status: AC | PRN
  Administered 2023-06-12: 7.5 mL via INTRAVENOUS

## 2023-06-14 ENCOUNTER — Telehealth: Payer: Self-pay | Admitting: Sports Medicine

## 2023-06-14 ENCOUNTER — Other Ambulatory Visit: Payer: Self-pay | Admitting: Sports Medicine

## 2023-06-14 DIAGNOSIS — M19011 Primary osteoarthritis, right shoulder: Secondary | ICD-10-CM

## 2023-06-14 DIAGNOSIS — G8929 Other chronic pain: Secondary | ICD-10-CM

## 2023-06-14 NOTE — Telephone Encounter (Signed)
Patient called. Would like a PT referral sent to [facility]. Her cb# [phone]

## 2023-06-14 NOTE — Telephone Encounter (Signed)
Referral sent to [facility] for chronic bilateral shoulder pain and right shoulder arthritis

## 2023-06-15 ENCOUNTER — Encounter: Payer: Self-pay | Admitting: *Deleted

## 2023-06-21 DIAGNOSIS — F419 Anxiety disorder, unspecified: Secondary | ICD-10-CM | POA: Diagnosis not present

## 2023-06-21 DIAGNOSIS — Z79899 Other long term (current) drug therapy: Secondary | ICD-10-CM | POA: Diagnosis not present

## 2023-06-21 DIAGNOSIS — M069 Rheumatoid arthritis, unspecified: Secondary | ICD-10-CM | POA: Diagnosis not present

## 2023-06-21 DIAGNOSIS — D84821 Immunodeficiency due to drugs: Secondary | ICD-10-CM | POA: Diagnosis not present

## 2023-06-21 DIAGNOSIS — R413 Other amnesia: Secondary | ICD-10-CM | POA: Diagnosis not present

## 2023-06-23 DIAGNOSIS — Z1231 Encounter for screening mammogram for malignant neoplasm of breast: Secondary | ICD-10-CM | POA: Diagnosis not present

## 2023-06-27 IMAGING — CT CT CARDIAC CORONARY ARTERY CALCIUM SCORE
3 series · 14 of 20 positions shown, 16 images · non-contrast
Comparison: None.

CLINICAL DATA: 79-year-old white female with pure
hypercholesterolemia.

EXAM:
CT CARDIAC CORONARY ARTERY CALCIUM SCORE
TECHNIQUE: Non-contrast imaging through the heart was performed using
prospective ECG gating. Image post processing was performed on an
independent workstation, allowing for quantitative analysis of the
heart and coronary arteries. Note that this exam targets the heart
and the chest was not imaged in its entirety.

[Series 2: calcium scoring 2.00 qr36 bestdiast 70% hrt calciu · axial · 0.36mm/px · z∈[+1845,+1921]mm · 4 of 64 slices shown]
[im 13/64  vessel]
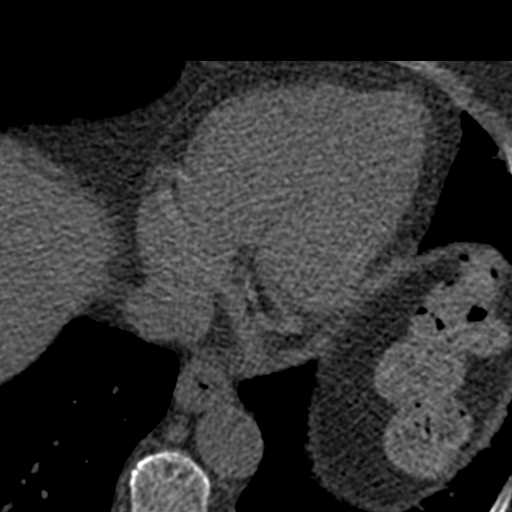
[im 26/64  vessel]
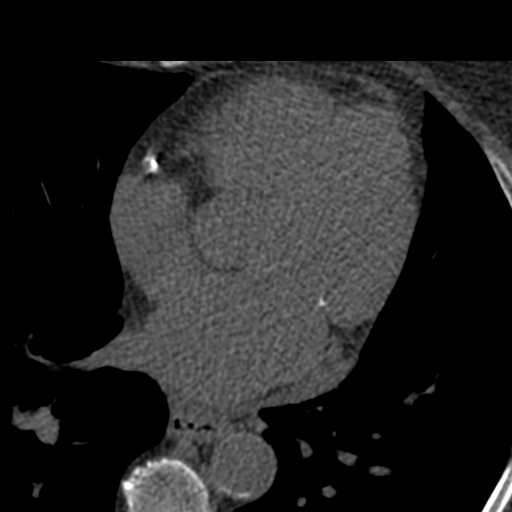
[im 38/64  vessel]
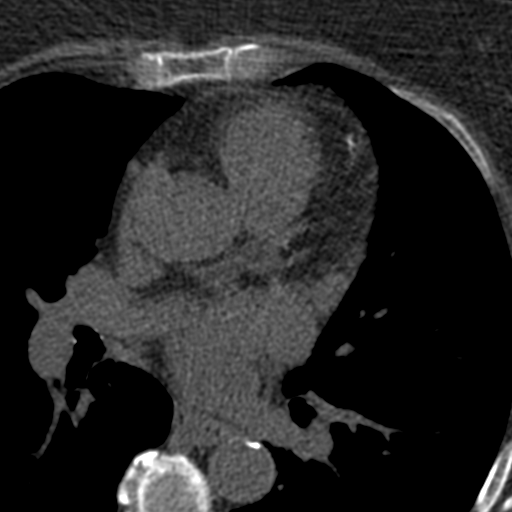
[im 51/64  vessel]
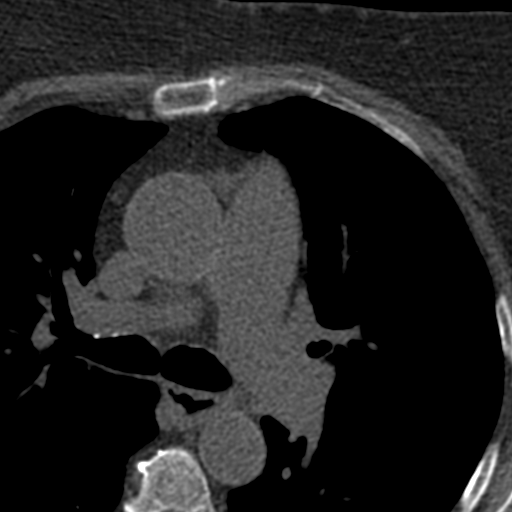

[Series 3: calcium scoring 2.00 br40 bestdiast 70% axial · axial · 0.64mm/px · z∈[+1841,+1925]mm · 5 of 64 slices shown, 7 images]
[im 11/64  vessel]
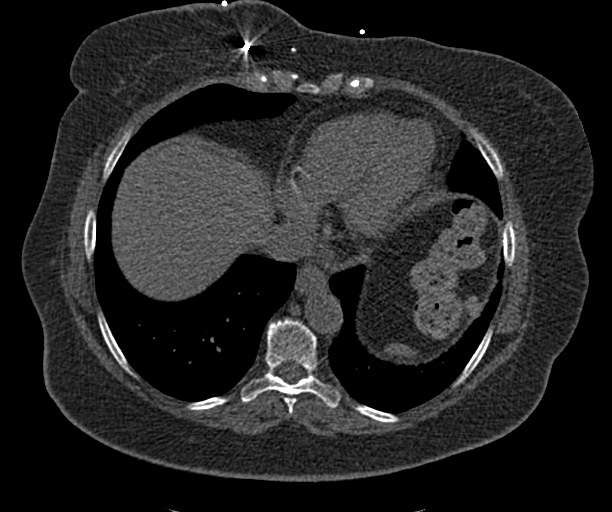
[im 11/64  lung]
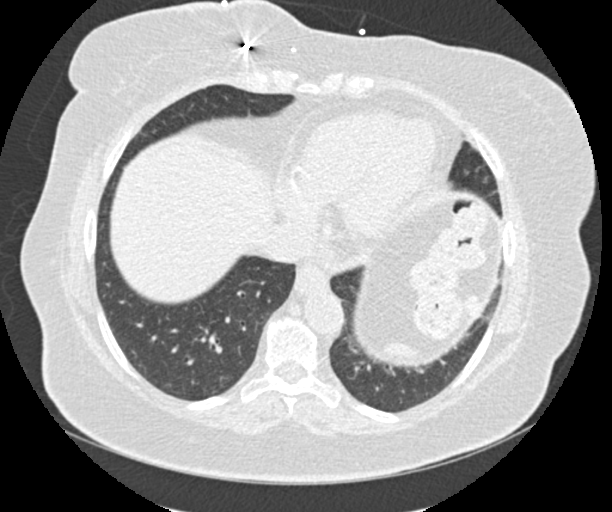
[im 22/64  vessel]
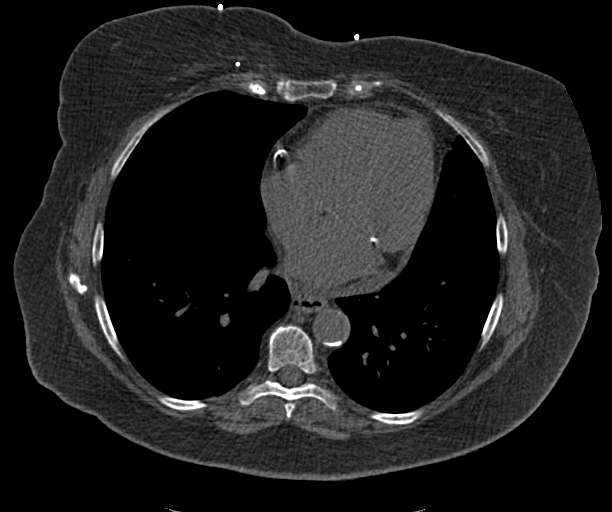
[im 32/64  vessel]
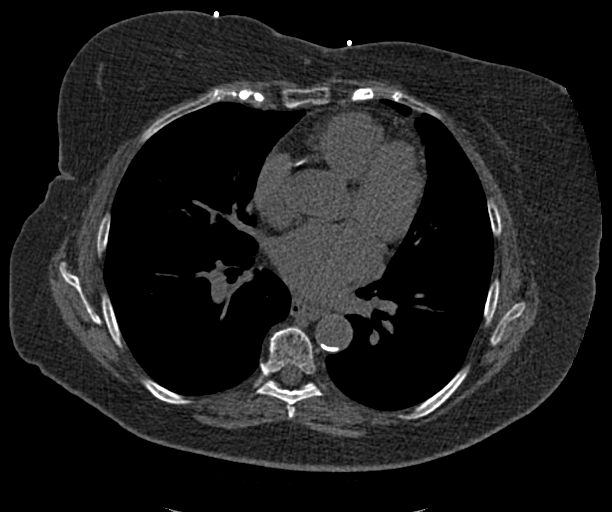
[im 43/64  vessel]
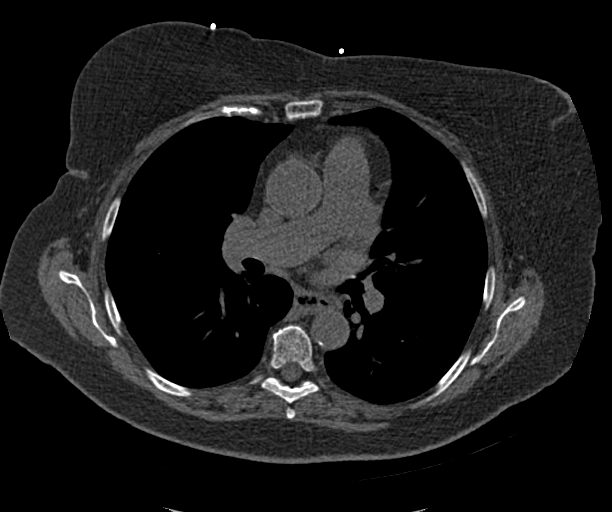
[im 53/64  vessel]
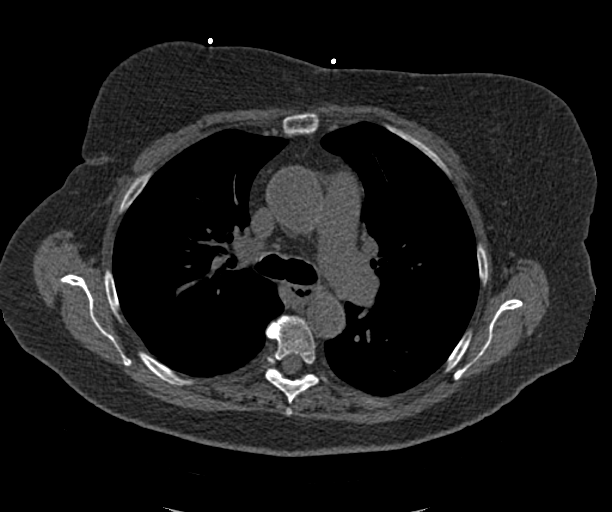
[im 53/64  lung]
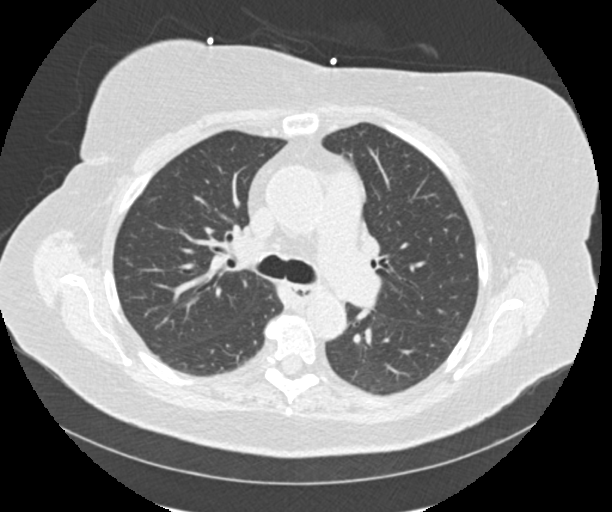

[Series 9: calcium scoring 2.00 br60 bestdiast 70% lungs · axial · 0.64mm/px · z∈[+1841,+1925]mm · 5 of 64 slices shown]
[im 11/64  vessel]
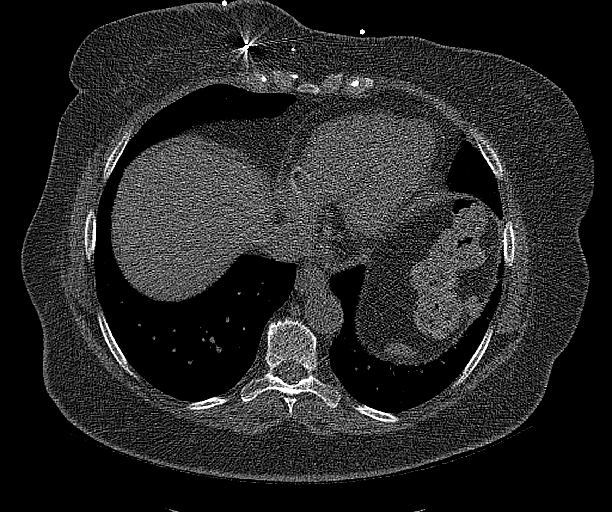
[im 22/64  vessel]
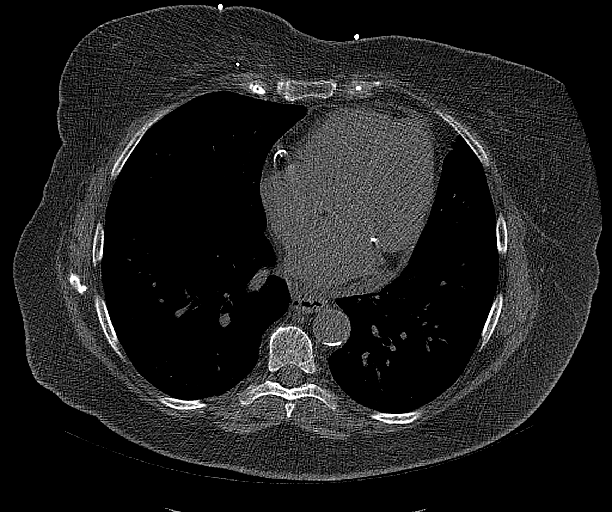
[im 32/64  vessel]
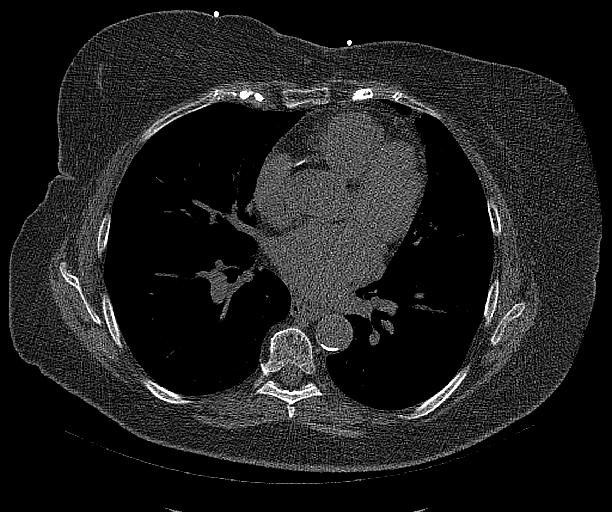
[im 43/64  vessel]
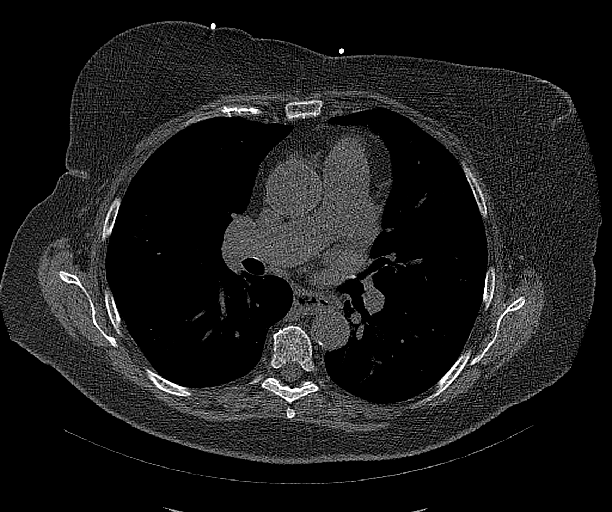
[im 53/64  vessel]
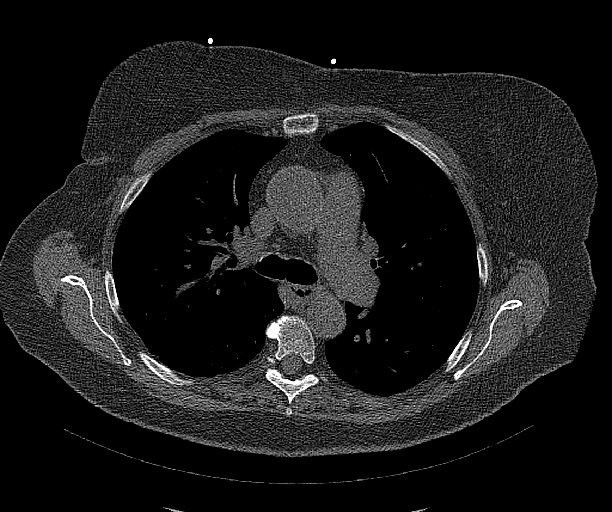

[14 of 20 positions shown; findings below may reference images not displayed]

FINDINGS: CORONARY CALCIUM SCORES:

Left Main: 0

LAD: 120

LCx:

RCA: 995

Total Agatston Score: 5535

[HOSPITAL] percentile: 92

AORTA MEASUREMENTS:

Ascending Aorta: 36 mm

Descending Aorta: 25 mm

OTHER FINDINGS:

Atherosclerotic calcifications involving the thoracic aorta. Heart
size is normal with minimal pericardial fluid. Evidence for mitral
annular calcifications. Visualized mediastinal structures are
normal. Surgical clips in the right axilla. Surgical clips in the
right breast. Images of upper abdomen are unremarkable. Visualized
lungs are clear. No pleural effusions. No acute bone abnormality.
IMPRESSION: 1. Coronary calcium score is 5535 and this is at percentile 92 for
patients of the same age, gender and ethnicity.
2.  Aortic Atherosclerosis ([id number].G).

## 2023-06-27 NOTE — Therapy (Signed)
OUTPATIENT PHYSICAL THERAPY SHOULDER EVALUATION   Patient Name: Christy [name] MRN: [id number] DOB:1941-07-09, 82 y.o., female Today's Date: 06/28/2023  END OF SESSION:  PT End of Session - 06/28/23 1308     Visit Number 1    Date for PT Re-Evaluation 09/20/23    Authorization Type [employer]    PT Start Time 1308    PT Stop Time 1350    PT Time Calculation (min) 42 min    Activity Tolerance Patient tolerated treatment well;Patient limited by pain    Behavior During Therapy [facility] for tasks assessed/performed             Past Medical History:  Diagnosis Date   Agatston coronary artery calcium score greater than 400    Ca score 1121 in July 2022   Allergy    Arthritis    Chronic constipation    Depression    Diabetes mellitus without complication (HCC)    History of breast cancer    HLD (hyperlipidemia)    PAT (paroxysmal atrial tachycardia) (HCC)    Ziopatch 02/2021   Pulmonary insufficiency    moderate by echo 11/23   Rheumatoid arthritis (HCC)    Past Surgical History:  Procedure Laterality Date   ABDOMINAL HYSTERECTOMY     ACHILLES TENDON REPAIR     BREAST REDUCTION SURGERY Left    CATARACT EXTRACTION     masectomy Right    TONSILLECTOMY AND ADENOIDECTOMY     Patient Active Problem List   Diagnosis Date Noted   Lip swelling 09/28/2022   Cognitive decline 09/02/2022   Insomnia 09/02/2022   Hearing loss 09/02/2022   Acute pain of left knee 05/10/2022   Rheumatoid arthritis of right foot (HCC) 03/31/2022   Type 2 diabetes mellitus with diabetic neuropathy, without long-term current use of insulin (HCC) 03/31/2022   Anxiety and depression 03/31/2022   Diarrhea of presumed infectious origin 03/31/2022   History of breast cancer 03/31/2022   Pulmonary insufficiency 05/13/2021   PAT (paroxysmal atrial tachycardia) (HCC) 04/14/2021   Agatston coronary artery calcium score greater than 400 04/14/2021   HLD (hyperlipidemia) 04/14/2021    PCP: Carilyn Goodpasture  REFERRING PROVIDER: Madelyn Brunner  REFERRING DIAG: chronic bilateral shoulder pain, right shoulder arthritis   THERAPY DIAG:  Chronic right shoulder pain  Stiffness of right shoulder, not elsewhere classified  Muscle weakness (generalized)  Chronic left shoulder pain  Rationale for Evaluation and Treatment: Rehabilitation  ONSET DATE: "six months"  SUBJECTIVE:  SUBJECTIVE STATEMENT: I am okay, pain in my shoulders. Mostly the right ones but it will hit the left one. My hands are also painful, it is the arthritis and hard for me to grab things but it has been like that for like 50 years now. The shoulder pain has gotten worse the last six months.   Hand dominance: Right  PERTINENT HISTORY: Christy [name] is a pleasant 82 y.o. female who presents today for bilateral, right greater than left shoulder pain. Right shoulder is the one that has been giving her issues for many months, left 1 only bothers her very intermittently and is not as bothersome.  She did receive decent relief from subacromial joint injection but only lasted for a week or 2 and then her pain returned.  She would like to start formalized physical therapy.  She is on methotrexate 2.5 mg daily for her rheumatoid arthritis.  Has used Tylenol in the past but no consistent pain medication.   Pertinent ROS were reviewed with the patient and found to be negative unless otherwise specified above in HPI.   PAIN:  Are you having pain? Yes: NPRS scale: 7 when it hurts/10 Pain location: R shoulder and into hand  Pain description: sharp pain especially if I move the wrong way, worse in the mornings  Aggravating factors: reaching overhead, cooking sometimes especially if having to reach in the cupboards Relieving factors: ice packs  PRECAUTIONS:  None  RED FLAGS: None   WEIGHT BEARING RESTRICTIONS: No  FALLS:  Has patient fallen in last 6 months? No  LIVING ENVIRONMENT: Lives with: lives with their family Lives in: House/apartment Stairs: No   PLOF: Independent and Independent with basic ADLs  PATIENT GOALS:to be more normal and have normal movements, not to be concerned with movements hurting  NEXT MD VISIT:   OBJECTIVE:  Note: Objective measures were completed at Evaluation unless otherwise noted.  DIAGNOSTIC FINDINGS:  X-rays show mild to moderate glenohumeral joint space narrowing with  arthritic change.  There is a slightly higher pain indicative of rotator cuff arthropathy.  Mild AC joint arthropathy.  No acute fracture or bony abnormality noted   COGNITION: Overall cognitive status: Within functional limits for tasks assessed     SENSATION: WFL  POSTURE: Rounded shoulders   UPPER EXTREMITY ROM:   Active ROM Right eval Left eval  Shoulder flexion 115 w/pain 160 w/pain  Shoulder extension    Shoulder abduction 80 w/pain [facility]  Shoulder adduction    Shoulder internal rotation 55 w/pain [facility]  Shoulder external rotation 50 w/pain [facility]  Elbow flexion    Elbow extension    Wrist flexion    Wrist extension    Wrist ulnar deviation    Wrist radial deviation    Wrist pronation    Wrist supination    (Blank rows = not tested)  UPPER EXTREMITY MMT:  MMT Right eval Left eval  Shoulder flexion 2- 3-  Shoulder extension    Shoulder abduction 2- 3-  Shoulder adduction    Shoulder internal rotation 3+ 3+  Shoulder external rotation 3+ 3+   Middle trapezius    Lower trapezius    Elbow flexion    Elbow extension    Wrist flexion    Wrist extension    Wrist ulnar deviation    Wrist radial deviation    Wrist pronation    Wrist supination    Grip strength (lbs)    (Blank rows = not tested)  SHOULDER SPECIAL TESTS: Impingement  tests: Neer impingement test: positive , Hawkins/Kennedy impingement  test: positive , and Painful arc test: positive  Rotator cuff assessment: Empty can test: positive   JOINT MOBILITY TESTING:  Increase muscle guarding and pain with PROM   PALPATION:  Some TTP    TODAY'S TREATMENT:                                                                                                                                         DATE: EVAL 06/28/23   PATIENT EDUCATION: Education details: POC and HEP Person educated: Patient Education method: Explanation Education comprehension: verbalized understanding  HOME EXERCISE PROGRAM: Access Code: [facility] URL: [email or web address] Date: 06/28/2023 Prepared by: Cassie Freer  Exercises - Standing Shoulder Abduction AAROM with Dowel  - 1 x daily - 7 x weekly - 2 sets - 10 reps - Standing Shoulder Extension with Dowel  - 1 x daily - 7 x weekly - 2 sets - 10 reps - Standing Bilateral Shoulder Internal Rotation AAROM with Dowel  - 1 x daily - 7 x weekly - 2 sets - 10 reps - Standing Shoulder Flexion AAROM with Dowel  - 1 x daily - 7 x weekly - 2 sets - 10 reps - Shoulder Internal Rotation with Resistance  - 1 x daily - 7 x weekly - 2 sets - 10 reps - Shoulder External Rotation with Anchored Resistance  - 1 x daily - 7 x weekly - 2 sets - 10 reps  ASSESSMENT:  CLINICAL IMPRESSION: Patient is a 82 y.o. female who was seen today for physical therapy evaluation and treatment for chronic shoulder pain and arthritis. She reports that the pain is not as bad as it was six months ago. The pain is not constant and mostly hurts if she moves the wrong way. She reports the thing the bugs her the most is how difficult it is to get dressed and put clothes on over her head. Patient would also like to be able to use a curling iron again. Currently she presents with decreased ROM and strength and pain mostly with overhead activities. She will benefit from skilled PT to address her deficits to be able to complete her ADLs with  ease.   OBJECTIVE IMPAIRMENTS: decreased mobility, decreased ROM, decreased strength, impaired UE functional use, and pain.   ACTIVITY LIMITATIONS: carrying, lifting, dressing, reach over head, and hygiene/grooming  PARTICIPATION LIMITATIONS: cleaning, laundry, shopping, community activity, and yard work  [facility] POTENTIAL: Good  CLINICAL DECISION MAKING: Stable/uncomplicated  EVALUATION COMPLEXITY: Low   GOALS: Goals reviewed with patient? Yes  SHORT TERM GOALS: Target date: 08/09/23  Patient will be independent with initial HEP.  Baseline: given 06/28/23  Goal status: INITIAL   LONG TERM GOALS: Target date: 09/20/23  Patient will be independent with advanced/ongoing HEP to improve outcomes and carryover.  Baseline:  Goal status: INITIAL  2.  Patient will report 75%  improvement in R shoulder pain to improve QOL.  Baseline: 7/10 Goal status: INITIAL  3.  Patient to improve R shoulder AROM to [facility] without pain provocation to allow for increased ease of ADLs.  Baseline: see chart Goal status: INITIAL  4.  Patient will demonstrate improved functional UE strength as demonstrated by 4/5 or better. Baseline: see chart Goal status: INITIAL  5.  Patient will be able to put a shirt on over her head.  Baseline: stopped trying Goal status: INITIAL    PLAN:  PT FREQUENCY: 2x/week  PT DURATION: 12 weeks  PLANNED INTERVENTIONS: 97110-Therapeutic exercises, 97530- Therapeutic activity, 97112- Neuromuscular re-education, 97535- Self Care, 16109- Manual therapy, Patient/Family education, Taping, Dry Needling, Joint mobilization, Spinal mobilization, Cryotherapy, and Moist heat  PLAN FOR NEXT SESSION: shoulder AAROM, PROM, light strengthening as tolerated    Cassie Freer, PT 06/28/2023, 1:53 PM

## 2023-06-28 ENCOUNTER — Ambulatory Visit: Payer: Medicare PPO | Attending: Sports Medicine

## 2023-06-28 DIAGNOSIS — M19011 Primary osteoarthritis, right shoulder: Secondary | ICD-10-CM | POA: Diagnosis not present

## 2023-06-28 DIAGNOSIS — M25512 Pain in left shoulder: Secondary | ICD-10-CM | POA: Insufficient documentation

## 2023-06-28 DIAGNOSIS — M25611 Stiffness of right shoulder, not elsewhere classified: Secondary | ICD-10-CM | POA: Diagnosis not present

## 2023-06-28 DIAGNOSIS — G8929 Other chronic pain: Secondary | ICD-10-CM | POA: Diagnosis not present

## 2023-06-28 DIAGNOSIS — M25511 Pain in right shoulder: Secondary | ICD-10-CM | POA: Insufficient documentation

## 2023-06-28 DIAGNOSIS — M6281 Muscle weakness (generalized): Secondary | ICD-10-CM | POA: Insufficient documentation

## 2023-06-30 ENCOUNTER — Ambulatory Visit: Payer: Medicare PPO | Admitting: Physical Therapy

## 2023-07-01 ENCOUNTER — Ambulatory Visit: Payer: Medicare PPO | Attending: Physician Assistant

## 2023-07-01 DIAGNOSIS — I4719 Other supraventricular tachycardia: Secondary | ICD-10-CM

## 2023-07-01 DIAGNOSIS — R55 Syncope and collapse: Secondary | ICD-10-CM | POA: Diagnosis not present

## 2023-07-01 DIAGNOSIS — I10 Essential (primary) hypertension: Secondary | ICD-10-CM

## 2023-07-01 DIAGNOSIS — E785 Hyperlipidemia, unspecified: Secondary | ICD-10-CM

## 2023-07-01 DIAGNOSIS — E7800 Pure hypercholesterolemia, unspecified: Secondary | ICD-10-CM

## 2023-07-01 DIAGNOSIS — G4719 Other hypersomnia: Secondary | ICD-10-CM

## 2023-07-02 LAB — LIPID PANEL
Chol/HDL Ratio: 1.9 ratio (ref 0.0–4.4)
Cholesterol, Total: 164 mg/dL (ref 100–199)
HDL: 87 mg/dL (ref >39)
LDL Chol Calc (NIH): 67 mg/dL (ref 0–99)
Triglycerides: 47 mg/dL (ref 0–149)
VLDL Cholesterol Cal: 10 mg/dL (ref 5–40)

## 2023-07-02 LAB — HEPATIC FUNCTION PANEL
ALT: 8 [IU]/L (ref 0–32)
AST: 13 [IU]/L (ref 0–40)
Albumin: 4.1 g/dL (ref 3.7–4.7)
Alkaline Phosphatase: 58 [IU]/L (ref 44–121)
Bilirubin Total: 0.3 mg/dL (ref 0.0–1.2)
Bilirubin, Direct: 0.18 mg/dL (ref 0.00–0.40)
Total Protein: 6.7 g/dL (ref 6.0–8.5)

## 2023-07-04 ENCOUNTER — Ambulatory Visit: Payer: Medicare PPO | Admitting: Sports Medicine

## 2023-07-12 NOTE — Therapy (Signed)
OUTPATIENT PHYSICAL THERAPY SHOULDER TREATMENT   Patient Name: Christy [name] MRN: [id number] DOB:1941/04/19, 82 y.o., female Today's Date: 07/13/2023  END OF SESSION:  PT End of Session - 07/13/23 1318     Visit Number 2    Date for PT Re-Evaluation 09/20/23    Authorization Type [employer]    Authorization - Visit Number 1    Authorization - Number of Visits 24    PT Start Time 1315    PT Stop Time 1400    PT Time Calculation (min) 45 min    Activity Tolerance Patient tolerated treatment well;Patient limited by pain    Behavior During Therapy [facility] for tasks assessed/performed              Past Medical History:  Diagnosis Date   Agatston coronary artery calcium score greater than 400    Ca score 1121 in July 2022   Allergy    Arthritis    Chronic constipation    Depression    Diabetes mellitus without complication (HCC)    History of breast cancer    HLD (hyperlipidemia)    PAT (paroxysmal atrial tachycardia) (HCC)    Ziopatch 02/2021   Pulmonary insufficiency    moderate by echo 11/23   Rheumatoid arthritis (HCC)    Past Surgical History:  Procedure Laterality Date   ABDOMINAL HYSTERECTOMY     ACHILLES TENDON REPAIR     BREAST REDUCTION SURGERY Left    CATARACT EXTRACTION     masectomy Right    TONSILLECTOMY AND ADENOIDECTOMY     Patient Active Problem List   Diagnosis Date Noted   Lip swelling 09/28/2022   Cognitive decline 09/02/2022   Insomnia 09/02/2022   Hearing loss 09/02/2022   Acute pain of left knee 05/10/2022   Rheumatoid arthritis of right foot (HCC) 03/31/2022   Type 2 diabetes mellitus with diabetic neuropathy, without long-term current use of insulin (HCC) 03/31/2022   Anxiety and depression 03/31/2022   Diarrhea of presumed infectious origin 03/31/2022   History of breast cancer 03/31/2022   Pulmonary insufficiency 05/13/2021   PAT (paroxysmal atrial tachycardia) (HCC) 04/14/2021   Agatston coronary artery calcium score greater than 400  04/14/2021   HLD (hyperlipidemia) 04/14/2021    PCP: Carilyn Goodpasture  REFERRING PROVIDER: Madelyn Brunner  REFERRING DIAG: chronic bilateral shoulder pain, right shoulder arthritis   THERAPY DIAG:  Chronic right shoulder pain  Stiffness of right shoulder, not elsewhere classified  Muscle weakness (generalized)  Chronic left shoulder pain  Rationale for Evaluation and Treatment: Rehabilitation  ONSET DATE: "six months"  SUBJECTIVE:  SUBJECTIVE STATEMENT: Feeling meh today, feeling sore today. I think I slept on the R shoulder.   Hand dominance: Right  PERTINENT HISTORY: Christy [name] is a pleasant 82 y.o. female who presents today for bilateral, right greater than left shoulder pain. Right shoulder is the one that has been giving her issues for many months, left 1 only bothers her very intermittently and is not as bothersome.  She did receive decent relief from subacromial joint injection but only lasted for a week or 2 and then her pain returned.  She would like to start formalized physical therapy.  She is on methotrexate 2.5 mg daily for her rheumatoid arthritis.  Has used Tylenol in the past but no consistent pain medication.   Pertinent ROS were reviewed with the patient and found to be negative unless otherwise specified above in HPI.   PAIN:  Are you having pain? Yes: NPRS scale: 8/10 Pain location: R shoulder and into hand  Pain description: sharp pain especially if I move the wrong way, worse in the mornings  Aggravating factors: reaching overhead, cooking sometimes especially if having to reach in the cupboards Relieving factors: ice packs  PRECAUTIONS: None  RED FLAGS: None   WEIGHT BEARING RESTRICTIONS: No  FALLS:  Has patient fallen in last 6 months? No  LIVING ENVIRONMENT: Lives  with: lives with their family Lives in: House/apartment Stairs: No   PLOF: Independent and Independent with basic ADLs  PATIENT GOALS:to be more normal and have normal movements, not to be concerned with movements hurting  NEXT MD VISIT:   OBJECTIVE:  Note: Objective measures were completed at Evaluation unless otherwise noted.  DIAGNOSTIC FINDINGS:  X-rays show mild to moderate glenohumeral joint space narrowing with  arthritic change.  There is a slightly higher pain indicative of rotator cuff arthropathy.  Mild AC joint arthropathy.  No acute fracture or bony abnormality noted   COGNITION: Overall cognitive status: Within functional limits for tasks assessed     SENSATION: WFL  POSTURE: Rounded shoulders   UPPER EXTREMITY ROM:   Active ROM Right eval Left eval  Shoulder flexion 115 w/pain 160 w/pain  Shoulder extension    Shoulder abduction 80 w/pain [facility]  Shoulder adduction    Shoulder internal rotation 55 w/pain [facility]  Shoulder external rotation 50 w/pain WFL  Elbow flexion    Elbow extension    Wrist flexion    Wrist extension    Wrist ulnar deviation    Wrist radial deviation    Wrist pronation    Wrist supination    (Blank rows = not tested)  UPPER EXTREMITY MMT:  MMT Right eval Left eval  Shoulder flexion 2- 3-  Shoulder extension    Shoulder abduction 2- 3-  Shoulder adduction    Shoulder internal rotation 3+ 3+  Shoulder external rotation 3+ 3+   Middle trapezius    Lower trapezius    Elbow flexion    Elbow extension    Wrist flexion    Wrist extension    Wrist ulnar deviation    Wrist radial deviation    Wrist pronation    Wrist supination    Grip strength (lbs)    (Blank rows = not tested)  SHOULDER SPECIAL TESTS: Impingement tests: Neer impingement test: positive , Hawkins/Kennedy impingement test: positive , and Painful arc test: positive  Rotator cuff assessment: Empty can test: positive   JOINT MOBILITY TESTING:  Increase  muscle guarding and pain with PROM   PALPATION:  Some TTP  TODAY'S TREATMENT:                                                                                                                                         DATE:  07/13/23 UBE L1 x3 mins each way  Wall slides 10x flexion and abd  Rows and ext red band 2x10 Standing AAROM flexion with 2# WaTE 2x10 Bicep curls with 2# WaTE 2x10 PROM in all directions and pec stretching  Ice pack to R shoulder for    EVAL 06/28/23   PATIENT EDUCATION: Education details: POC and HEP Person educated: Patient Education method: Explanation Education comprehension: verbalized understanding  HOME EXERCISE PROGRAM: Access Code: [facility] URL: [email or web address] Date: 06/28/2023 Prepared by: Cassie Freer  Exercises - Standing Shoulder Abduction AAROM with Dowel  - 1 x daily - 7 x weekly - 2 sets - 10 reps - Standing Shoulder Extension with Dowel  - 1 x daily - 7 x weekly - 2 sets - 10 reps - Standing Bilateral Shoulder Internal Rotation AAROM with Dowel  - 1 x daily - 7 x weekly - 2 sets - 10 reps - Standing Shoulder Flexion AAROM with Dowel  - 1 x daily - 7 x weekly - 2 sets - 10 reps - Shoulder Internal Rotation with Resistance  - 1 x daily - 7 x weekly - 2 sets - 10 reps - Shoulder External Rotation with Anchored Resistance  - 1 x daily - 7 x weekly - 2 sets - 10 reps  ASSESSMENT:  CLINICAL IMPRESSION: Patient is a 82 y.o. female who was seen today for physical therapy treatment for chronic shoulder pain and arthritis. She reports higher pain levels today and soreness. She thinks it may be from sleeping on the R shoulder all night. Today we worked on ROM and some light strengthening. Reports some pain with PROM. Opted to do some ice at end of visit to help with pain and possible soreness. She will benefit from skilled PT to address her deficits to be able to complete her ADLs with ease.   OBJECTIVE IMPAIRMENTS:  decreased mobility, decreased ROM, decreased strength, impaired UE functional use, and pain.   ACTIVITY LIMITATIONS: carrying, lifting, dressing, reach over head, and hygiene/grooming  PARTICIPATION LIMITATIONS: cleaning, laundry, shopping, community activity, and yard work  [facility] POTENTIAL: Good  CLINICAL DECISION MAKING: Stable/uncomplicated  EVALUATION COMPLEXITY: Low   GOALS: Goals reviewed with patient? Yes  SHORT TERM GOALS: Target date: 08/09/23  Patient will be independent with initial HEP.  Baseline: given 06/28/23  Goal status: INITIAL   LONG TERM GOALS: Target date: 09/20/23  Patient will be independent with advanced/ongoing HEP to improve outcomes and carryover.  Baseline:  Goal status: INITIAL  2.  Patient will report 75% improvement in R shoulder pain to improve QOL.  Baseline: 7/10 Goal status: INITIAL  3.  Patient to improve R shoulder AROM to [facility]  without pain provocation to allow for increased ease of ADLs.  Baseline: see chart Goal status: INITIAL  4.  Patient will demonstrate improved functional UE strength as demonstrated by 4/5 or better. Baseline: see chart Goal status: INITIAL  5.  Patient will be able to put a shirt on over her head.  Baseline: stopped trying Goal status: INITIAL    PLAN:  PT FREQUENCY: 2x/week  PT DURATION: 12 weeks  PLANNED INTERVENTIONS: 97110-Therapeutic exercises, 97530- Therapeutic activity, 97112- Neuromuscular re-education, 97535- Self Care, [id number]- Manual therapy, Patient/Family education, Taping, Dry Needling, Joint mobilization, Spinal mobilization, Cryotherapy, and Moist heat  PLAN FOR NEXT SESSION: shoulder AAROM, PROM, light strengthening as tolerated    Cassie Freer, PT 07/13/2023, 2:00 PM

## 2023-07-13 ENCOUNTER — Ambulatory Visit: Payer: Medicare PPO | Attending: Sports Medicine

## 2023-07-13 DIAGNOSIS — M25512 Pain in left shoulder: Secondary | ICD-10-CM | POA: Diagnosis not present

## 2023-07-13 DIAGNOSIS — G8929 Other chronic pain: Secondary | ICD-10-CM | POA: Diagnosis not present

## 2023-07-13 DIAGNOSIS — M25511 Pain in right shoulder: Secondary | ICD-10-CM | POA: Insufficient documentation

## 2023-07-13 DIAGNOSIS — M6281 Muscle weakness (generalized): Secondary | ICD-10-CM | POA: Diagnosis not present

## 2023-07-13 DIAGNOSIS — M25611 Stiffness of right shoulder, not elsewhere classified: Secondary | ICD-10-CM | POA: Diagnosis not present

## 2023-07-15 ENCOUNTER — Ambulatory Visit: Payer: Medicare PPO | Admitting: Physical Therapy

## 2023-07-18 ENCOUNTER — Ambulatory Visit: Payer: Medicare PPO | Admitting: Sports Medicine

## 2023-07-19 ENCOUNTER — Ambulatory Visit: Payer: Medicare PPO | Admitting: Physical Therapy

## 2023-07-21 ENCOUNTER — Ambulatory Visit: Payer: Medicare PPO

## 2023-07-21 DIAGNOSIS — M25611 Stiffness of right shoulder, not elsewhere classified: Secondary | ICD-10-CM

## 2023-07-21 DIAGNOSIS — M6281 Muscle weakness (generalized): Secondary | ICD-10-CM | POA: Diagnosis not present

## 2023-07-21 DIAGNOSIS — G8929 Other chronic pain: Secondary | ICD-10-CM | POA: Diagnosis not present

## 2023-07-21 DIAGNOSIS — M25512 Pain in left shoulder: Secondary | ICD-10-CM | POA: Diagnosis not present

## 2023-07-21 DIAGNOSIS — M25511 Pain in right shoulder: Secondary | ICD-10-CM | POA: Diagnosis not present

## 2023-07-21 NOTE — Therapy (Signed)
OUTPATIENT PHYSICAL THERAPY SHOULDER TREATMENT   Patient Name: Christy [name] MRN: [id number] DOB:10-18-1940, 82 y.o., female Today's Date: 07/21/2023  END OF SESSION:  PT End of Session - 07/21/23 1309     Visit Number 3    Date for PT Re-Evaluation 09/20/23    Authorization Type [employer]    Authorization - Number of Visits 24    PT Start Time 1310    PT Stop Time 1355    PT Time Calculation (min) 45 min    Activity Tolerance Patient tolerated treatment well;Patient limited by pain    Behavior During Therapy [facility] for tasks assessed/performed               Past Medical History:  Diagnosis Date   Agatston coronary artery calcium score greater than 400    Ca score 1121 in July 2022   Allergy    Arthritis    Chronic constipation    Depression    Diabetes mellitus without complication (HCC)    History of breast cancer    HLD (hyperlipidemia)    PAT (paroxysmal atrial tachycardia) (HCC)    Ziopatch 02/2021   Pulmonary insufficiency    moderate by echo 11/23   Rheumatoid arthritis (HCC)    Past Surgical History:  Procedure Laterality Date   ABDOMINAL HYSTERECTOMY     ACHILLES TENDON REPAIR     BREAST REDUCTION SURGERY Left    CATARACT EXTRACTION     masectomy Right    TONSILLECTOMY AND ADENOIDECTOMY     Patient Active Problem List   Diagnosis Date Noted   Lip swelling 09/28/2022   Cognitive decline 09/02/2022   Insomnia 09/02/2022   Hearing loss 09/02/2022   Acute pain of left knee 05/10/2022   Rheumatoid arthritis of right foot (HCC) 03/31/2022   Type 2 diabetes mellitus with diabetic neuropathy, without long-term current use of insulin (HCC) 03/31/2022   Anxiety and depression 03/31/2022   Diarrhea of presumed infectious origin 03/31/2022   History of breast cancer 03/31/2022   Pulmonary insufficiency 05/13/2021   PAT (paroxysmal atrial tachycardia) (HCC) 04/14/2021   Agatston coronary artery calcium score greater than 400 04/14/2021   HLD  (hyperlipidemia) 04/14/2021    PCP: Carilyn Goodpasture  REFERRING PROVIDER: Madelyn Brunner  REFERRING DIAG: chronic bilateral shoulder pain, right shoulder arthritis   THERAPY DIAG:  Chronic right shoulder pain  Stiffness of right shoulder, not elsewhere classified  Muscle weakness (generalized)  Chronic left shoulder pain  Rationale for Evaluation and Treatment: Rehabilitation  ONSET DATE: "six months"  SUBJECTIVE:  SUBJECTIVE STATEMENT: Shoulder not feeling good, I think I slept on it again.   Hand dominance: Right  PERTINENT HISTORY: Christy [name] is a pleasant 82 y.o. female who presents today for bilateral, right greater than left shoulder pain. Right shoulder is the one that has been giving her issues for many months, left 1 only bothers her very intermittently and is not as bothersome.  She did receive decent relief from subacromial joint injection but only lasted for a week or 2 and then her pain returned.  She would like to start formalized physical therapy.  She is on methotrexate 2.5 mg daily for her rheumatoid arthritis.  Has used Tylenol in the past but no consistent pain medication.   Pertinent ROS were reviewed with the patient and found to be negative unless otherwise specified above in HPI.   PAIN:  Are you having pain? Yes: NPRS scale: 6/10 Pain location: R shoulder and into hand  Pain description: sharp pain especially if I move the wrong way, worse in the mornings  Aggravating factors: reaching overhead, cooking sometimes especially if having to reach in the cupboards Relieving factors: ice packs  PRECAUTIONS: None  RED FLAGS: None   WEIGHT BEARING RESTRICTIONS: No  FALLS:  Has patient fallen in last 6 months? No  LIVING ENVIRONMENT: Lives with: lives with their family Lives  in: House/apartment Stairs: No   PLOF: Independent and Independent with basic ADLs  PATIENT GOALS:to be more normal and have normal movements, not to be concerned with movements hurting  NEXT MD VISIT:   OBJECTIVE:  Note: Objective measures were completed at Evaluation unless otherwise noted.  DIAGNOSTIC FINDINGS:  X-rays show mild to moderate glenohumeral joint space narrowing with  arthritic change.  There is a slightly higher pain indicative of rotator cuff arthropathy.  Mild AC joint arthropathy.  No acute fracture or bony abnormality noted   COGNITION: Overall cognitive status: Within functional limits for tasks assessed     SENSATION: WFL  POSTURE: Rounded shoulders   UPPER EXTREMITY ROM:   Active ROM Right eval Left eval  Shoulder flexion 115 w/pain 160 w/pain  Shoulder extension    Shoulder abduction 80 w/pain [facility]  Shoulder adduction    Shoulder internal rotation 55 w/pain [facility]  Shoulder external rotation 50 w/pain WFL  Elbow flexion    Elbow extension    Wrist flexion    Wrist extension    Wrist ulnar deviation    Wrist radial deviation    Wrist pronation    Wrist supination    (Blank rows = not tested)  UPPER EXTREMITY MMT:  MMT Right eval Left eval  Shoulder flexion 2- 3-  Shoulder extension    Shoulder abduction 2- 3-  Shoulder adduction    Shoulder internal rotation 3+ 3+  Shoulder external rotation 3+ 3+   Middle trapezius    Lower trapezius    Elbow flexion    Elbow extension    Wrist flexion    Wrist extension    Wrist ulnar deviation    Wrist radial deviation    Wrist pronation    Wrist supination    Grip strength (lbs)    (Blank rows = not tested)  SHOULDER SPECIAL TESTS: Impingement tests: Neer impingement test: positive , Hawkins/Kennedy impingement test: positive , and Painful arc test: positive  Rotator cuff assessment: Empty can test: positive   JOINT MOBILITY TESTING:  Increase muscle guarding and pain with PROM    PALPATION:  Some TTP  TODAY'S TREATMENT:                                                                                                                                         DATE:  07/21/23 UBE L1 x31mins each way  Pec stretch in doorway Yellow band ER/IR 2x10 1# bicep curl 2x10 1# shoulder flexion and abduction 2x10 PROM in all directions MET to increase shoulder IR  Supine flexion AAROM with dowel x10 Supine abduction AAROM with dowel x10  Sidelying ER 1# 2x10   07/13/23 UBE L1 x3 mins each way  Wall slides 10x flexion and abd  Rows and ext red band 2x10 Standing AAROM flexion with 2# WaTE 2x10 Bicep curls with 2# WaTE 2x10 PROM in all directions and pec stretching  Ice pack to R shoulder for    EVAL 06/28/23   PATIENT EDUCATION: Education details: POC and HEP Person educated: Patient Education method: Explanation Education comprehension: verbalized understanding  HOME EXERCISE PROGRAM: Access Code: [facility] URL: [email or web address] Date: 06/28/2023 Prepared by: Cassie Freer  Exercises - Standing Shoulder Abduction AAROM with Dowel  - 1 x daily - 7 x weekly - 2 sets - 10 reps - Standing Shoulder Extension with Dowel  - 1 x daily - 7 x weekly - 2 sets - 10 reps - Standing Bilateral Shoulder Internal Rotation AAROM with Dowel  - 1 x daily - 7 x weekly - 2 sets - 10 reps - Standing Shoulder Flexion AAROM with Dowel  - 1 x daily - 7 x weekly - 2 sets - 10 reps - Shoulder Internal Rotation with Resistance  - 1 x daily - 7 x weekly - 2 sets - 10 reps - Shoulder External Rotation with Anchored Resistance  - 1 x daily - 7 x weekly - 2 sets - 10 reps  ASSESSMENT:  CLINICAL IMPRESSION: Patient is a 82 y.o. female who was seen today for physical therapy treatment for chronic shoulder pain and arthritis. She reports higher pain levels today and soreness. Today we worked on ROM and some light strengthening. She has difficulty and muscle fatigue  with small weight, only able to go partial ranges. Reports some pain with PROM, very limited with abduction and IR.  She will benefit from skilled PT to address her deficits to be able to complete her ADLs with ease.   OBJECTIVE IMPAIRMENTS: decreased mobility, decreased ROM, decreased strength, impaired UE functional use, and pain.   ACTIVITY LIMITATIONS: carrying, lifting, dressing, reach over head, and hygiene/grooming  PARTICIPATION LIMITATIONS: cleaning, laundry, shopping, community activity, and yard work  [facility] POTENTIAL: Good  CLINICAL DECISION MAKING: Stable/uncomplicated  EVALUATION COMPLEXITY: Low   GOALS: Goals reviewed with patient? Yes  SHORT TERM GOALS: Target date: 08/09/23  Patient will be independent with initial HEP.  Baseline: given 06/28/23  Goal status: INITIAL   LONG TERM GOALS: Target date: 09/20/23  Patient will be  independent with advanced/ongoing HEP to improve outcomes and carryover.  Baseline:  Goal status: INITIAL  2.  Patient will report 75% improvement in R shoulder pain to improve QOL.  Baseline: 7/10 Goal status: INITIAL  3.  Patient to improve R shoulder AROM to [facility] without pain provocation to allow for increased ease of ADLs.  Baseline: see chart Goal status: INITIAL  4.  Patient will demonstrate improved functional UE strength as demonstrated by 4/5 or better. Baseline: see chart Goal status: INITIAL  5.  Patient will be able to put a shirt on over her head.  Baseline: stopped trying Goal status: INITIAL    PLAN:  PT FREQUENCY: 2x/week  PT DURATION: 12 weeks  PLANNED INTERVENTIONS: 97110-Therapeutic exercises, 97530- Therapeutic activity, 97112- Neuromuscular re-education, 97535- Self Care, 82956- Manual therapy, Patient/Family education, Taping, Dry Needling, Joint mobilization, Spinal mobilization, Cryotherapy, and Moist heat  PLAN FOR NEXT SESSION: shoulder AAROM, PROM, light strengthening as tolerated    Cassie Freer,  PT 07/21/2023, 1:50 PM

## 2023-07-22 DIAGNOSIS — E1140 Type 2 diabetes mellitus with diabetic neuropathy, unspecified: Secondary | ICD-10-CM | POA: Diagnosis not present

## 2023-07-22 DIAGNOSIS — I700 Atherosclerosis of aorta: Secondary | ICD-10-CM | POA: Diagnosis not present

## 2023-07-22 DIAGNOSIS — F418 Other specified anxiety disorders: Secondary | ICD-10-CM | POA: Diagnosis not present

## 2023-07-25 DIAGNOSIS — M7989 Other specified soft tissue disorders: Secondary | ICD-10-CM | POA: Diagnosis not present

## 2023-07-25 DIAGNOSIS — E663 Overweight: Secondary | ICD-10-CM | POA: Diagnosis not present

## 2023-07-25 DIAGNOSIS — E559 Vitamin D deficiency, unspecified: Secondary | ICD-10-CM | POA: Diagnosis not present

## 2023-07-25 DIAGNOSIS — M0579 Rheumatoid arthritis with rheumatoid factor of multiple sites without organ or systems involvement: Secondary | ICD-10-CM | POA: Diagnosis not present

## 2023-07-25 DIAGNOSIS — R5383 Other fatigue: Secondary | ICD-10-CM | POA: Diagnosis not present

## 2023-07-25 DIAGNOSIS — M25511 Pain in right shoulder: Secondary | ICD-10-CM | POA: Diagnosis not present

## 2023-07-25 DIAGNOSIS — M1991 Primary osteoarthritis, unspecified site: Secondary | ICD-10-CM | POA: Diagnosis not present

## 2023-07-25 DIAGNOSIS — Z6826 Body mass index (BMI) 26.0-26.9, adult: Secondary | ICD-10-CM | POA: Diagnosis not present

## 2023-07-28 ENCOUNTER — Ambulatory Visit: Payer: Medicare PPO | Admitting: Physical Therapy

## 2023-08-01 DIAGNOSIS — M25562 Pain in left knee: Secondary | ICD-10-CM | POA: Diagnosis not present

## 2023-08-04 ENCOUNTER — Other Ambulatory Visit (HOSPITAL_COMMUNITY): Payer: Self-pay

## 2023-08-04 NOTE — Telephone Encounter (Signed)
ERROR

## 2023-08-08 ENCOUNTER — Ambulatory Visit: Payer: Medicare PPO | Admitting: Sports Medicine

## 2023-08-16 ENCOUNTER — Ambulatory Visit: Payer: Medicare PPO | Admitting: Physical Therapy

## 2023-08-18 ENCOUNTER — Ambulatory Visit: Payer: Medicare PPO | Attending: Sports Medicine | Admitting: Physical Therapy

## 2023-08-18 DIAGNOSIS — M6281 Muscle weakness (generalized): Secondary | ICD-10-CM | POA: Diagnosis not present

## 2023-08-18 DIAGNOSIS — M25511 Pain in right shoulder: Secondary | ICD-10-CM | POA: Diagnosis not present

## 2023-08-18 DIAGNOSIS — M25512 Pain in left shoulder: Secondary | ICD-10-CM | POA: Diagnosis not present

## 2023-08-18 DIAGNOSIS — M25611 Stiffness of right shoulder, not elsewhere classified: Secondary | ICD-10-CM | POA: Diagnosis not present

## 2023-08-18 DIAGNOSIS — G8929 Other chronic pain: Secondary | ICD-10-CM | POA: Diagnosis not present

## 2023-08-18 NOTE — Therapy (Signed)
 OUTPATIENT PHYSICAL THERAPY SHOULDER TREATMENT   Patient Name: Christy [name] MRN: [id number] DOB:[birth year], 83 y.o., female Today's Date: 08/18/2023  END OF SESSION:  PT End of Session - 08/18/23 1427     Visit Number 4    Date for PT Re-Evaluation 09/20/23    Authorization Type [employer]    Authorization - Number of Visits 24    PT Start Time 1430    PT Stop Time 1510    PT Time Calculation (min) 40 min    Activity Tolerance Patient tolerated treatment well;Patient limited by pain    Behavior During Therapy [facility] for tasks assessed/performed             Past Medical History:  Diagnosis Date   Agatston coronary artery calcium  score greater than 400    Ca score 1121 in July 2022   Allergy    Arthritis    Chronic constipation    Depression    Diabetes mellitus without complication (HCC)    History of breast cancer    HLD (hyperlipidemia)    PAT (paroxysmal atrial tachycardia) (HCC)    Ziopatch 02/2021   Pulmonary insufficiency    moderate by echo 11/23   Rheumatoid arthritis (HCC)    Past Surgical History:  Procedure Laterality Date   ABDOMINAL HYSTERECTOMY     ACHILLES TENDON REPAIR     BREAST REDUCTION SURGERY Left    CATARACT EXTRACTION     masectomy Right    TONSILLECTOMY AND ADENOIDECTOMY     Patient Active Problem List   Diagnosis Date Noted   Lip swelling 09/28/2022   Cognitive decline 09/02/2022   Insomnia 09/02/2022   Hearing loss 09/02/2022   Acute pain of left knee 05/10/2022   Rheumatoid arthritis of right foot (HCC) 03/31/2022   Type 2 diabetes mellitus with diabetic neuropathy, without long-term current use of insulin  (HCC) 03/31/2022   Anxiety and depression 03/31/2022   Diarrhea of presumed infectious origin 03/31/2022   History of breast cancer 03/31/2022   Pulmonary insufficiency 05/13/2021   PAT (paroxysmal atrial tachycardia) (HCC) 04/14/2021   Agatston coronary artery calcium  score greater than 400 04/14/2021   HLD (hyperlipidemia)  04/14/2021    PCP: Christy [name]  REFERRING PROVIDER: Lonell [name]  REFERRING DIAG: chronic bilateral shoulder pain, right shoulder arthritis   THERAPY DIAG:  Chronic right shoulder pain  Stiffness of right shoulder, not elsewhere classified  Muscle weakness (generalized)  Chronic left shoulder pain  Rationale for Evaluation and Treatment: Rehabilitation  ONSET DATE: six months  SUBJECTIVE:  SUBJECTIVE STATEMENT: Pt states she is having a lot of arthritis pain today especially in the right arm.   Hand dominance: Right  PERTINENT HISTORY: Christy [name] is a pleasant 83 y.o. female who presents today for bilateral, right greater than left shoulder pain. Right shoulder is the one that has been giving her issues for many months, left 1 only bothers her very intermittently and is not as bothersome.  She did receive decent relief from subacromial joint injection but only lasted for a week or 2 and then her pain returned.  She would like to start formalized physical therapy.  She is on methotrexate 2.5 mg daily for her rheumatoid arthritis.  Has used Tylenol  in the past but no consistent pain medication.   Pertinent ROS were reviewed with the patient and found to be negative unless otherwise specified above in HPI.   PAIN:  Are you having pain? Yes: NPRS scale: 8/10 Pain location: R shoulder and into hand  Pain description: sharp pain especially if I move the wrong way, worse in the mornings  Aggravating factors: reaching overhead, cooking sometimes especially if having to reach in the cupboards Relieving factors: ice packs  PRECAUTIONS: None  RED FLAGS: None   WEIGHT BEARING RESTRICTIONS: No  FALLS:  Has patient fallen in last 6 months? No  LIVING ENVIRONMENT: Lives with: lives with their  family Lives in: House/apartment Stairs: No   PLOF: Independent and Independent with basic ADLs  PATIENT GOALS:to be more normal and have normal movements, not to be concerned with movements hurting  NEXT MD VISIT:   OBJECTIVE:  Note: Objective measures were completed at Evaluation unless otherwise noted.  DIAGNOSTIC FINDINGS:  X-rays show mild to moderate glenohumeral joint space narrowing with  arthritic change.  There is a slightly higher pain indicative of rotator cuff arthropathy.  Mild AC joint arthropathy.  No acute fracture or bony abnormality noted   COGNITION: Overall cognitive status: Within functional limits for tasks assessed     SENSATION: WFL  POSTURE: Rounded shoulders   UPPER EXTREMITY ROM:   Active ROM Right eval Left eval  Shoulder flexion 115 w/pain 160 w/pain  Shoulder extension    Shoulder abduction 80 w/pain [facility]  Shoulder adduction    Shoulder internal rotation 55 w/pain [facility]  Shoulder external rotation 50 w/pain WFL  Elbow flexion    Elbow extension    Wrist flexion    Wrist extension    Wrist ulnar deviation    Wrist radial deviation    Wrist pronation    Wrist supination    (Blank rows = not tested)  UPPER EXTREMITY MMT:  MMT Right eval Left eval  Shoulder flexion 2- 3-  Shoulder extension    Shoulder abduction 2- 3-  Shoulder adduction    Shoulder internal rotation 3+ 3+  Shoulder external rotation 3+ 3+   Middle trapezius    Lower trapezius    Elbow flexion    Elbow extension    Wrist flexion    Wrist extension    Wrist ulnar deviation    Wrist radial deviation    Wrist pronation    Wrist supination    Grip strength (lbs)    (Blank rows = not tested)  SHOULDER SPECIAL TESTS: Impingement tests: Neer impingement test: positive , Hawkins/Kennedy impingement test: positive , and Painful arc test: positive  Rotator cuff assessment: Empty can test: positive   JOINT MOBILITY TESTING:  Increase muscle guarding and pain  with PROM   PALPATION:  Some TTP    TODAY'S TREATMENT:                                                                                                                                         DATE:  08/18/23 Attempted UBE but pt found it too painful today Pball roll out forward and side to side x10 each Pball roll CW & CCW x10 Pendulums fwd/bwd, side to side x10 Bicep stretch against doorway x30, attempted pec stretch but too much for pt Scap squeeze + pec overpressure for stretching x20 Scapular depression against chair x20 Performed sitting ER AROM but pt reporting increased pain after performing x10 Cervical retraction x20 UT stretch x30 R&L Standing ER red TB 2x10 Standing IR red TB 2x10 Thoracic extension against chair x10 Seated Row red TB x10 Scap retraction x10 Manual therapy:  Scapular mobilization Manual pec stretch Posterior joint mob grade II to III Inferior joint mob grade II to III  07/21/23 UBE L1 x69mins each way  Pec stretch in doorway Yellow band ER/IR 2x10 1# bicep curl 2x10 1# shoulder flexion and abduction 2x10 PROM in all directions MET to increase shoulder IR  Supine flexion AAROM with dowel x10 Supine abduction AAROM with dowel x10  Sidelying ER 1# 2x10   07/13/23 UBE L1 x3 mins each way  Wall slides 10x flexion and abd  Rows and ext red band 2x10 Standing AAROM flexion with 2# WaTE 2x10 Bicep curls with 2# WaTE 2x10 PROM in all directions and pec stretching  Ice pack to R shoulder for    EVAL 06/28/23   PATIENT EDUCATION: Education details: POC and HEP Person educated: Patient Education method: Explanation Education comprehension: verbalized understanding  HOME EXERCISE PROGRAM: Access Code: [facility] URL: [email or web address] Date: 08/18/2023 Prepared by: Steven Veazie April Earnie Starring  Exercises - Standing Shoulder Extension with Dowel  - 1 x daily - 7 x weekly - 2 sets - 10 reps - Standing Bilateral Shoulder  Internal Rotation AAROM with Dowel  - 1 x daily - 7 x weekly - 2 sets - 10 reps - Shoulder Internal Rotation with Resistance  - 1 x daily - 7 x weekly - 2 sets - 10 reps - Shoulder External Rotation with Anchored Resistance  - 1 x daily - 7 x weekly - 2 sets - 10 reps - Doorway Pec Stretch at 60 Degrees Abduction with Arm Straight  - 1 x daily - 7 x weekly - 2 sets - 30 sec hold - Seated Bilateral Shoulder Flexion Towel Slide at Table Top  - 1 x daily - 7 x weekly - 2 sets - 10 reps - Seated Scapular Retraction  - 1 x daily - 7 x weekly - 2 sets - 10 reps - Seated Thoracic Extension AROM  - 1 x daily - 7 x weekly - 1 sets - 10 reps  ASSESSMENT:  CLINICAL IMPRESSION: Patient  is a 83 y.o. female who was seen today for physical therapy treatment for chronic shoulder pain and arthritis. Pt with very stiff thoracic and cervical spine -- worked on gentle thoracic and scapular mobilizations today. Pt with poor scapular control; worked on scapular squeezes to reduce her rounded shoulders. Pt with tendency to utilize UTs vs midback muscles. Humeral head is very anteriorly rotated, provided pec stretching and manual work to try and decrease this with pt noting this felt good. Will continue to try and strengthen pt's midback/shoulders as tolerated.   OBJECTIVE IMPAIRMENTS: decreased mobility, decreased ROM, decreased strength, impaired UE functional use, and pain.   ACTIVITY LIMITATIONS: carrying, lifting, dressing, reach over head, and hygiene/grooming  PARTICIPATION LIMITATIONS: cleaning, laundry, shopping, community activity, and yard work  [facility] POTENTIAL: Good  CLINICAL DECISION MAKING: Stable/uncomplicated  EVALUATION COMPLEXITY: Low   GOALS: Goals reviewed with patient? Yes  SHORT TERM GOALS: Target date: 08/09/23  Patient will be independent with initial HEP.  Baseline: given 06/28/23  Goal status: INITIAL   LONG TERM GOALS: Target date: 09/20/23  Patient will be independent with  advanced/ongoing HEP to improve outcomes and carryover.  Baseline:  Goal status: INITIAL  2.  Patient will report 75% improvement in R shoulder pain to improve QOL.  Baseline: 7/10 Goal status: INITIAL  3.  Patient to improve R shoulder AROM to [facility] without pain provocation to allow for increased ease of ADLs.  Baseline: see chart Goal status: INITIAL  4.  Patient will demonstrate improved functional UE strength as demonstrated by 4/5 or better. Baseline: see chart Goal status: INITIAL  5.  Patient will be able to put a shirt on over her head.  Baseline: stopped trying Goal status: INITIAL    PLAN:  PT FREQUENCY: 2x/week  PT DURATION: 12 weeks  PLANNED INTERVENTIONS: 97110-Therapeutic exercises, 97530- Therapeutic activity, 97112- Neuromuscular re-education, 97535- Self Care, [id number]- Manual therapy, Patient/Family education, Taping, Dry Needling, Joint mobilization, Spinal mobilization, Cryotherapy, and Moist heat  PLAN FOR NEXT SESSION: shoulder AAROM, PROM, light strengthening as tolerated    Charlei Ramsaran April Ma L Amer Alcindor, PT 08/18/2023, 2:27 PM

## 2023-08-23 ENCOUNTER — Ambulatory Visit: Payer: Medicare PPO | Admitting: Physical Therapy

## 2023-08-23 DIAGNOSIS — M6281 Muscle weakness (generalized): Secondary | ICD-10-CM

## 2023-08-23 DIAGNOSIS — M25611 Stiffness of right shoulder, not elsewhere classified: Secondary | ICD-10-CM

## 2023-08-23 DIAGNOSIS — M25512 Pain in left shoulder: Secondary | ICD-10-CM | POA: Diagnosis not present

## 2023-08-23 DIAGNOSIS — G8929 Other chronic pain: Secondary | ICD-10-CM

## 2023-08-23 DIAGNOSIS — M25511 Pain in right shoulder: Secondary | ICD-10-CM | POA: Diagnosis not present

## 2023-08-23 NOTE — Therapy (Signed)
 OUTPATIENT PHYSICAL THERAPY SHOULDER TREATMENT   Patient Name: Christy [name] MRN: [id number] DOB:10/11/1940, 83 y.o., female Today's Date: 08/23/2023  END OF SESSION:  PT End of Session - 08/23/23 1430     Visit Number 5    Date for PT Re-Evaluation 09/20/23    Authorization Type [employer]    Authorization - Number of Visits 24    PT Start Time 1430    PT Stop Time 1510    PT Time Calculation (min) 40 min    Activity Tolerance Patient tolerated treatment well;Patient limited by pain    Behavior During Therapy [facility] for tasks assessed/performed              Past Medical History:  Diagnosis Date   Agatston coronary artery calcium  score greater than 400    Ca score 1121 in July 2022   Allergy    Arthritis    Chronic constipation    Depression    Diabetes mellitus without complication (HCC)    History of breast cancer    HLD (hyperlipidemia)    PAT (paroxysmal atrial tachycardia) (HCC)    Ziopatch 02/2021   Pulmonary insufficiency    moderate by echo 11/23   Rheumatoid arthritis (HCC)    Past Surgical History:  Procedure Laterality Date   ABDOMINAL HYSTERECTOMY     ACHILLES TENDON REPAIR     BREAST REDUCTION SURGERY Left    CATARACT EXTRACTION     masectomy Right    TONSILLECTOMY AND ADENOIDECTOMY     Patient Active Problem List   Diagnosis Date Noted   Lip swelling 09/28/2022   Cognitive decline 09/02/2022   Insomnia 09/02/2022   Hearing loss 09/02/2022   Acute pain of left knee 05/10/2022   Rheumatoid arthritis of right foot (HCC) 03/31/2022   Type 2 diabetes mellitus with diabetic neuropathy, without long-term current use of insulin  (HCC) 03/31/2022   Anxiety and depression 03/31/2022   Diarrhea of presumed infectious origin 03/31/2022   History of breast cancer 03/31/2022   Pulmonary insufficiency 05/13/2021   PAT (paroxysmal atrial tachycardia) (HCC) 04/14/2021   Agatston coronary artery calcium  score greater than 400 04/14/2021   HLD (hyperlipidemia)  04/14/2021    PCP: Christy [name]  REFERRING PROVIDER: Lonell [name]  REFERRING DIAG: chronic bilateral shoulder pain, right shoulder arthritis   THERAPY DIAG:  Chronic right shoulder pain  Stiffness of right shoulder, not elsewhere classified  Muscle weakness (generalized)  Chronic left shoulder pain  Rationale for Evaluation and Treatment: Rehabilitation  ONSET DATE: six months  SUBJECTIVE:  SUBJECTIVE STATEMENT: Today is the best day I've had in a while. Reports some posterior shoulder soreness after last session but it went away after a day. States it seems to be working.  Hand dominance: Right  PERTINENT HISTORY: Christy [name] is a pleasant 83 y.o. female who presents today for bilateral, right greater than left shoulder pain. Right shoulder is the one that has been giving her issues for many months, left 1 only bothers her very intermittently and is not as bothersome.  She did receive decent relief from subacromial joint injection but only lasted for a week or 2 and then her pain returned.  She would like to start formalized physical therapy.  She is on methotrexate 2.5 mg daily for her rheumatoid arthritis.  Has used Tylenol  in the past but no consistent pain medication.   Pertinent ROS were reviewed with the patient and found to be negative unless otherwise specified above in HPI.   PAIN:  Are you having pain? Yes: NPRS scale: 4-5/10 Pain location: R shoulder and into hand  Pain description: sharp pain especially if I move the wrong way, worse in the mornings  Aggravating factors: reaching overhead, cooking sometimes especially if having to reach in the cupboards Relieving factors: ice packs  PRECAUTIONS: None  RED FLAGS: None   WEIGHT BEARING RESTRICTIONS: No  FALLS:  Has patient  fallen in last 6 months? No  LIVING ENVIRONMENT: Lives with: lives with their family Lives in: House/apartment Stairs: No   PLOF: Independent and Independent with basic ADLs  PATIENT GOALS:to be more normal and have normal movements, not to be concerned with movements hurting  NEXT MD VISIT:   OBJECTIVE:  Note: Objective measures were completed at Evaluation unless otherwise noted.  DIAGNOSTIC FINDINGS:  X-rays show mild to moderate glenohumeral joint space narrowing with  arthritic change.  There is a slightly higher pain indicative of rotator cuff arthropathy.  Mild AC joint arthropathy.  No acute fracture or bony abnormality noted   COGNITION: Overall cognitive status: Within functional limits for tasks assessed     SENSATION: WFL  POSTURE: Rounded shoulders   UPPER EXTREMITY ROM:   Active ROM Right eval Left eval  Shoulder flexion 115 w/pain 160 w/pain  Shoulder extension    Shoulder abduction 80 w/pain [facility]  Shoulder adduction    Shoulder internal rotation 55 w/pain [facility]  Shoulder external rotation 50 w/pain [facility]  Elbow flexion    Elbow extension    Wrist flexion    Wrist extension    Wrist ulnar deviation    Wrist radial deviation    Wrist pronation    Wrist supination    (Blank rows = not tested)  UPPER EXTREMITY MMT:  MMT Right eval Left eval  Shoulder flexion 2- 3-  Shoulder extension    Shoulder abduction 2- 3-  Shoulder adduction    Shoulder internal rotation 3+ 3+  Shoulder external rotation 3+ 3+   Middle trapezius    Lower trapezius    Elbow flexion    Elbow extension    Wrist flexion    Wrist extension    Wrist ulnar deviation    Wrist radial deviation    Wrist pronation    Wrist supination    Grip strength (lbs)    (Blank rows = not tested)  SHOULDER SPECIAL TESTS: Impingement tests: Neer impingement test: positive , Hawkins/Kennedy impingement test: positive , and Painful arc test: positive  Rotator cuff assessment: Empty can  test: positive   JOINT  MOBILITY TESTING:  Increase muscle guarding and pain with PROM   PALPATION:  Some TTP    TODAY'S TREATMENT:                                                                                                                                         DATE:  08/23/23 Pball roll out forward x10 Pball CW and CCW roll x10 Sitting  Thoracic extension against ball 3x20  Scap squeeze with PT overpressure 2x10  Shoulder ext iso holds with PT pulling yellow TB x10  Shoulder ER and IR iso holds with PT pulling yellow TB 2x10  Row with PT pulling yellow TB 2x10  Shoulder ER with arm in scaption ~30 deg x10 AAROM Standing  Pendulums fwd/bwd, side to side x10 each  Wall pushup plus x10 Manual therapy:  Scapular mobilization Manual pec stretch  08/18/23 Attempted UBE but pt found it too painful today Pball roll out forward and side to side x10 each Pball roll CW & CCW x10 Pendulums fwd/bwd, side to side x10 Bicep stretch against doorway x30, attempted pec stretch but too much for pt Scap squeeze + pec overpressure for stretching x20 Scapular depression against chair x20 Performed sitting ER AROM but pt reporting increased pain after performing x10 Cervical retraction x20 UT stretch x30 R&L Standing ER red TB 2x10 Standing IR red TB 2x10 Thoracic extension against chair x10 Seated Row red TB x10 Scap retraction x10 Manual therapy:  Scapular mobilization Manual pec stretch Posterior joint mob grade II to III Inferior joint mob grade II to III  07/21/23 UBE L1 x38mins each way  Pec stretch in doorway Yellow band ER/IR 2x10 1# bicep curl 2x10 1# shoulder flexion and abduction 2x10 PROM in all directions MET to increase shoulder IR  Supine flexion AAROM with dowel x10 Supine abduction AAROM with dowel x10  Sidelying ER 1# 2x10   07/13/23 UBE L1 x3 mins each way  Wall slides 10x flexion and abd  Rows and ext red band 2x10 Standing AAROM flexion with 2# WaTE  2x10 Bicep curls with 2# WaTE 2x10 PROM in all directions and pec stretching  Ice pack to R shoulder for    EVAL 06/28/23   PATIENT EDUCATION: Education details: POC and HEP Person educated: Patient Education method: Explanation Education comprehension: verbalized understanding  HOME EXERCISE PROGRAM: Access Code: [facility] URL: [email or web address] Date: 08/18/2023 Prepared by: Pegeen Stiger April Earnie Starring  Exercises - Standing Shoulder Extension with Dowel  - 1 x daily - 7 x weekly - 2 sets - 10 reps - Standing Bilateral Shoulder Internal Rotation AAROM with Dowel  - 1 x daily - 7 x weekly - 2 sets - 10 reps - Shoulder Internal Rotation with Resistance  - 1 x daily - 7 x weekly - 2 sets - 10 reps - Shoulder External Rotation with Anchored Resistance  - 1 x daily -  7 x weekly - 2 sets - 10 reps - Doorway Pec Stretch at 60 Degrees Abduction with Arm Straight  - 1 x daily - 7 x weekly - 2 sets - 30 sec hold - Seated Bilateral Shoulder Flexion Towel Slide at Table Top  - 1 x daily - 7 x weekly - 2 sets - 10 reps - Seated Scapular Retraction  - 1 x daily - 7 x weekly - 2 sets - 10 reps - Seated Thoracic Extension AROM  - 1 x daily - 7 x weekly - 1 sets - 10 reps  ASSESSMENT:  CLINICAL IMPRESSION: Patient is a 83 y.o. female who was seen today for physical therapy treatment for chronic shoulder pain and arthritis. Pt is noting less pain in her shoulder today. Continued trying to improve scapular and rotator cuff strength. Improved ability to maintain proper scapular positioning for all shoulder exercises. Pt is getting some pain/pulling into her forearm even with pball rolls -- this seems to likely be a tight musculocutaneous or radial nerve. May benefit from nerve glides next session.   OBJECTIVE IMPAIRMENTS: decreased mobility, decreased ROM, decreased strength, impaired UE functional use, and pain.     GOALS: Goals reviewed with patient? Yes  SHORT TERM  GOALS: Target date: 08/09/23  Patient will be independent with initial HEP.  Baseline: given 06/28/23  Goal status: INITIAL   LONG TERM GOALS: Target date: 09/20/23  Patient will be independent with advanced/ongoing HEP to improve outcomes and carryover.  Baseline:  Goal status: INITIAL  2.  Patient will report 75% improvement in R shoulder pain to improve QOL.  Baseline: 7/10 Goal status: INITIAL  3.  Patient to improve R shoulder AROM to [facility] without pain provocation to allow for increased ease of ADLs.  Baseline: see chart Goal status: INITIAL  4.  Patient will demonstrate improved functional UE strength as demonstrated by 4/5 or better. Baseline: see chart Goal status: INITIAL  5.  Patient will be able to put a shirt on over her head.  Baseline: stopped trying Goal status: INITIAL    PLAN:  PT FREQUENCY: 2x/week  PT DURATION: 12 weeks  PLANNED INTERVENTIONS: 97110-Therapeutic exercises, 97530- Therapeutic activity, 97112- Neuromuscular re-education, 97535- Self Care, [id number]- Manual therapy, Patient/Family education, Taping, Dry Needling, Joint mobilization, Spinal mobilization, Cryotherapy, and Moist heat  PLAN FOR NEXT SESSION: shoulder AAROM, PROM, light strengthening as tolerated    Melisia Leming April Ma L Shenia Alan, PT 08/23/2023, 2:30 PM

## 2023-08-24 NOTE — Therapy (Signed)
OUTPATIENT PHYSICAL THERAPY SHOULDER TREATMENT   Patient Name: Christy [name] MRN: [id number] DOB:[birth year], 83 y.o., female Today's Date: 08/25/2023  END OF SESSION:     Past Medical History:  Diagnosis Date   Agatston coronary artery calcium score greater than 400    Ca score 1121 in July 2022   Allergy    Arthritis    Chronic constipation    Depression    Diabetes mellitus without complication (HCC)    History of breast cancer    HLD (hyperlipidemia)    PAT (paroxysmal atrial tachycardia) (HCC)    Ziopatch 02/2021   Pulmonary insufficiency    moderate by echo 11/23   Rheumatoid arthritis (HCC)    Past Surgical History:  Procedure Laterality Date   ABDOMINAL HYSTERECTOMY     ACHILLES TENDON REPAIR     BREAST REDUCTION SURGERY Left    CATARACT EXTRACTION     masectomy Right    TONSILLECTOMY AND ADENOIDECTOMY     Patient Active Problem List   Diagnosis Date Noted   Lip swelling 09/28/2022   Cognitive decline 09/02/2022   Insomnia 09/02/2022   Hearing loss 09/02/2022   Acute pain of left knee 05/10/2022   Rheumatoid arthritis of right foot (HCC) 03/31/2022   Type 2 diabetes mellitus with diabetic neuropathy, without long-term current use of insulin (HCC) 03/31/2022   Anxiety and depression 03/31/2022   Diarrhea of presumed infectious origin 03/31/2022   History of breast cancer 03/31/2022   Pulmonary insufficiency 05/13/2021   PAT (paroxysmal atrial tachycardia) (HCC) 04/14/2021   Agatston coronary artery calcium score greater than 400 04/14/2021   HLD (hyperlipidemia) 04/14/2021    PCP: Carilyn Goodpasture  REFERRING PROVIDER: Madelyn Brunner  REFERRING DIAG: chronic bilateral shoulder pain, right shoulder arthritis   THERAPY DIAG:  No diagnosis found.  Rationale for Evaluation and Treatment: Rehabilitation  ONSET DATE: "six months"  SUBJECTIVE:                                                                                                                                                                                       SUBJECTIVE STATEMENT: Shoulder feeling good today, but mentioned she was having some dizzy spells recently. Felt good going in and good going out after the last session. Feels as though her rheumatoid arthritis is having a flare up.   Hand dominance: Right  PERTINENT HISTORY: Christy [name] is a pleasant 83 y.o. female who presents today for bilateral, right greater than left shoulder pain. Right shoulder is the one that has been giving her issues for many months, left 1 only bothers her very intermittently and is not as bothersome. She did receive decent  relief from subacromial joint injection but only lasted for a week or 2 and then her pain returned. She would like to start formalized physical therapy. She is on methotrexate 2.5 mg daily for her rheumatoid arthritis. Has used Tylenol in the past but no consistent pain medication.   Pertinent ROS were reviewed with the patient and found to be negative unless otherwise specified above in HPI.   PAIN:  Are you having pain? Yes: NPRS scale: 4-5/10 Pain location: R shoulder and into hand  Pain description: sharp pain especially if I move the wrong way, worse in the mornings  Aggravating factors: reaching overhead, cooking sometimes especially if having to reach in the cupboards Relieving factors: ice packs  PRECAUTIONS: None  RED FLAGS: None   WEIGHT BEARING RESTRICTIONS: No  FALLS:  Has patient fallen in last 6 months? No  LIVING ENVIRONMENT: Lives with: lives with their family Lives in: House/apartment Stairs: No   PLOF: Independent and Independent with basic ADLs  PATIENT GOALS:to be more normal and have normal movements, not to be concerned with movements hurting  NEXT MD VISIT:   OBJECTIVE:  Note: Objective measures were completed at Evaluation unless otherwise noted.  DIAGNOSTIC FINDINGS:  X-rays show mild to moderate glenohumeral joint space narrowing  with  arthritic change.  There is a slightly higher pain indicative of rotator cuff arthropathy.  Mild AC joint arthropathy.  No acute fracture or bony abnormality noted   COGNITION: Overall cognitive status: Within functional limits for tasks assessed     SENSATION: WFL  POSTURE: Rounded shoulders   UPPER EXTREMITY ROM:   Active ROM Right eval Left eval  Shoulder flexion 115 w/pain 160 w/pain  Shoulder extension    Shoulder abduction 80 w/pain [facility]  Shoulder adduction    Shoulder internal rotation 55 w/pain [facility]  Shoulder external rotation 50 w/pain WFL  Elbow flexion    Elbow extension    Wrist flexion    Wrist extension    Wrist ulnar deviation    Wrist radial deviation    Wrist pronation    Wrist supination    (Blank rows = not tested)  UPPER EXTREMITY MMT:  MMT Right eval Left eval  Shoulder flexion 2- 3-  Shoulder extension    Shoulder abduction 2- 3-  Shoulder adduction    Shoulder internal rotation 3+ 3+  Shoulder external rotation 3+ 3+   Middle trapezius    Lower trapezius    Elbow flexion    Elbow extension    Wrist flexion    Wrist extension    Wrist ulnar deviation    Wrist radial deviation    Wrist pronation    Wrist supination    Grip strength (lbs)    (Blank rows = not tested)  SHOULDER SPECIAL TESTS: Impingement tests: Neer impingement test: positive , Hawkins/Kennedy impingement test: positive , and Painful arc test: positive  Rotator cuff assessment: Empty can test: positive   JOINT MOBILITY TESTING:  Increase muscle guarding and pain with PROM   PALPATION:  Some TTP    TODAY'S TREATMENT:  DATE:  08/25/23 Arm bike Level 1 for 6 min, only forwards (painful with backwards)  Wall slides, flexion 2x10 abduction 1x10  Row 2x10 yellow band Extension yellow band 2x10  Seated IR yellow band 2x10  Seated  ER yellow band  2x10  Ice 10 minutes to right shoulder   08/23/23 Pball roll out forward x10 Pball CW and CCW roll x10 Sitting  Thoracic extension against ball 3x20"  Scap squeeze with PT overpressure 2x10  Shoulder ext iso holds with PT pulling yellow TB x10  Shoulder ER and IR iso holds with PT pulling yellow TB 2x10  Row with PT pulling yellow TB 2x10  Shoulder ER with arm in scaption ~30 deg x10 AAROM Standing  Pendulums fwd/bwd, side to side x10 each  Wall pushup plus x10 Manual therapy:  Scapular mobilization Manual pec stretch  08/18/23 Attempted UBE but pt found it too painful today Pball roll out forward and side to side x10 each Pball roll CW & CCW x10 Pendulums fwd/bwd, side to side x10 Bicep stretch against doorway x30", attempted pec stretch but too much for pt Scap squeeze + pec overpressure for stretching x20 Scapular depression against chair x20 Performed sitting ER AROM but pt reporting increased pain after performing x10 Cervical retraction x20 UT stretch x30" R&L Standing ER red TB 2x10 Standing IR red TB 2x10 Thoracic extension against chair x10 Seated Row red TB x10 Scap retraction x10 Manual therapy:  Scapular mobilization Manual pec stretch Posterior joint mob grade II to III Inferior joint mob grade II to III  07/21/23 UBE L1 x9mins each way  Pec stretch in doorway Yellow band ER/IR 2x10 1# bicep curl 2x10 1# shoulder flexion and abduction 2x10 PROM in all directions MET to increase shoulder IR  Supine flexion AAROM with dowel x10 Supine abduction AAROM with dowel x10  Sidelying ER 1# 2x10   07/13/23 UBE L1 x3 mins each way  Wall slides 10x flexion and abd  Rows and ext red band 2x10 Standing AAROM flexion with 2# WaTE 2x10 Bicep curls with 2# WaTE 2x10 PROM in all directions and pec stretching  Ice pack to R shoulder for    EVAL 06/28/23   PATIENT EDUCATION: Education details: POC and HEP Person educated:  Patient Education method: Explanation Education comprehension: verbalized understanding  HOME EXERCISE PROGRAM: Access Code: [facility] URL: [email or web address] Date: 08/18/2023 Prepared by: Vernon Prey April Kirstie Peri  Exercises - Standing Shoulder Extension with Dowel  - 1 x daily - 7 x weekly - 2 sets - 10 reps - Standing Bilateral Shoulder Internal Rotation AAROM with Dowel  - 1 x daily - 7 x weekly - 2 sets - 10 reps - Shoulder Internal Rotation with Resistance  - 1 x daily - 7 x weekly - 2 sets - 10 reps - Shoulder External Rotation with Anchored Resistance  - 1 x daily - 7 x weekly - 2 sets - 10 reps - Doorway Pec Stretch at 60 Degrees Abduction with Arm Straight  - 1 x daily - 7 x weekly - 2 sets - 30 sec hold - Seated Bilateral Shoulder Flexion Towel Slide at Table Top  - 1 x daily - 7 x weekly - 2 sets - 10 reps - Seated Scapular Retraction  - 1 x daily - 7 x weekly - 2 sets - 10 reps - Seated Thoracic Extension AROM  - 1 x daily - 7 x weekly - 1 sets - 10 reps  ASSESSMENT:  CLINICAL IMPRESSION:  Patient is a 83 y.o. female who was seen today for physical therapy treatment for chronic shoulder pain and arthritis. She noted feeling as though her RA was having a flare up today. We did some light periscapular and RC strengthening with therabands. She tolerated it well, but felt sore afterwards. Towards the end of the session she was feeling too sore to move on, so we ended the session with icing to calm down some of the symptoms.She mentioned some pain going from her shoulder down to the dorsal radial portion of her wrist. Pt will continue to benefit from PT to help alleviate symptoms from RA and improve strength for better QOL.   OBJECTIVE IMPAIRMENTS: decreased mobility, decreased ROM, decreased strength, impaired UE functional use, and pain.     GOALS: Goals reviewed with patient? Yes  SHORT TERM GOALS: Target date: 08/09/23  Patient will be independent with  initial HEP.  Baseline: given 06/28/23  Goal status: INITIAL   LONG TERM GOALS: Target date: 09/20/23  Patient will be independent with advanced/ongoing HEP to improve outcomes and carryover.  Baseline:  Goal status: INITIAL  2.  Patient will report 75% improvement in R shoulder pain to improve QOL.  Baseline: 7/10 Goal status: INITIAL  3.  Patient to improve R shoulder AROM to [facility] without pain provocation to allow for increased ease of ADLs.  Baseline: see chart Goal status: INITIAL  4.  Patient will demonstrate improved functional UE strength as demonstrated by 4/5 or better. Baseline: see chart Goal status: INITIAL  5.  Patient will be able to put a shirt on over her head.  Baseline: stopped trying Goal status: INITIAL    PLAN:  PT FREQUENCY: 2x/week  PT DURATION: 12 weeks  PLANNED INTERVENTIONS: 97110-Therapeutic exercises, 97530- Therapeutic activity, 97112- Neuromuscular re-education, 97535- Self Care, 96045- Manual therapy, Patient/Family education, Taping, Dry Needling, Joint mobilization, Spinal mobilization, Cryotherapy, and Moist heat  PLAN FOR NEXT SESSION: shoulder AAROM, PROM, light strengthening as tolerated    Pascal Lux, Student-PT 08/25/2023, 10:43 AM

## 2023-08-25 ENCOUNTER — Ambulatory Visit: Payer: Medicare PPO

## 2023-08-25 DIAGNOSIS — M25611 Stiffness of right shoulder, not elsewhere classified: Secondary | ICD-10-CM | POA: Diagnosis not present

## 2023-08-25 DIAGNOSIS — M6281 Muscle weakness (generalized): Secondary | ICD-10-CM

## 2023-08-25 DIAGNOSIS — M25511 Pain in right shoulder: Secondary | ICD-10-CM | POA: Diagnosis not present

## 2023-08-25 DIAGNOSIS — G8929 Other chronic pain: Secondary | ICD-10-CM

## 2023-08-25 DIAGNOSIS — M25512 Pain in left shoulder: Secondary | ICD-10-CM | POA: Diagnosis not present

## 2023-08-26 DIAGNOSIS — E1140 Type 2 diabetes mellitus with diabetic neuropathy, unspecified: Secondary | ICD-10-CM | POA: Diagnosis not present

## 2023-08-26 DIAGNOSIS — F418 Other specified anxiety disorders: Secondary | ICD-10-CM | POA: Diagnosis not present

## 2023-08-26 DIAGNOSIS — M0579 Rheumatoid arthritis with rheumatoid factor of multiple sites without organ or systems involvement: Secondary | ICD-10-CM | POA: Diagnosis not present

## 2023-08-30 ENCOUNTER — Ambulatory Visit: Payer: Medicare PPO | Admitting: Physical Therapy

## 2023-09-01 ENCOUNTER — Ambulatory Visit: Payer: Medicare PPO | Admitting: Physical Therapy

## 2023-09-01 NOTE — Therapy (Deleted)
OUTPATIENT PHYSICAL THERAPY SHOULDER TREATMENT   Patient Name: Christy [name] MRN: [id number] DOB:May 04, 1941, 83 y.o., female Today's Date: 09/01/2023  END OF SESSION:     Past Medical History:  Diagnosis Date   Agatston coronary artery calcium score greater than 400    Ca score 1121 in July 2022   Allergy    Arthritis    Chronic constipation    Depression    Diabetes mellitus without complication (HCC)    History of breast cancer    HLD (hyperlipidemia)    PAT (paroxysmal atrial tachycardia) (HCC)    Ziopatch 02/2021   Pulmonary insufficiency    moderate by echo 11/23   Rheumatoid arthritis (HCC)    Past Surgical History:  Procedure Laterality Date   ABDOMINAL HYSTERECTOMY     ACHILLES TENDON REPAIR     BREAST REDUCTION SURGERY Left    CATARACT EXTRACTION     masectomy Right    TONSILLECTOMY AND ADENOIDECTOMY     Patient Active Problem List   Diagnosis Date Noted   Lip swelling 09/28/2022   Cognitive decline 09/02/2022   Insomnia 09/02/2022   Hearing loss 09/02/2022   Acute pain of left knee 05/10/2022   Rheumatoid arthritis of right foot (HCC) 03/31/2022   Type 2 diabetes mellitus with diabetic neuropathy, without long-term current use of insulin (HCC) 03/31/2022   Anxiety and depression 03/31/2022   Diarrhea of presumed infectious origin 03/31/2022   History of breast cancer 03/31/2022   Pulmonary insufficiency 05/13/2021   PAT (paroxysmal atrial tachycardia) (HCC) 04/14/2021   Agatston coronary artery calcium score greater than 400 04/14/2021   HLD (hyperlipidemia) 04/14/2021    PCP: Carilyn Goodpasture  REFERRING PROVIDER: Madelyn Brunner  REFERRING DIAG: chronic bilateral shoulder pain, right shoulder arthritis   THERAPY DIAG:  No diagnosis found.  Rationale for Evaluation and Treatment: Rehabilitation  ONSET DATE: "six months"  SUBJECTIVE:                                                                                                                                                                                       SUBJECTIVE STATEMENT: *** Shoulder feeling good today, but mentioned she was having some dizzy spells recently. Felt good going in and good going out after the last session. Feels as though her rheumatoid arthritis is having a flare up.   Hand dominance: Right  PERTINENT HISTORY: Christy [name] is a pleasant 83 y.o. female who presents today for bilateral, right greater than left shoulder pain. Right shoulder is the one that has been giving her issues for many months, left 1 only bothers her very intermittently and is not as bothersome. She did receive  decent relief from subacromial joint injection but only lasted for a week or 2 and then her pain returned. She would like to start formalized physical therapy. She is on methotrexate 2.5 mg daily for her rheumatoid arthritis. Has used Tylenol in the past but no consistent pain medication.   Pertinent ROS were reviewed with the patient and found to be negative unless otherwise specified above in HPI.   PAIN:  Are you having pain? Yes: NPRS scale: 4-5/10 Pain location: R shoulder and into hand  Pain description: sharp pain especially if I move the wrong way, worse in the mornings  Aggravating factors: reaching overhead, cooking sometimes especially if having to reach in the cupboards Relieving factors: ice packs  PRECAUTIONS: None  RED FLAGS: None   WEIGHT BEARING RESTRICTIONS: No  FALLS:  Has patient fallen in last 6 months? No  LIVING ENVIRONMENT: Lives with: lives with their family Lives in: House/apartment Stairs: No   PLOF: Independent and Independent with basic ADLs  PATIENT GOALS:to be more normal and have normal movements, not to be concerned with movements hurting  NEXT MD VISIT:   OBJECTIVE:  Note: Objective measures were completed at Evaluation unless otherwise noted.  DIAGNOSTIC FINDINGS:  X-rays show mild to moderate glenohumeral joint space  narrowing with  arthritic change.  There is a slightly higher pain indicative of rotator cuff arthropathy.  Mild AC joint arthropathy.  No acute fracture or bony abnormality noted   COGNITION: Overall cognitive status: Within functional limits for tasks assessed     SENSATION: WFL  POSTURE: Rounded shoulders   UPPER EXTREMITY ROM:   Active ROM Right eval Left eval  Shoulder flexion 115 w/pain 160 w/pain  Shoulder extension    Shoulder abduction 80 w/pain [facility]  Shoulder adduction    Shoulder internal rotation 55 w/pain [facility]  Shoulder external rotation 50 w/pain WFL  Elbow flexion    Elbow extension    Wrist flexion    Wrist extension    Wrist ulnar deviation    Wrist radial deviation    Wrist pronation    Wrist supination    (Blank rows = not tested)  UPPER EXTREMITY MMT:  MMT Right eval Left eval  Shoulder flexion 2- 3-  Shoulder extension    Shoulder abduction 2- 3-  Shoulder adduction    Shoulder internal rotation 3+ 3+  Shoulder external rotation 3+ 3+   Middle trapezius    Lower trapezius    Elbow flexion    Elbow extension    Wrist flexion    Wrist extension    Wrist ulnar deviation    Wrist radial deviation    Wrist pronation    Wrist supination    Grip strength (lbs)    (Blank rows = not tested)  SHOULDER SPECIAL TESTS: Impingement tests: Neer impingement test: positive , Hawkins/Kennedy impingement test: positive , and Painful arc test: positive  Rotator cuff assessment: Empty can test: positive   JOINT MOBILITY TESTING:  Increase muscle guarding and pain with PROM   PALPATION:  Some TTP    TODAY'S TREATMENT:  DATE:  08/25/23 Arm bike Level 1 for 6 min, only forwards (painful with backwards)  Wall slides, flexion 2x10 abduction 1x10  Row 2x10 yellow band Extension yellow band 2x10  Seated IR yellow band  2x10  Seated ER yellow band  2x10  Ice 10 minutes to right shoulder   08/23/23 Pball roll out forward x10 Pball CW and CCW roll x10 Sitting  Thoracic extension against ball 3x20"  Scap squeeze with PT overpressure 2x10  Shoulder ext iso holds with PT pulling yellow TB x10  Shoulder ER and IR iso holds with PT pulling yellow TB 2x10  Row with PT pulling yellow TB 2x10  Shoulder ER with arm in scaption ~30 deg x10 AAROM Standing  Pendulums fwd/bwd, side to side x10 each  Wall pushup plus x10 Manual therapy:  Scapular mobilization Manual pec stretch  08/18/23 Attempted UBE but pt found it too painful today Pball roll out forward and side to side x10 each Pball roll CW & CCW x10 Pendulums fwd/bwd, side to side x10 Bicep stretch against doorway x30", attempted pec stretch but too much for pt Scap squeeze + pec overpressure for stretching x20 Scapular depression against chair x20 Performed sitting ER AROM but pt reporting increased pain after performing x10 Cervical retraction x20 UT stretch x30" R&L Standing ER red TB 2x10 Standing IR red TB 2x10 Thoracic extension against chair x10 Seated Row red TB x10 Scap retraction x10 Manual therapy:  Scapular mobilization Manual pec stretch Posterior joint mob grade II to III Inferior joint mob grade II to III  07/21/23 UBE L1 x31mins each way  Pec stretch in doorway Yellow band ER/IR 2x10 1# bicep curl 2x10 1# shoulder flexion and abduction 2x10 PROM in all directions MET to increase shoulder IR  Supine flexion AAROM with dowel x10 Supine abduction AAROM with dowel x10  Sidelying ER 1# 2x10   07/13/23 UBE L1 x3 mins each way  Wall slides 10x flexion and abd  Rows and ext red band 2x10 Standing AAROM flexion with 2# WaTE 2x10 Bicep curls with 2# WaTE 2x10 PROM in all directions and pec stretching  Ice pack to R shoulder for    EVAL 06/28/23   PATIENT EDUCATION: Education details: POC and HEP Person educated:  Patient Education method: Explanation Education comprehension: verbalized understanding  HOME EXERCISE PROGRAM: Access Code: [facility] URL: [email or web address] Date: 08/18/2023 Prepared by: Vernon Prey April Kirstie Peri  Exercises - Standing Shoulder Extension with Dowel  - 1 x daily - 7 x weekly - 2 sets - 10 reps - Standing Bilateral Shoulder Internal Rotation AAROM with Dowel  - 1 x daily - 7 x weekly - 2 sets - 10 reps - Shoulder Internal Rotation with Resistance  - 1 x daily - 7 x weekly - 2 sets - 10 reps - Shoulder External Rotation with Anchored Resistance  - 1 x daily - 7 x weekly - 2 sets - 10 reps - Doorway Pec Stretch at 60 Degrees Abduction with Arm Straight  - 1 x daily - 7 x weekly - 2 sets - 30 sec hold - Seated Bilateral Shoulder Flexion Towel Slide at Table Top  - 1 x daily - 7 x weekly - 2 sets - 10 reps - Seated Scapular Retraction  - 1 x daily - 7 x weekly - 2 sets - 10 reps - Seated Thoracic Extension AROM  - 1 x daily - 7 x weekly - 1 sets - 10 reps  ASSESSMENT:  CLINICAL IMPRESSION:  Patient is a 83 y.o. female who was seen today for physical therapy treatment for chronic shoulder pain and arthritis. *** She noted feeling as though her RA was having a flare up today. We did some light periscapular and RC strengthening with therabands. She tolerated it well, but felt sore afterwards. Towards the end of the session she was feeling too sore to move on, so we ended the session with icing to calm down some of the symptoms.She mentioned some pain going from her shoulder down to the dorsal radial portion of her wrist. Pt will continue to benefit from PT to help alleviate symptoms from RA and improve strength for better QOL.   OBJECTIVE IMPAIRMENTS: decreased mobility, decreased ROM, decreased strength, impaired UE functional use, and pain.     GOALS: Goals reviewed with patient? Yes  SHORT TERM GOALS: Target date: 08/09/23  Patient will be independent with  initial HEP.  Baseline: given 06/28/23  Goal status: INITIAL   LONG TERM GOALS: Target date: 09/20/23  Patient will be independent with advanced/ongoing HEP to improve outcomes and carryover.  Baseline:  Goal status: INITIAL  2.  Patient will report 75% improvement in R shoulder pain to improve QOL.  Baseline: 7/10 Goal status: INITIAL  3.  Patient to improve R shoulder AROM to [facility] without pain provocation to allow for increased ease of ADLs.  Baseline: see chart Goal status: INITIAL  4.  Patient will demonstrate improved functional UE strength as demonstrated by 4/5 or better. Baseline: see chart Goal status: INITIAL  5.  Patient will be able to put a shirt on over her head.  Baseline: stopped trying Goal status: INITIAL    PLAN:  PT FREQUENCY: 2x/week  PT DURATION: 12 weeks  PLANNED INTERVENTIONS: 97110-Therapeutic exercises, 97530- Therapeutic activity, 97112- Neuromuscular re-education, 97535- Self Care, [id number]- Manual therapy, Patient/Family education, Taping, Dry Needling, Joint mobilization, Spinal mobilization, Cryotherapy, and Moist heat  PLAN FOR NEXT SESSION: shoulder AAROM, PROM, light strengthening as tolerated    Savon Bordonaro April Ma L Birl Lobello, PT 09/01/2023, 10:38 AM

## 2023-09-05 DIAGNOSIS — R944 Abnormal results of kidney function studies: Secondary | ICD-10-CM | POA: Diagnosis not present

## 2023-09-06 ENCOUNTER — Ambulatory Visit: Payer: Medicare PPO | Admitting: Physical Therapy

## 2023-09-06 DIAGNOSIS — M6281 Muscle weakness (generalized): Secondary | ICD-10-CM | POA: Diagnosis not present

## 2023-09-06 DIAGNOSIS — M25512 Pain in left shoulder: Secondary | ICD-10-CM | POA: Diagnosis not present

## 2023-09-06 DIAGNOSIS — M25511 Pain in right shoulder: Secondary | ICD-10-CM | POA: Diagnosis not present

## 2023-09-06 DIAGNOSIS — M25611 Stiffness of right shoulder, not elsewhere classified: Secondary | ICD-10-CM | POA: Diagnosis not present

## 2023-09-06 DIAGNOSIS — G8929 Other chronic pain: Secondary | ICD-10-CM

## 2023-09-06 NOTE — Therapy (Signed)
OUTPATIENT PHYSICAL THERAPY SHOULDER TREATMENT   Patient Name: Christy [name] MRN: [id number] DOB:24-May-1941, 83 y.o., female Today's Date: 09/06/2023  END OF SESSION:  PT End of Session - 09/06/23 1102     Visit Number 7    Date for PT Re-Evaluation 09/20/23    Authorization Type [employer]    Authorization - Number of Visits 24    PT Start Time 1102    PT Stop Time 1140    PT Time Calculation (min) 38 min    Activity Tolerance Patient tolerated treatment well               Past Medical History:  Diagnosis Date   Agatston coronary artery calcium score greater than 400    Ca score 1121 in July 2022   Allergy    Arthritis    Chronic constipation    Depression    Diabetes mellitus without complication (HCC)    History of breast cancer    HLD (hyperlipidemia)    PAT (paroxysmal atrial tachycardia) (HCC)    Ziopatch 02/2021   Pulmonary insufficiency    moderate by echo 11/23   Rheumatoid arthritis (HCC)    Past Surgical History:  Procedure Laterality Date   ABDOMINAL HYSTERECTOMY     ACHILLES TENDON REPAIR     BREAST REDUCTION SURGERY Left    CATARACT EXTRACTION     masectomy Right    TONSILLECTOMY AND ADENOIDECTOMY     Patient Active Problem List   Diagnosis Date Noted   Lip swelling 09/28/2022   Cognitive decline 09/02/2022   Insomnia 09/02/2022   Hearing loss 09/02/2022   Acute pain of left knee 05/10/2022   Rheumatoid arthritis of right foot (HCC) 03/31/2022   Type 2 diabetes mellitus with diabetic neuropathy, without long-term current use of insulin (HCC) 03/31/2022   Anxiety and depression 03/31/2022   Diarrhea of presumed infectious origin 03/31/2022   History of breast cancer 03/31/2022   Pulmonary insufficiency 05/13/2021   PAT (paroxysmal atrial tachycardia) (HCC) 04/14/2021   Agatston coronary artery calcium score greater than 400 04/14/2021   HLD (hyperlipidemia) 04/14/2021    PCP: Carilyn Goodpasture  REFERRING PROVIDER: Madelyn Brunner  REFERRING  DIAG: chronic bilateral shoulder pain, right shoulder arthritis   THERAPY DIAG:  Chronic right shoulder pain  Stiffness of right shoulder, not elsewhere classified  Muscle weakness (generalized)  Chronic left shoulder pain  Rationale for Evaluation and Treatment: Rehabilitation  ONSET DATE: "six months"  SUBJECTIVE:  SUBJECTIVE STATEMENT: Pt states she was feeling good but 2 days ago her right shoulder to her forearm started to ache again. "Just a little sore"  Hand dominance: Right  PERTINENT HISTORY: Christy [name] is a pleasant 83 y.o. female who presents today for bilateral, right greater than left shoulder pain. Right shoulder is the one that has been giving her issues for many months, left 1 only bothers her very intermittently and is not as bothersome. She did receive decent relief from subacromial joint injection but only lasted for a week or 2 and then her pain returned. She would like to start formalized physical therapy. She is on methotrexate 2.5 mg daily for her rheumatoid arthritis. Has used Tylenol in the past but no consistent pain medication.   Pertinent ROS were reviewed with the patient and found to be negative unless otherwise specified above in HPI.   PAIN:  Are you having pain? Yes: NPRS scale: 3/10 Pain location: R shoulder and into elbow/forearm  Pain description: sharp pain especially if I move the wrong way, worse in the mornings  Aggravating factors: reaching overhead, cooking sometimes especially if having to reach in the cupboards Relieving factors: ice packs  PRECAUTIONS: None  RED FLAGS: None   WEIGHT BEARING RESTRICTIONS: No  FALLS:  Has patient fallen in last 6 months? No  LIVING ENVIRONMENT: Lives with: lives with their family Lives in: House/apartment Stairs:  No   PLOF: Independent and Independent with basic ADLs  PATIENT GOALS:to be more normal and have normal movements, not to be concerned with movements hurting  NEXT MD VISIT:   OBJECTIVE:  Note: Objective measures were completed at Evaluation unless otherwise noted.  DIAGNOSTIC FINDINGS:  X-rays show mild to moderate glenohumeral joint space narrowing with  arthritic change.  There is a slightly higher pain indicative of rotator cuff arthropathy.  Mild AC joint arthropathy.  No acute fracture or bony abnormality noted   POSTURE: Rounded shoulders   UPPER EXTREMITY ROM:   Active ROM Right eval Left eval  Shoulder flexion 115 w/pain 160 w/pain  Shoulder extension    Shoulder abduction 80 w/pain [facility]  Shoulder adduction    Shoulder internal rotation 55 w/pain [facility]  Shoulder external rotation 50 w/pain [facility]  Elbow flexion    Elbow extension    Wrist flexion    Wrist extension    Wrist ulnar deviation    Wrist radial deviation    Wrist pronation    Wrist supination    (Blank rows = not tested)  UPPER EXTREMITY MMT:  MMT Right eval Left eval  Shoulder flexion 2- 3-  Shoulder extension    Shoulder abduction 2- 3-  Shoulder adduction    Shoulder internal rotation 3+ 3+  Shoulder external rotation 3+ 3+   Middle trapezius    Lower trapezius    Elbow flexion    Elbow extension    Wrist flexion    Wrist extension    Wrist ulnar deviation    Wrist radial deviation    Wrist pronation    Wrist supination    Grip strength (lbs)    (Blank rows = not tested)  SHOULDER SPECIAL TESTS: Impingement tests: Neer impingement test: positive , Hawkins/Kennedy impingement test: positive , and Painful arc test: positive  Rotator cuff assessment: Empty can test: positive   JOINT MOBILITY TESTING:  Increase muscle guarding and pain with PROM   PALPATION:  Some TTP    TODAY'S TREATMENT:  DATE:  09/06/23 Sitting  Thoracic extension against ball 3x20" Thoracic ext + rotation x10  Scap squeeze with PT overpressure 2x10  Bicep stretch x30"  Tricep iso holds to PT resistance 2x10  Shoulder and elbow ext reactive iso red TB 2x10  Forearm pron/sup with elbow bent at 90 deg x10  Low Row red TB 2x10  Mid row red TB x10  08/25/23 Arm bike Level 1 for 6 min, only forwards (painful with backwards)  Wall slides, flexion 2x10 abduction 1x10  Row 2x10 yellow band Extension yellow band 2x10  Seated IR yellow band 2x10  Seated ER yellow band  2x10  Ice 10 minutes to right shoulder   08/23/23 Pball roll out forward x10 Pball CW and CCW roll x10 Sitting  Thoracic extension against ball 3x20"  Scap squeeze with PT overpressure 2x10  Shoulder ext iso holds with PT pulling yellow TB x10  Shoulder ER and IR iso holds with PT pulling yellow TB 2x10  Row with PT pulling yellow TB 2x10  Shoulder ER with arm in scaption ~30 deg x10 AAROM Standing  Pendulums fwd/bwd, side to side x10 each  Wall pushup plus x10 Manual therapy:  Scapular mobilization Manual pec stretch  08/18/23 Attempted UBE but pt found it too painful today Pball roll out forward and side to side x10 each Pball roll CW & CCW x10 Pendulums fwd/bwd, side to side x10 Bicep stretch against doorway x30", attempted pec stretch but too much for pt Scap squeeze + pec overpressure for stretching x20 Scapular depression against chair x20 Performed sitting ER AROM but pt reporting increased pain after performing x10 Cervical retraction x20 UT stretch x30" R&L Standing ER red TB 2x10 Standing IR red TB 2x10 Thoracic extension against chair x10 Seated Row red TB x10 Scap retraction x10 Manual therapy:  Scapular mobilization Manual pec stretch Posterior joint mob grade II to III Inferior joint mob grade II to III  07/21/23 UBE L1 x45mins each way  Pec stretch in  doorway Yellow band ER/IR 2x10 1# bicep curl 2x10 1# shoulder flexion and abduction 2x10 PROM in all directions MET to increase shoulder IR  Supine flexion AAROM with dowel x10 Supine abduction AAROM with dowel x10  Sidelying ER 1# 2x10   07/13/23 UBE L1 x3 mins each way  Wall slides 10x flexion and abd  Rows and ext red band 2x10 Standing AAROM flexion with 2# WaTE 2x10 Bicep curls with 2# WaTE 2x10 PROM in all directions and pec stretching  Ice pack to R shoulder for    EVAL 06/28/23   PATIENT EDUCATION: Education details: POC and HEP Person educated: Patient Education method: Explanation Education comprehension: verbalized understanding  HOME EXERCISE PROGRAM: Access Code: [facility] URL: [email or web address] Date: 08/18/2023 Prepared by: Vernon Prey April Kirstie Peri  Exercises - Standing Shoulder Extension with Dowel  - 1 x daily - 7 x weekly - 2 sets - 10 reps - Standing Bilateral Shoulder Internal Rotation AAROM with Dowel  - 1 x daily - 7 x weekly - 2 sets - 10 reps - Shoulder Internal Rotation with Resistance  - 1 x daily - 7 x weekly - 2 sets - 10 reps - Shoulder External Rotation with Anchored Resistance  - 1 x daily - 7 x weekly - 2 sets - 10 reps - Doorway Pec Stretch at 60 Degrees Abduction with Arm Straight  - 1 x daily - 7 x weekly - 2 sets - 30 sec hold - Seated Bilateral Shoulder Flexion  Towel Slide at Table Top  - 1 x daily - 7 x weekly - 2 sets - 10 reps - Seated Scapular Retraction  - 1 x daily - 7 x weekly - 2 sets - 10 reps - Seated Thoracic Extension AROM  - 1 x daily - 7 x weekly - 1 sets - 10 reps  ASSESSMENT:  CLINICAL IMPRESSION: Patient is a 83 y.o. female who was seen today for physical therapy treatment for chronic shoulder pain and arthritis. Pt has been sick for the past few days and has not been able to do all of her HEP. Has had a slight exacerbation in her R shoulder pain for the last 2 days. Found pt to have a very  tight bicep today with pulling from the front of her shoulder into her elbow/forearm. Provided stretches and gentle strengthening of tricep to try and improve this. Continued to work on scapular strengthening for her gross shoulder movement.   OBJECTIVE IMPAIRMENTS: decreased mobility, decreased ROM, decreased strength, impaired UE functional use, and pain.     GOALS: Goals reviewed with patient? Yes  SHORT TERM GOALS: Target date: 08/09/23  Patient will be independent with initial HEP.  Baseline: given 06/28/23  Goal status: INITIAL   LONG TERM GOALS: Target date: 09/20/23  Patient will be independent with advanced/ongoing HEP to improve outcomes and carryover.  Baseline:  Goal status: INITIAL  2.  Patient will report 75% improvement in R shoulder pain to improve QOL.  Baseline: 7/10 Goal status: INITIAL  3.  Patient to improve R shoulder AROM to [facility] without pain provocation to allow for increased ease of ADLs.  Baseline: see chart Goal status: INITIAL  4.  Patient will demonstrate improved functional UE strength as demonstrated by 4/5 or better. Baseline: see chart Goal status: INITIAL  5.  Patient will be able to put a shirt on over her head.  Baseline: stopped trying Goal status: INITIAL    PLAN:  PT FREQUENCY: 2x/week  PT DURATION: 12 weeks  PLANNED INTERVENTIONS: 97110-Therapeutic exercises, 97530- Therapeutic activity, 97112- Neuromuscular re-education, 97535- Self Care, [id number]- Manual therapy, Patient/Family education, Taping, Dry Needling, Joint mobilization, Spinal mobilization, Cryotherapy, and Moist heat  PLAN FOR NEXT SESSION: Stretch bicep/anterior shoulder, thoracic extension/mobility, strengthen tricep and shoulder extensors; shoulder AAROM, PROM, light strengthening as tolerated    Jaelin Devincentis April Ma L Schae Cando, PT 09/06/2023, 11:02 AM

## 2023-09-08 ENCOUNTER — Ambulatory Visit: Payer: Medicare PPO | Admitting: Physical Therapy

## 2023-09-08 DIAGNOSIS — G8929 Other chronic pain: Secondary | ICD-10-CM

## 2023-09-08 DIAGNOSIS — M25611 Stiffness of right shoulder, not elsewhere classified: Secondary | ICD-10-CM

## 2023-09-08 DIAGNOSIS — M6281 Muscle weakness (generalized): Secondary | ICD-10-CM

## 2023-09-08 DIAGNOSIS — M25512 Pain in left shoulder: Secondary | ICD-10-CM | POA: Diagnosis not present

## 2023-09-08 DIAGNOSIS — M25511 Pain in right shoulder: Secondary | ICD-10-CM | POA: Diagnosis not present

## 2023-09-08 NOTE — Therapy (Signed)
OUTPATIENT PHYSICAL THERAPY SHOULDER TREATMENT   Patient Name: Christy [name] MRN: [id number] DOB:1940/11/04, 83 y.o., female Today's Date: 09/08/2023  END OF SESSION:  PT End of Session - 09/08/23 1431     Visit Number 8    Date for PT Re-Evaluation 09/20/23    Authorization Type [employer]    Authorization - Number of Visits 24    PT Start Time 1431    PT Stop Time 1510    PT Time Calculation (min) 39 min    Activity Tolerance Patient tolerated treatment well             Past Medical History:  Diagnosis Date   Agatston coronary artery calcium score greater than 400    Ca score 1121 in July 2022   Allergy    Arthritis    Chronic constipation    Depression    Diabetes mellitus without complication (HCC)    History of breast cancer    HLD (hyperlipidemia)    PAT (paroxysmal atrial tachycardia) (HCC)    [place] 02/2021   Pulmonary insufficiency    moderate by echo 11/23   Rheumatoid arthritis (HCC)    Past Surgical History:  Procedure Laterality Date   ABDOMINAL HYSTERECTOMY     ACHILLES TENDON REPAIR     BREAST REDUCTION SURGERY Left    CATARACT EXTRACTION     masectomy Right    TONSILLECTOMY AND ADENOIDECTOMY     Patient Active Problem List   Diagnosis Date Noted   Lip swelling 09/28/2022   Cognitive decline 09/02/2022   Insomnia 09/02/2022   Hearing loss 09/02/2022   Acute pain of left knee 05/10/2022   Rheumatoid arthritis of right foot (HCC) 03/31/2022   Type 2 diabetes mellitus with diabetic neuropathy, without long-term current use of insulin (HCC) 03/31/2022   Anxiety and depression 03/31/2022   Diarrhea of presumed infectious origin 03/31/2022   History of breast cancer 03/31/2022   Pulmonary insufficiency 05/13/2021   PAT (paroxysmal atrial tachycardia) (HCC) 04/14/2021   Agatston coronary artery calcium score greater than 400 04/14/2021   HLD (hyperlipidemia) 04/14/2021    PCP: Christy [name]  REFERRING PROVIDER: Madelyn [name]  REFERRING  DIAG: chronic bilateral shoulder pain, right shoulder arthritis   THERAPY DIAG:  Chronic right shoulder pain  Stiffness of right shoulder, not elsewhere classified  Muscle weakness (generalized)  Chronic left shoulder pain  Rationale for Evaluation and Treatment: Rehabilitation  ONSET DATE: "six months"  SUBJECTIVE:  SUBJECTIVE STATEMENT: Pt states she felt good the day after last session. Very little pain. Did not wake up with pain this morning  Hand dominance: Right  PERTINENT HISTORY: Christy [name] is a pleasant 83 y.o. female who presents today for bilateral, right greater than left shoulder pain. Right shoulder is the one that has been giving her issues for many months, left 1 only bothers her very intermittently and is not as bothersome. She did receive decent relief from subacromial joint injection but only lasted for a week or 2 and then her pain returned. She would like to start formalized physical therapy. She is on methotrexate 2.5 mg daily for her rheumatoid arthritis. Has used Tylenol in the past but no consistent pain medication.   Pertinent ROS were reviewed with the patient and found to be negative unless otherwise specified above in HPI.   PAIN:  Are you having pain? Yes: NPRS scale: 1/10 Pain location: R shoulder and into elbow/forearm  Pain description: sharp pain especially if I move the wrong way, worse in the mornings  Aggravating factors: reaching overhead, cooking sometimes especially if having to reach in the cupboards Relieving factors: ice packs  PRECAUTIONS: None  RED FLAGS: None   WEIGHT BEARING RESTRICTIONS: No  FALLS:  Has patient fallen in last 6 months? No  LIVING ENVIRONMENT: Lives with: lives with their family Lives in: House/apartment Stairs: No   PLOF:  Independent and Independent with basic ADLs  PATIENT GOALS:to be more normal and have normal movements, not to be concerned with movements hurting  NEXT MD VISIT:   OBJECTIVE:  Note: Objective measures were completed at Evaluation unless otherwise noted.  DIAGNOSTIC FINDINGS:  X-rays show mild to moderate glenohumeral joint space narrowing with  arthritic change.  There is a slightly higher pain indicative of rotator cuff arthropathy.  Mild AC joint arthropathy.  No acute fracture or bony abnormality noted   POSTURE: Rounded shoulders   UPPER EXTREMITY ROM:   Active ROM Right eval Left eval  Shoulder flexion 115 w/pain 160 w/pain  Shoulder extension    Shoulder abduction 80 w/pain [facility]  Shoulder adduction    Shoulder internal rotation 55 w/pain [facility]  Shoulder external rotation 50 w/pain [facility]  Elbow flexion    Elbow extension    Wrist flexion    Wrist extension    Wrist ulnar deviation    Wrist radial deviation    Wrist pronation    Wrist supination    (Blank rows = not tested)  UPPER EXTREMITY MMT:  MMT Right eval Left eval  Shoulder flexion 2- 3-  Shoulder extension    Shoulder abduction 2- 3-  Shoulder adduction    Shoulder internal rotation 3+ 3+  Shoulder external rotation 3+ 3+   Middle trapezius    Lower trapezius    Elbow flexion    Elbow extension    Wrist flexion    Wrist extension    Wrist ulnar deviation    Wrist radial deviation    Wrist pronation    Wrist supination    Grip strength (lbs)    (Blank rows = not tested)  SHOULDER SPECIAL TESTS: Impingement tests: Neer impingement test: positive , Hawkins/Kennedy impingement test: positive , and Painful arc test: positive  Rotator cuff assessment: Empty can test: positive   JOINT MOBILITY TESTING:  Increase muscle guarding and pain with PROM   PALPATION:  Some TTP    TODAY'S TREATMENT:  DATE:  09/08/23 Sitting  Thoracic extension against ball x10  Scap squeeze with PT overpressure 2x10  Shoulder horizontal abd low yellow TB 2x10  Mid row yellow TB 2x10  High row yellow TB 2x10  Shoulder ER AROM 2x10  Shoulder ER at 60 deg of scaption 2x10 AAROM  Thoracic extension with hands behind head x10  Shoulder flexion AROM 2x5 while maintaining thoracic extension  Shoulder scaption AROM x5 while maintaining thoracic extension   09/06/23 Sitting  Thoracic extension against ball 3x20" Thoracic ext + rotation x10  Scap squeeze with PT overpressure 2x10  Bicep stretch x30"  Tricep iso holds to PT resistance 2x10  Shoulder and elbow ext reactive iso red TB 2x10  Forearm pron/sup with elbow bent at 90 deg x10  Low Row red TB 2x10  Mid row red TB x10  08/25/23 Arm bike Level 1 for 6 min, only forwards (painful with backwards)  Wall slides, flexion 2x10 abduction 1x10  Row 2x10 yellow band Extension yellow band 2x10  Seated IR yellow band 2x10  Seated ER yellow band  2x10  Ice 10 minutes to right shoulder   08/23/23 Pball roll out forward x10 Pball CW and CCW roll x10 Sitting  Thoracic extension against ball 3x20"  Scap squeeze with PT overpressure 2x10  Shoulder ext iso holds with PT pulling yellow TB x10  Shoulder ER and IR iso holds with PT pulling yellow TB 2x10  Row with PT pulling yellow TB 2x10  Shoulder ER with arm in scaption ~30 deg x10 AAROM Standing  Pendulums fwd/bwd, side to side x10 each  Wall pushup plus x10 Manual therapy:  Scapular mobilization Manual pec stretch  08/18/23 Attempted UBE but pt found it too painful today Pball roll out forward and side to side x10 each Pball roll CW & CCW x10 Pendulums fwd/bwd, side to side x10 Bicep stretch against doorway x30", attempted pec stretch but too much for pt Scap squeeze + pec overpressure for stretching x20 Scapular depression against chair x20 Performed sitting ER  AROM but pt reporting increased pain after performing x10 Cervical retraction x20 UT stretch x30" R&L Standing ER red TB 2x10 Standing IR red TB 2x10 Thoracic extension against chair x10 Seated Row red TB x10 Scap retraction x10 Manual therapy:  Scapular mobilization Manual pec stretch Posterior joint mob grade II to III Inferior joint mob grade II to III  07/21/23 UBE L1 x18mins each way  Pec stretch in doorway Yellow band ER/IR 2x10 1# bicep curl 2x10 1# shoulder flexion and abduction 2x10 PROM in all directions MET to increase shoulder IR  Supine flexion AAROM with dowel x10 Supine abduction AAROM with dowel x10  Sidelying ER 1# 2x10   07/13/23 UBE L1 x3 mins each way  Wall slides 10x flexion and abd  Rows and ext red band 2x10 Standing AAROM flexion with 2# WaTE 2x10 Bicep curls with 2# WaTE 2x10 PROM in all directions and pec stretching  Ice pack to R shoulder for    EVAL 06/28/23   PATIENT EDUCATION: Education details: POC and HEP Person educated: Patient Education method: Explanation Education comprehension: verbalized understanding  HOME EXERCISE PROGRAM: Access Code: [facility] URL: [email or web address] Date: 08/18/2023 Prepared by: Vernon Prey April Kirstie Peri  Exercises - Standing Shoulder Extension with Dowel  - 1 x daily - 7 x weekly - 2 sets - 10 reps - Standing Bilateral Shoulder Internal Rotation AAROM with Dowel  - 1 x daily - 7 x weekly - 2 sets -  10 reps - Shoulder Internal Rotation with Resistance  - 1 x daily - 7 x weekly - 2 sets - 10 reps - Shoulder External Rotation with Anchored Resistance  - 1 x daily - 7 x weekly - 2 sets - 10 reps - Doorway Pec Stretch at 60 Degrees Abduction with Arm Straight  - 1 x daily - 7 x weekly - 2 sets - 30 sec hold - Seated Bilateral Shoulder Flexion Towel Slide at Table Top  - 1 x daily - 7 x weekly - 2 sets - 10 reps - Seated Scapular Retraction  - 1 x daily - 7 x weekly - 2 sets - 10  reps - Seated Thoracic Extension AROM  - 1 x daily - 7 x weekly - 1 sets - 10 reps  ASSESSMENT:  CLINICAL IMPRESSION: Patient is a 83 y.o. female who was seen today for physical therapy treatment for chronic shoulder pain and arthritis. Pt with reduced pain. Treatment focused on scapular and posterior shoulder strengthening. Improving thoracic mobility but still has very limited shoulder ER affecting shoulder abd. Educated pt today on how thoracic mobility/posture effects shoulder mobility -- pt able to verbalize understanding. No pain with concentric flexion but does get some pain with eccentric control coming down.   OBJECTIVE IMPAIRMENTS: decreased mobility, decreased ROM, decreased strength, impaired UE functional use, and pain.     GOALS: Goals reviewed with patient? Yes  SHORT TERM GOALS: Target date: 08/09/23  Patient will be independent with initial HEP.  Baseline: given 06/28/23  Goal status: MET   LONG TERM GOALS: Target date: 09/20/23  Patient will be independent with advanced/ongoing HEP to improve outcomes and carryover.  Baseline:  Goal status: INITIAL  2.  Patient will report 75% improvement in R shoulder pain to improve QOL.  Baseline: 7/10 Goal status: INITIAL  3.  Patient to improve R shoulder AROM to [facility] without pain provocation to allow for increased ease of ADLs.  Baseline: see chart Goal status: INITIAL  4.  Patient will demonstrate improved functional UE strength as demonstrated by 4/5 or better. Baseline: see chart Goal status: INITIAL  5.  Patient will be able to put a shirt on over her head.  Baseline: stopped trying Goal status: INITIAL    PLAN:  PT FREQUENCY: 2x/week  PT DURATION: 12 weeks  PLANNED INTERVENTIONS: 97110-Therapeutic exercises, 97530- Therapeutic activity, 97112- Neuromuscular re-education, 97535- Self Care, [id number]- Manual therapy, Patient/Family education, Taping, Dry Needling, Joint mobilization, Spinal mobilization,  Cryotherapy, and Moist heat  PLAN FOR NEXT SESSION: Stretch bicep/anterior shoulder, thoracic extension/mobility, strengthen tricep and shoulder extensors/posterior shoulder; shoulder AAROM, PROM, light strengthening as tolerated    Albeiro Trompeter April Ma L Lucciana Head, PT 09/08/2023, 2:31 PM

## 2023-09-13 ENCOUNTER — Ambulatory Visit: Payer: Medicare PPO | Attending: Family Medicine | Admitting: Physical Therapy

## 2023-09-13 DIAGNOSIS — M25611 Stiffness of right shoulder, not elsewhere classified: Secondary | ICD-10-CM | POA: Diagnosis not present

## 2023-09-13 DIAGNOSIS — M25511 Pain in right shoulder: Secondary | ICD-10-CM | POA: Insufficient documentation

## 2023-09-13 DIAGNOSIS — M6281 Muscle weakness (generalized): Secondary | ICD-10-CM | POA: Insufficient documentation

## 2023-09-13 DIAGNOSIS — M25512 Pain in left shoulder: Secondary | ICD-10-CM | POA: Diagnosis not present

## 2023-09-13 DIAGNOSIS — G8929 Other chronic pain: Secondary | ICD-10-CM | POA: Insufficient documentation

## 2023-09-13 NOTE — Therapy (Signed)
 OUTPATIENT PHYSICAL THERAPY SHOULDER TREATMENT   Patient Name: Christy [name] MRN: [id number] DOB:1941-02-17, 83 y.o., female Today's Date: 09/13/2023  END OF SESSION:  PT End of Session - 09/13/23 1402     Visit Number 9    Date for PT Re-Evaluation 09/20/23    Authorization Type [employer]    PT Start Time 1400    PT Stop Time 1440    PT Time Calculation (min) 40 min             Past Medical History:  Diagnosis Date   Agatston coronary artery calcium  score greater than 400    Ca score 1121 in July 2022   Allergy    Arthritis    Chronic constipation    Depression    Diabetes mellitus without complication (HCC)    History of breast cancer    HLD (hyperlipidemia)    PAT (paroxysmal atrial tachycardia) (HCC)    [place] 02/2021   Pulmonary insufficiency    moderate by echo 11/23   Rheumatoid arthritis (HCC)    Past Surgical History:  Procedure Laterality Date   ABDOMINAL HYSTERECTOMY     ACHILLES TENDON REPAIR     BREAST REDUCTION SURGERY Left    CATARACT EXTRACTION     masectomy Right    TONSILLECTOMY AND ADENOIDECTOMY     Patient Active Problem List   Diagnosis Date Noted   Lip swelling 09/28/2022   Cognitive decline 09/02/2022   Insomnia 09/02/2022   Hearing loss 09/02/2022   Acute pain of left knee 05/10/2022   Rheumatoid arthritis of right foot (HCC) 03/31/2022   Type 2 diabetes mellitus with diabetic neuropathy, without long-term current use of insulin  (HCC) 03/31/2022   Anxiety and depression 03/31/2022   Diarrhea of presumed infectious origin 03/31/2022   History of breast cancer 03/31/2022   Pulmonary insufficiency 05/13/2021   PAT (paroxysmal atrial tachycardia) (HCC) 04/14/2021   Agatston coronary artery calcium  score greater than 400 04/14/2021   HLD (hyperlipidemia) 04/14/2021    PCP: Lyle Bertrand  REFERRING PROVIDER: Lonell Sprang  REFERRING DIAG: chronic bilateral shoulder pain, right shoulder arthritis   THERAPY DIAG:  Chronic right  shoulder pain  Stiffness of right shoulder, not elsewhere classified  Muscle weakness (generalized)  Chronic left shoulder pain  Rationale for Evaluation and Treatment: Rehabilitation  ONSET DATE: six months  SUBJECTIVE:                                                                                                                                                                                      SUBJECTIVE STATEMENT: Pt amb in with stiff upper body. States RT UE pain comes and goes with  mvmt Hand dominance: Right  PERTINENT HISTORY: Christy [name] is a pleasant 83 y.o. female who presents today for bilateral, right greater than left shoulder pain. Right shoulder is the one that has been giving her issues for many months, left 1 only bothers her very intermittently and is not as bothersome. She did receive decent relief from subacromial joint injection but only lasted for a week or 2 and then her pain returned. She would like to start formalized physical therapy. She is on methotrexate 2.5 mg daily for her rheumatoid arthritis. Has used Tylenol  in the past but no consistent pain medication.   Pertinent ROS were reviewed with the patient and found to be negative unless otherwise specified above in HPI.   PAIN:  Are you having pain? Yes: NPRS scale: 1/10 Pain location: R shoulder and into elbow/forearm  Pain description: sharp pain especially if I move the wrong way, worse in the mornings  Aggravating factors: reaching overhead, cooking sometimes especially if having to reach in the cupboards Relieving factors: ice packs  PRECAUTIONS: None  RED FLAGS: None   WEIGHT BEARING RESTRICTIONS: No  FALLS:  Has patient fallen in last 6 months? No  LIVING ENVIRONMENT: Lives with: lives with their family Lives in: House/apartment Stairs: No   PLOF: Independent and Independent with basic ADLs  PATIENT GOALS:to be more normal and have normal movements, not to be concerned with  movements hurting  NEXT MD VISIT:   OBJECTIVE:  Note: Objective measures were completed at Evaluation unless otherwise noted.  DIAGNOSTIC FINDINGS:  X-rays show mild to moderate glenohumeral joint space narrowing with  arthritic change.  There is a slightly higher pain indicative of rotator cuff arthropathy.  Mild AC joint arthropathy.  No acute fracture or bony abnormality noted   POSTURE: Rounded shoulders   UPPER EXTREMITY ROM:   Active ROM Right eval Left eval  Shoulder flexion 115 w/pain 160 w/pain  Shoulder extension    Shoulder abduction 80 w/pain [facility]  Shoulder adduction    Shoulder internal rotation 55 w/pain [facility]  Shoulder external rotation 50 w/pain [facility]  Elbow flexion    Elbow extension    Wrist flexion    Wrist extension    Wrist ulnar deviation    Wrist radial deviation    Wrist pronation    Wrist supination    (Blank rows = not tested)  UPPER EXTREMITY MMT:  MMT Right eval Left eval  Shoulder flexion 2- 3-  Shoulder extension    Shoulder abduction 2- 3-  Shoulder adduction    Shoulder internal rotation 3+ 3+  Shoulder external rotation 3+ 3+   Middle trapezius    Lower trapezius    Elbow flexion    Elbow extension    Wrist flexion    Wrist extension    Wrist ulnar deviation    Wrist radial deviation    Wrist pronation    Wrist supination    Grip strength (lbs)    (Blank rows = not tested)  SHOULDER SPECIAL TESTS: Impingement tests: Neer impingement test: positive , Hawkins/Kennedy impingement test: positive , and Painful arc test: positive  Rotator cuff assessment: Empty can test: positive   JOINT MOBILITY TESTING:  Increase muscle guarding and pain with PROM   PALPATION:  Some TTP    TODAY'S TREATMENT:  DATE:   09/13/23 UBE L2 min fwd/2 min back 5# shld ext 10 x with assist 5# shld row 10 x with  assist 3# chest press 10 x 1 # shld chest press into OH 2 sets 5 standing Postural wall angles 10 x 1# shld abd 3 sets 5 Yellow tband scap stab ex-various postions Thor ext with tband Mid row yellow TB 2x10 High row yellow TB 2x10 Shoulder ER AROM 10 x then yellow tband IR and ER 10 x each PROM RT shld with good ROM compared to act ex weakness limits ROM   09/08/23 Sitting  Thoracic extension against ball x10  Scap squeeze with PT overpressure 2x10  Shoulder horizontal abd low yellow TB 2x10  Mid row yellow TB 2x10  High row yellow TB 2x10  Shoulder ER AROM 2x10  Shoulder ER at 60 deg of scaption 2x10 AAROM  Thoracic extension with hands behind head x10  Shoulder flexion AROM 2x5 while maintaining thoracic extension  Shoulder scaption AROM x5 while maintaining thoracic extension   09/06/23 Sitting  Thoracic extension against ball 3x20 Thoracic ext + rotation x10  Scap squeeze with PT overpressure 2x10  Bicep stretch x30  Tricep iso holds to PT resistance 2x10  Shoulder and elbow ext reactive iso red TB 2x10  Forearm pron/sup with elbow bent at 90 deg x10  Low Row red TB 2x10  Mid row red TB x10  08/25/23 Arm bike Level 1 for 6 min, only forwards (painful with backwards)  Wall slides, flexion 2x10 abduction 1x10  Row 2x10 yellow band Extension yellow band 2x10  Seated IR yellow band 2x10  Seated ER yellow band  2x10  Ice 10 minutes to right shoulder   08/23/23 Pball roll out forward x10 Pball CW and CCW roll x10 Sitting  Thoracic extension against ball 3x20  Scap squeeze with PT overpressure 2x10  Shoulder ext iso holds with PT pulling yellow TB x10  Shoulder ER and IR iso holds with PT pulling yellow TB 2x10  Row with PT pulling yellow TB 2x10  Shoulder ER with arm in scaption ~30 deg x10 AAROM Standing  Pendulums fwd/bwd, side to side x10 each  Wall pushup plus x10 Manual therapy:  Scapular mobilization Manual pec stretch  08/18/23 Attempted UBE but pt  found it too painful today Pball roll out forward and side to side x10 each Pball roll CW & CCW x10 Pendulums fwd/bwd, side to side x10 Bicep stretch against doorway x30, attempted pec stretch but too much for pt Scap squeeze + pec overpressure for stretching x20 Scapular depression against chair x20 Performed sitting ER AROM but pt reporting increased pain after performing x10 Cervical retraction x20 UT stretch x30 R&L Standing ER red TB 2x10 Standing IR red TB 2x10 Thoracic extension against chair x10 Seated Row red TB x10 Scap retraction x10 Manual therapy:  Scapular mobilization Manual pec stretch Posterior joint mob grade II to III Inferior joint mob grade II to III  07/21/23 UBE L1 x41mins each way  Pec stretch in doorway Yellow band ER/IR 2x10 1# bicep curl 2x10 1# shoulder flexion and abduction 2x10 PROM in all directions MET to increase shoulder IR  Supine flexion AAROM with dowel x10 Supine abduction AAROM with dowel x10  Sidelying ER 1# 2x10   07/13/23 UBE L1 x3 mins each way  Wall slides 10x flexion and abd  Rows and ext red band 2x10 Standing AAROM flexion with 2# WaTE 2x10 Bicep curls with 2# WaTE 2x10 PROM  in all directions and pec stretching  Ice pack to R shoulder for    EVAL 06/28/23   PATIENT EDUCATION: Education details: POC and HEP Person educated: Patient Education method: Explanation Education comprehension: verbalized understanding  HOME EXERCISE PROGRAM: Access Code: [facility] URL: [email or web address] Date: 08/18/2023 Prepared by: Gellen April Earnie Starring  Exercises - Standing Shoulder Extension with Dowel  - 1 x daily - 7 x weekly - 2 sets - 10 reps - Standing Bilateral Shoulder Internal Rotation AAROM with Dowel  - 1 x daily - 7 x weekly - 2 sets - 10 reps - Shoulder Internal Rotation with Resistance  - 1 x daily - 7 x weekly - 2 sets - 10 reps - Shoulder External Rotation with Anchored Resistance  - 1 x  daily - 7 x weekly - 2 sets - 10 reps - Doorway Pec Stretch at 60 Degrees Abduction with Arm Straight  - 1 x daily - 7 x weekly - 2 sets - 30 sec hold - Seated Bilateral Shoulder Flexion Towel Slide at Table Top  - 1 x daily - 7 x weekly - 2 sets - 10 reps - Seated Scapular Retraction  - 1 x daily - 7 x weekly - 2 sets - 10 reps - Seated Thoracic Extension AROM  - 1 x daily - 7 x weekly - 1 sets - 10 reps  ASSESSMENT:  CLINICAL IMPRESSION: Patient is a 83 y.o. female who was seen today for physical therapy treatment for chronic shoulder pain and arthritis. Pt with reduced pain, only pain with certain mvmts but states she is very weak and fatigues easy. Treatment focused on scapular and posterior shoulder strengthening. Improving thoracic mobility but still has very limited shoulder ER affecting shoulder abd. Continue to Educate pt today on how thoracic mobility/posture effects shoulder mobility -- pt able to verbalize understanding. PROM RT shld with good ROM compared to act ex weakness limits ROM  OBJECTIVE IMPAIRMENTS: decreased mobility, decreased ROM, decreased strength, impaired UE functional use, and pain.     GOALS: Goals reviewed with patient? Yes  SHORT TERM GOALS: Target date: 08/09/23  Patient will be independent with initial HEP.  Baseline: given 06/28/23  Goal status: MET   LONG TERM GOALS: Target date: 09/20/23  Patient will be independent with advanced/ongoing HEP to improve outcomes and carryover.  Baseline:  Goal status: INITIAL  2.  Patient will report 75% improvement in R shoulder pain to improve QOL.  Baseline: 7/10 Goal status: INITIAL  3.  Patient to improve R shoulder AROM to [facility] without pain provocation to allow for increased ease of ADLs.  Baseline: see chart Goal status: INITIAL  4.  Patient will demonstrate improved functional UE strength as demonstrated by 4/5 or better. Baseline: see chart Goal status: INITIAL  5.  Patient will be able to put a  shirt on over her head.  Baseline: stopped trying Goal status: INITIAL    PLAN:  PT FREQUENCY: 2x/week  PT DURATION: 12 weeks  PLANNED INTERVENTIONS: 97110-Therapeutic exercises, 97530- Therapeutic activity, 97112- Neuromuscular re-education, 97535- Self Care, [id number]- Manual therapy, Patient/Family education, Taping, Dry Needling, Joint mobilization, Spinal mobilization, Cryotherapy, and Moist heat  PLAN FOR NEXT SESSION: 10th visit assessment Stretch bicep/anterior shoulder, thoracic extension/mobility, strengthen tricep and shoulder extensors/posterior shoulder; shoulder AAROM, PROM, light strengthening as tolerated    Jazmene Racz,ANGIE, PTA 09/13/2023, 2:03 PM [facility] at [facility] [address] [facility] [place], [place], [address] Phone: [phone]   Fax:  [phone]  Patient Details  Name: Lua Feng MRN: [id number] Date of Birth: [birth year] Referring Provider:  Cristopher Bottcher, NP  Encounter Date: 09/13/2023   MARSH SNIFF, PTA 09/13/2023, 2:03 PM  [place] [facility] at [facility] [address] [facility] [place], [place], [address] Phone: [phone]   Fax:  [phone]

## 2023-09-15 ENCOUNTER — Ambulatory Visit: Payer: Medicare PPO | Admitting: Physical Therapy

## 2023-09-15 DIAGNOSIS — G8929 Other chronic pain: Secondary | ICD-10-CM

## 2023-09-15 DIAGNOSIS — M25611 Stiffness of right shoulder, not elsewhere classified: Secondary | ICD-10-CM | POA: Diagnosis not present

## 2023-09-15 DIAGNOSIS — M6281 Muscle weakness (generalized): Secondary | ICD-10-CM

## 2023-09-15 DIAGNOSIS — M25511 Pain in right shoulder: Secondary | ICD-10-CM | POA: Diagnosis not present

## 2023-09-15 DIAGNOSIS — M25512 Pain in left shoulder: Secondary | ICD-10-CM | POA: Diagnosis not present

## 2023-09-15 NOTE — Therapy (Signed)
 OUTPATIENT PHYSICAL THERAPY SHOULDER TREATMENT Progress Note Reporting Period 06/28/23 to 09/15/23  See note below for Objective Data and Assessment of Progress/Goals.      Patient Name: Christy [name] MRN: [id number] DOB:1940/10/25, 83 y.o., female Today's Date: 09/15/2023  END OF SESSION:  PT End of Session - 09/15/23 1402     Visit Number 10    Date for PT Re-Evaluation 09/20/23    Authorization Type [employer]    PT Start Time 1400    PT Stop Time 1445    PT Time Calculation (min) 45 min             Past Medical History:  Diagnosis Date   Agatston coronary artery calcium  score greater than 400    Ca score 1121 in July 2022   Allergy    Arthritis    Chronic constipation    Depression    Diabetes mellitus without complication (HCC)    History of breast cancer    HLD (hyperlipidemia)    PAT (paroxysmal atrial tachycardia) (HCC)    [place] 02/2021   Pulmonary insufficiency    moderate by echo 11/23   Rheumatoid arthritis (HCC)    Past Surgical History:  Procedure Laterality Date   ABDOMINAL HYSTERECTOMY     ACHILLES TENDON REPAIR     BREAST REDUCTION SURGERY Left    CATARACT EXTRACTION     masectomy Right    TONSILLECTOMY AND ADENOIDECTOMY     Patient Active Problem List   Diagnosis Date Noted   Lip swelling 09/28/2022   Cognitive decline 09/02/2022   Insomnia 09/02/2022   Hearing loss 09/02/2022   Acute pain of left knee 05/10/2022   Rheumatoid arthritis of right foot (HCC) 03/31/2022   Type 2 diabetes mellitus with diabetic neuropathy, without long-term current use of insulin  (HCC) 03/31/2022   Anxiety and depression 03/31/2022   Diarrhea of presumed infectious origin 03/31/2022   History of breast cancer 03/31/2022   Pulmonary insufficiency 05/13/2021   PAT (paroxysmal atrial tachycardia) (HCC) 04/14/2021   Agatston coronary artery calcium  score greater than 400 04/14/2021   HLD (hyperlipidemia) 04/14/2021    PCP: Lyle Bertrand  REFERRING  PROVIDER: Lonell Sprang  REFERRING DIAG: chronic bilateral shoulder pain, right shoulder arthritis   THERAPY DIAG:  Chronic right shoulder pain  Stiffness of right shoulder, not elsewhere classified  Muscle weakness (generalized)  Rationale for Evaluation and Treatment: Rehabilitation  ONSET DATE: six months  SUBJECTIVE:                                                                                                                                                                                      SUBJECTIVE  STATEMENT:  Doing well, I was not sore after last session   Hand dominance: Right  PERTINENT HISTORY: Christy [name] is a pleasant 83 y.o. female who presents today for bilateral, right greater than left shoulder pain. Right shoulder is the one that has been giving her issues for many months, left 1 only bothers her very intermittently and is not as bothersome. She did receive decent relief from subacromial joint injection but only lasted for a week or 2 and then her pain returned. She would like to start formalized physical therapy. She is on methotrexate 2.5 mg daily for her rheumatoid arthritis. Has used Tylenol  in the past but no consistent pain medication.   Pertinent ROS were reviewed with the patient and found to be negative unless otherwise specified above in HPI.   PAIN:  Are you having pain? Yes: NPRS scale: 1/10 Pain location: R shoulder and into elbow/forearm  Pain description: sharp pain especially if I move the wrong way, worse in the mornings  Aggravating factors: reaching overhead, cooking sometimes especially if having to reach in the cupboards Relieving factors: ice packs  PRECAUTIONS: None  RED FLAGS: None   WEIGHT BEARING RESTRICTIONS: No  FALLS:  Has patient fallen in last 6 months? No  LIVING ENVIRONMENT: Lives with: lives with their family Lives in: House/apartment Stairs: No   PLOF: Independent and Independent with basic ADLs  PATIENT  GOALS:to be more normal and have normal movements, not to be concerned with movements hurting  NEXT MD VISIT:   OBJECTIVE:  Note: Objective measures were completed at Evaluation unless otherwise noted.  DIAGNOSTIC FINDINGS:  X-rays show mild to moderate glenohumeral joint space narrowing with  arthritic change.  There is a slightly higher pain indicative of rotator cuff arthropathy.  Mild AC joint arthropathy.  No acute fracture or bony abnormality noted   POSTURE: Rounded shoulders   UPPER EXTREMITY ROM:   Active ROM Right eval Left eval RT standing 09/14/23  Shoulder flexion 115 w/pain 160 w/pain 150  Shoulder extension     Shoulder abduction 80 w/pain [facility] 132   Shoulder adduction     Shoulder internal rotation 55 w/pain WFL 70  Shoulder external rotation 50 w/pain WFL 80  Elbow flexion     Elbow extension     Wrist flexion     Wrist extension     Wrist ulnar deviation     Wrist radial deviation     Wrist pronation     Wrist supination     (Blank rows = not tested)  UPPER EXTREMITY MMT:  MMT Right eval Left eval RT/Left 09/14/23   Shoulder flexion 2- 3- 3+/4-  Shoulder extension     Shoulder abduction 2- 3- 4-  Shoulder adduction     Shoulder internal rotation 3+ 3+ 4  Shoulder external rotation 3+ 3+  4  Middle trapezius     Lower trapezius     Elbow flexion     Elbow extension     Wrist flexion     Wrist extension     Wrist ulnar deviation     Wrist radial deviation     Wrist pronation     Wrist supination     Grip strength (lbs)     (Blank rows = not tested)  SHOULDER SPECIAL TESTS: Impingement tests: Neer impingement test: positive , Hawkins/Kennedy impingement test: positive , and Painful arc test: positive  Rotator cuff assessment: Empty can test: positive   JOINT MOBILITY TESTING:  Increase muscle  guarding and pain with PROM   PALPATION:  Some TTP    TODAY'S TREATMENT:                                                                                                                                          DATE:  09/14/23 Assessed ROM/MMT and goals for 10th visit progress note UBE L 2 2 min 2 min backward Seated row 15# 8x with cuing to relax UT. Decreased 10# 8x to increase ROM Lat Pull Down 15# 2 sets 8, assist for pull down-good stretch OH Seated chest press 5# 2 sets 8 Cabinet reaching 2# 10 x flex and abd Yellow tband scap stab ex-various postions Thor ext standing with 4# -tactile cuing needed PROM RT shld esp end range    09/13/23 UBE L2 min fwd/2 min back 5# shld ext 10 x with assist 5# shld row 10 x with assist 3# chest press 10 x 1 # shld chest press into OH 2 sets 5 standing Postural wall angles 10 x 1# shld abd 3 sets 5 Yellow tband scap stab ex-various postions Thor ext with tband Mid row yellow TB 2x10 High row yellow TB 2x10 Shoulder ER AROM 10 x then yellow tband IR and ER 10 x each PROM RT shld with good ROM compared to act ex weakness limits ROM   09/08/23 Sitting  Thoracic extension against ball x10  Scap squeeze with PT overpressure 2x10  Shoulder horizontal abd low yellow TB 2x10  Mid row yellow TB 2x10  High row yellow TB 2x10  Shoulder ER AROM 2x10  Shoulder ER at 60 deg of scaption 2x10 AAROM  Thoracic extension with hands behind head x10  Shoulder flexion AROM 2x5 while maintaining thoracic extension  Shoulder scaption AROM x5 while maintaining thoracic extension   09/06/23 Sitting  Thoracic extension against ball 3x20 Thoracic ext + rotation x10  Scap squeeze with PT overpressure 2x10  Bicep stretch x30  Tricep iso holds to PT resistance 2x10  Shoulder and elbow ext reactive iso red TB 2x10  Forearm pron/sup with elbow bent at 90 deg x10  Low Row red TB 2x10  Mid row red TB x10  08/25/23 Arm bike Level 1 for 6 min, only forwards (painful with backwards)  Wall slides, flexion 2x10 abduction 1x10  Row 2x10 yellow band Extension yellow band 2x10  Seated IR yellow band 2x10   Seated ER yellow band  2x10  Ice 10 minutes to right shoulder   08/23/23 Pball roll out forward x10 Pball CW and CCW roll x10 Sitting  Thoracic extension against ball 3x20  Scap squeeze with PT overpressure 2x10  Shoulder ext iso holds with PT pulling yellow TB x10  Shoulder ER and IR iso holds with PT pulling yellow TB 2x10  Row with PT pulling yellow TB 2x10  Shoulder ER with arm in scaption ~30 deg x10 AAROM  Standing  Pendulums fwd/bwd, side to side x10 each  Wall pushup plus x10 Manual therapy:  Scapular mobilization Manual pec stretch  08/18/23 Attempted UBE but pt found it too painful today Pball roll out forward and side to side x10 each Pball roll CW & CCW x10 Pendulums fwd/bwd, side to side x10 Bicep stretch against doorway x30, attempted pec stretch but too much for pt Scap squeeze + pec overpressure for stretching x20 Scapular depression against chair x20 Performed sitting ER AROM but pt reporting increased pain after performing x10 Cervical retraction x20 UT stretch x30 R&L Standing ER red TB 2x10 Standing IR red TB 2x10 Thoracic extension against chair x10 Seated Row red TB x10 Scap retraction x10 Manual therapy:  Scapular mobilization Manual pec stretch Posterior joint mob grade II to III Inferior joint mob grade II to III  07/21/23 UBE L1 x35mins each way  Pec stretch in doorway Yellow band ER/IR 2x10 1# bicep curl 2x10 1# shoulder flexion and abduction 2x10 PROM in all directions MET to increase shoulder IR  Supine flexion AAROM with dowel x10 Supine abduction AAROM with dowel x10  Sidelying ER 1# 2x10   07/13/23 UBE L1 x3 mins each way  Wall slides 10x flexion and abd  Rows and ext red band 2x10 Standing AAROM flexion with 2# WaTE 2x10 Bicep curls with 2# WaTE 2x10 PROM in all directions and pec stretching  Ice pack to R shoulder for    EVAL 06/28/23   PATIENT EDUCATION: Education details: POC and HEP Person educated:  Patient Education method: Explanation Education comprehension: verbalized understanding  HOME EXERCISE PROGRAM: Access Code: [facility] URL: [email or web address] Date: 08/18/2023 Prepared by: Gellen April Earnie Starring  Exercises - Standing Shoulder Extension with Dowel  - 1 x daily - 7 x weekly - 2 sets - 10 reps - Standing Bilateral Shoulder Internal Rotation AAROM with Dowel  - 1 x daily - 7 x weekly - 2 sets - 10 reps - Shoulder Internal Rotation with Resistance  - 1 x daily - 7 x weekly - 2 sets - 10 reps - Shoulder External Rotation with Anchored Resistance  - 1 x daily - 7 x weekly - 2 sets - 10 reps - Doorway Pec Stretch at 60 Degrees Abduction with Arm Straight  - 1 x daily - 7 x weekly - 2 sets - 30 sec hold - Seated Bilateral Shoulder Flexion Towel Slide at Table Top  - 1 x daily - 7 x weekly - 2 sets - 10 reps - Seated Scapular Retraction  - 1 x daily - 7 x weekly - 2 sets - 10 reps - Seated Thoracic Extension AROM  - 1 x daily - 7 x weekly - 1 sets - 10 reps  ASSESSMENT:  CLINICAL IMPRESSION: Patient is a 83 y.o. female who was seen today for physical therapy treatment for chronic shoulder pain and arthritis. Pt with reduced pain, only pain with certain mvmts but states she is very weak and fatigues easy. AROM and MET checked and goals assessed and she is making good progress. PROM RT shld with good ROM compared to act ex weakness limits ROM Pt is very pleased with progress, 75% less pain and increased ROM but still some func weakness and struggles with putting top over head. Pt will continue to benefit from skilled interventions to build strength and resume func activities.  OBJECTIVE IMPAIRMENTS: decreased mobility, decreased ROM, decreased strength, impaired UE functional use, and pain.     GOALS:  Goals reviewed with patient? Yes  SHORT TERM GOALS: Target date: 08/09/23  Patient will be independent with initial HEP.  Baseline: given 06/28/23  Goal  status: MET   LONG TERM GOALS: Target date: 09/20/23  Patient will be independent with advanced/ongoing HEP to improve outcomes and carryover.  Baseline:  Goal status: progressing 09/14/23  2.  Patient will report 75% improvement in R shoulder pain to improve QOL.  Baseline: 7/10 Goal status: 09/14/23 MET 75%  3.  Patient to improve R shoulder AROM to [facility] without pain provocation to allow for increased ease of ADLs.  Baseline: see chart Goal status: progressing 09/14/23  4.  Patient will demonstrate improved functional UE strength as demonstrated by 4/5 or better. Baseline: see chart Goal status: progressing 09/14/23  5.  Patient will be able to put a shirt on over her head.  Baseline: stopped trying Goal status: progressing 09/14/23 still difficult    PLAN:  PT FREQUENCY: 2x/week  PT DURATION: 12 weeks  PLANNED INTERVENTIONS: 97110-Therapeutic exercises, 97530- Therapeutic activity, 97112- Neuromuscular re-education, 97535- Self Care, [id number]- Manual therapy, Patient/Family education, Taping, Dry Needling, Joint mobilization, Spinal mobilization, Cryotherapy, and Moist heat  PLAN FOR NEXT SESSION: Progress strength and func as she fatigues easily and fatigues quickly once over 90 degrees  Olen Eaves,ANGIE, PTA 09/15/2023, 2:37 PM [facility] [address] [facility] [place], [place], [address] Phone: [phone]   Fax:  [phone]  Patient Details  Name: Christy [name] MRN: [id number] Date of Birth: May 18, 1941 Referring Provider:  Cristopher Bottcher, NP  Encounter Date: 09/15/2023

## 2023-09-20 ENCOUNTER — Ambulatory Visit: Payer: Medicare PPO | Admitting: Physical Therapy

## 2023-09-20 DIAGNOSIS — M25611 Stiffness of right shoulder, not elsewhere classified: Secondary | ICD-10-CM | POA: Diagnosis not present

## 2023-09-20 DIAGNOSIS — G8929 Other chronic pain: Secondary | ICD-10-CM | POA: Diagnosis not present

## 2023-09-20 DIAGNOSIS — M25511 Pain in right shoulder: Secondary | ICD-10-CM | POA: Diagnosis not present

## 2023-09-20 DIAGNOSIS — M25512 Pain in left shoulder: Secondary | ICD-10-CM | POA: Diagnosis not present

## 2023-09-20 DIAGNOSIS — M6281 Muscle weakness (generalized): Secondary | ICD-10-CM | POA: Diagnosis not present

## 2023-09-20 NOTE — Therapy (Signed)
OUTPATIENT PHYSICAL THERAPY SHOULDER TREATMENT    Patient Name: Christy [name] MRN: [id number] DOB:[birth year], 83 y.o., female Today's Date: 09/20/2023  END OF SESSION:  PT End of Session - 09/20/23 1404     Visit Number 11    Date for PT Re-Evaluation 09/20/23    Authorization Type [employer]    PT Start Time 1400    PT Stop Time 1445    PT Time Calculation (min) 45 min             Past Medical History:  Diagnosis Date   Agatston coronary artery calcium score greater than 400    Ca score 1121 in July 2022   Allergy    Arthritis    Chronic constipation    Depression    Diabetes mellitus without complication (HCC)    History of breast cancer    HLD (hyperlipidemia)    PAT (paroxysmal atrial tachycardia) (HCC)    [place] 02/2021   Pulmonary insufficiency    moderate by echo 11/23   Rheumatoid arthritis (HCC)    Past Surgical History:  Procedure Laterality Date   ABDOMINAL HYSTERECTOMY     ACHILLES TENDON REPAIR     BREAST REDUCTION SURGERY Left    CATARACT EXTRACTION     masectomy Right    TONSILLECTOMY AND ADENOIDECTOMY     Patient Active Problem List   Diagnosis Date Noted   Lip swelling 09/28/2022   Cognitive decline 09/02/2022   Insomnia 09/02/2022   Hearing loss 09/02/2022   Acute pain of left knee 05/10/2022   Rheumatoid arthritis of right foot (HCC) 03/31/2022   Type 2 diabetes mellitus with diabetic neuropathy, without long-term current use of insulin (HCC) 03/31/2022   Anxiety and depression 03/31/2022   Diarrhea of presumed infectious origin 03/31/2022   History of breast cancer 03/31/2022   Pulmonary insufficiency 05/13/2021   PAT (paroxysmal atrial tachycardia) (HCC) 04/14/2021   Agatston coronary artery calcium score greater than 400 04/14/2021   HLD (hyperlipidemia) 04/14/2021    PCP: Carilyn Goodpasture  REFERRING PROVIDER: Madelyn Brunner  REFERRING DIAG: chronic bilateral shoulder pain, right shoulder arthritis   THERAPY DIAG:  Chronic right  shoulder pain  Stiffness of right shoulder, not elsewhere classified  Muscle weakness (generalized)  Chronic left shoulder pain  Rationale for Evaluation and Treatment: Rehabilitation  ONSET DATE: "six months"  SUBJECTIVE:                                                                                                                                                                                      SUBJECTIVE STATEMENT:  Doing well, I was not sore after last session. Therapy is really helping.  I can use arm for a lot more.   Hand dominance: Right  PERTINENT HISTORY: Christy [name] is a pleasant 83 y.o. female who presents today for bilateral, right greater than left shoulder pain. Right shoulder is the one that has been giving her issues for many months, left 1 only bothers her very intermittently and is not as bothersome. She did receive decent relief from subacromial joint injection but only lasted for a week or 2 and then her pain returned. She would like to start formalized physical therapy. She is on methotrexate 2.5 mg daily for her rheumatoid arthritis. Has used Tylenol in the past but no consistent pain medication.   Pertinent ROS were reviewed with the patient and found to be negative unless otherwise specified above in HPI.   PAIN:  Are you having pain? Yes: NPRS scale: 1/10 Pain location: R shoulder and into elbow/forearm  Pain description: sharp pain especially if I move the wrong way, worse in the mornings  Aggravating factors: reaching overhead, cooking sometimes especially if having to reach in the cupboards Relieving factors: ice packs  PRECAUTIONS: None  RED FLAGS: None   WEIGHT BEARING RESTRICTIONS: No  FALLS:  Has patient fallen in last 6 months? No  LIVING ENVIRONMENT: Lives with: lives with their family Lives in: House/apartment Stairs: No   PLOF: Independent and Independent with basic ADLs  PATIENT GOALS:to be more normal and have normal  movements, not to be concerned with movements hurting  NEXT MD VISIT:   OBJECTIVE:  Note: Objective measures were completed at Evaluation unless otherwise noted.  DIAGNOSTIC FINDINGS:  X-rays show mild to moderate glenohumeral joint space narrowing with  arthritic change.  There is a slightly higher pain indicative of rotator cuff arthropathy.  Mild AC joint arthropathy.  No acute fracture or bony abnormality noted   POSTURE: Rounded shoulders   UPPER EXTREMITY ROM:   Active ROM Right eval Left eval RT standing 09/14/23  Shoulder flexion 115 w/pain 160 w/pain 150  Shoulder extension     Shoulder abduction 80 w/pain [facility] 132   Shoulder adduction     Shoulder internal rotation 55 w/pain WFL 70  Shoulder external rotation 50 w/pain WFL 80  Elbow flexion     Elbow extension     Wrist flexion     Wrist extension     Wrist ulnar deviation     Wrist radial deviation     Wrist pronation     Wrist supination     (Blank rows = not tested)  UPPER EXTREMITY MMT:  MMT Right eval Left eval RT/Left 09/14/23   Shoulder flexion 2- 3- 3+/4-  Shoulder extension     Shoulder abduction 2- 3- 4-  Shoulder adduction     Shoulder internal rotation 3+ 3+ 4  Shoulder external rotation 3+ 3+  4  Middle trapezius     Lower trapezius     Elbow flexion     Elbow extension     Wrist flexion     Wrist extension     Wrist ulnar deviation     Wrist radial deviation     Wrist pronation     Wrist supination     Grip strength (lbs)     (Blank rows = not tested)  SHOULDER SPECIAL TESTS: Impingement tests: Neer impingement test: positive , Hawkins/Kennedy impingement test: positive , and Painful arc test: positive  Rotator cuff assessment: Empty can test: positive   JOINT MOBILITY TESTING:  Increase muscle guarding and pain  with PROM   PALPATION:  Some TTP    TODAY'S TREATMENT:                                                                                                                                          DATE:   09/20/23 UBE 3 min fwd/3 min backward Assessed ROM/MMT and goals for 10th visit progress note last session so did not measure again today- will use same values to ask for additional visits Seated row 15# 10x 2 sets with cuing to relax UT.  Lat Pull Down 15# 2 sets 10, assist for pull down-good stretch OH Seated chest press 5# 2 sets 10 2# func reaching various angles 10 x 3 sets  Facing wall various vector slides 5# cane flexion with PTA assist, pt can get to 90 then PTA pushes to about 160 then pt eccentric lowers 10 x Seated OH 1# 2 sets 8 PROM RT shld with func on end range stretch/hold  09/14/23 Assessed ROM/MMT and goals for 10th visit progress note UBE L 2 2 min 2 min backward Seated row 15# 8x with cuing to relax UT. Decreased 10# 8x to increase ROM Lat Pull Down 15# 2 sets 8, assist for pull down-good stretch OH Seated chest press 5# 2 sets 8 Cabinet reaching 2# 10 x flex and abd Yellow tband scap stab ex-various postions Thor ext standing with 4# -tactile cuing needed PROM RT shld esp end range    09/13/23 UBE L2 min fwd/2 min back 5# shld ext 10 x with assist 5# shld row 10 x with assist 3# chest press 10 x 1 # shld chest press into OH 2 sets 5 standing Postural wall angles 10 x 1# shld abd 3 sets 5 Yellow tband scap stab ex-various postions Thor ext with tband Mid row yellow TB 2x10 High row yellow TB 2x10 Shoulder ER AROM 10 x then yellow tband IR and ER 10 x each PROM RT shld with good ROM compared to act ex weakness limits ROM   09/08/23 Sitting  Thoracic extension against ball x10  Scap squeeze with PT overpressure 2x10  Shoulder horizontal abd low yellow TB 2x10  Mid row yellow TB 2x10  High row yellow TB 2x10  Shoulder ER AROM 2x10  Shoulder ER at 60 deg of scaption 2x10 AAROM  Thoracic extension with hands behind head x10  Shoulder flexion AROM 2x5 while maintaining thoracic extension  Shoulder scaption AROM x5 while  maintaining thoracic extension   09/06/23 Sitting  Thoracic extension against ball 3x20" Thoracic ext + rotation x10  Scap squeeze with PT overpressure 2x10  Bicep stretch x30"  Tricep iso holds to PT resistance 2x10  Shoulder and elbow ext reactive iso red TB 2x10  Forearm pron/sup with elbow bent at 90 deg x10  Low Row red TB 2x10  Mid row red TB x10  08/25/23 Arm bike  Level 1 for 6 min, only forwards (painful with backwards)  Wall slides, flexion 2x10 abduction 1x10  Row 2x10 yellow band Extension yellow band 2x10  Seated IR yellow band 2x10  Seated ER yellow band  2x10  Ice 10 minutes to right shoulder   08/23/23 Pball roll out forward x10 Pball CW and CCW roll x10 Sitting  Thoracic extension against ball 3x20"  Scap squeeze with PT overpressure 2x10  Shoulder ext iso holds with PT pulling yellow TB x10  Shoulder ER and IR iso holds with PT pulling yellow TB 2x10  Row with PT pulling yellow TB 2x10  Shoulder ER with arm in scaption ~30 deg x10 AAROM Standing  Pendulums fwd/bwd, side to side x10 each  Wall pushup plus x10 Manual therapy:  Scapular mobilization Manual pec stretch  08/18/23 Attempted UBE but pt found it too painful today Pball roll out forward and side to side x10 each Pball roll CW & CCW x10 Pendulums fwd/bwd, side to side x10 Bicep stretch against doorway x30", attempted pec stretch but too much for pt Scap squeeze + pec overpressure for stretching x20 Scapular depression against chair x20 Performed sitting ER AROM but pt reporting increased pain after performing x10 Cervical retraction x20 UT stretch x30" R&L Standing ER red TB 2x10 Standing IR red TB 2x10 Thoracic extension against chair x10 Seated Row red TB x10 Scap retraction x10 Manual therapy:  Scapular mobilization Manual pec stretch Posterior joint mob grade II to III Inferior joint mob grade II to III  07/21/23 UBE L1 x41mins each way  Pec stretch in doorway Yellow band ER/IR  2x10 1# bicep curl 2x10 1# shoulder flexion and abduction 2x10 PROM in all directions MET to increase shoulder IR  Supine flexion AAROM with dowel x10 Supine abduction AAROM with dowel x10  Sidelying ER 1# 2x10   07/13/23 UBE L1 x3 mins each way  Wall slides 10x flexion and abd  Rows and ext red band 2x10 Standing AAROM flexion with 2# WaTE 2x10 Bicep curls with 2# WaTE 2x10 PROM in all directions and pec stretching  Ice pack to R shoulder for    EVAL 06/28/23   PATIENT EDUCATION: Education details: POC and HEP Person educated: Patient Education method: Explanation Education comprehension: verbalized understanding  HOME EXERCISE PROGRAM: Access Code: [facility] URL: [email or web address] Date: 08/18/2023 Prepared by: Vernon Prey April Kirstie Peri  Exercises - Standing Shoulder Extension with Dowel  - 1 x daily - 7 x weekly - 2 sets - 10 reps - Standing Bilateral Shoulder Internal Rotation AAROM with Dowel  - 1 x daily - 7 x weekly - 2 sets - 10 reps - Shoulder Internal Rotation with Resistance  - 1 x daily - 7 x weekly - 2 sets - 10 reps - Shoulder External Rotation with Anchored Resistance  - 1 x daily - 7 x weekly - 2 sets - 10 reps - Doorway Pec Stretch at 60 Degrees Abduction with Arm Straight  - 1 x daily - 7 x weekly - 2 sets - 30 sec hold - Seated Bilateral Shoulder Flexion Towel Slide at Table Top  - 1 x daily - 7 x weekly - 2 sets - 10 reps - Seated Scapular Retraction  - 1 x daily - 7 x weekly - 2 sets - 10 reps - Seated Thoracic Extension AROM  - 1 x daily - 7 x weekly - 1 sets - 10 reps  ASSESSMENT:  CLINICAL IMPRESSION: Patient is a 83 y.o. female  who was seen today for physical therapy treatment for chronic shoulder pain and arthritis. Pt with reduced pain, only pain with certain mvmts but states she is very weak and fatigues easy. AROM and MET checked and goals assessed and she is making good progress. PROM RT shld with good ROM compared to  act ex weakness limits ROM Pt is very pleased with progress, 75% less pain and increased ROM but still some func weakness and struggles with putting top over head. Pt will continue to benefit from skilled interventions to build strength and resume func activities.  OBJECTIVE IMPAIRMENTS: decreased mobility, decreased ROM, decreased strength, impaired UE functional use, and pain.     GOALS: Goals reviewed with patient? Yes  SHORT TERM GOALS: Target date: 08/09/23  Patient will be independent with initial HEP.  Baseline: given 06/28/23  Goal status: MET   LONG TERM GOALS: Target date: 09/20/23  Patient will be independent with advanced/ongoing HEP to improve outcomes and carryover.  Baseline:  Goal status: progressing 09/14/23  evolving 09/20/23  2.  Patient will report 75% improvement in R shoulder pain to improve QOL.  Baseline: 7/10 Goal status: 09/14/23 MET 75%  3.  Patient to improve R shoulder AROM to [facility] without pain provocation to allow for increased ease of ADLs.  Baseline: see chart Goal status: progressing 09/14/23 and 09/20/23  4.  Patient will demonstrate improved functional UE strength as demonstrated by 4/5 or better. Baseline: see chart Goal status: progressing 09/14/23 and 09/20/23  5.  Patient will be able to put a shirt on over her head.  Baseline: stopped trying Goal status: progressing 09/14/23 still difficult    PLAN:  PT FREQUENCY: 2x/week  PT DURATION: 12 weeks  PLANNED INTERVENTIONS: 97110-Therapeutic exercises, 97530- Therapeutic activity, 97112- Neuromuscular re-education, 97535- Self Care, 56213- Manual therapy, Patient/Family education, Taping, Dry Needling, Joint mobilization, Spinal mobilization, Cryotherapy, and Moist heat  PLAN FOR NEXT SESSION: Progress strength and func as she fatigues easily and fatigues quickly once over 90 degrees  Tegan Burnside,ANGIE, PTA 09/20/2023, 2:05 PM [place] [facility] at [facility] [address]  [facility]. [place], [place], [address] Phone: [phone]   Fax:  [phone]  Patient Details  Name: Christy [name] MRN: [id number] Date of Birth: 1941/07/28 Referring Provider:  Carilyn Goodpasture, NP  Encounter Date: 09/20/2023  [facility] at [facility] [address] [place], [place], [address] Phone: [phone]   Fax:  [phone]  Patient Details  Name: Christy [name] MRN: [id number] Date of Birth: May 06, 1941 Referring Provider:  Carilyn Goodpasture, NP  Encounter Date: 09/20/2023   Suanne Marker, PTA 09/20/2023, 2:05 PM  [place] [facility] at [facility] [address] [facility] [place], [place], [address] Phone: [phone]   Fax:  [phone]

## 2023-09-22 ENCOUNTER — Ambulatory Visit: Payer: Medicare PPO | Admitting: Physical Therapy

## 2023-09-27 ENCOUNTER — Ambulatory Visit: Payer: Medicare PPO | Admitting: Physical Therapy

## 2023-09-27 DIAGNOSIS — G8929 Other chronic pain: Secondary | ICD-10-CM

## 2023-09-27 DIAGNOSIS — M6281 Muscle weakness (generalized): Secondary | ICD-10-CM | POA: Diagnosis not present

## 2023-09-27 DIAGNOSIS — M25511 Pain in right shoulder: Secondary | ICD-10-CM | POA: Diagnosis not present

## 2023-09-27 DIAGNOSIS — M25512 Pain in left shoulder: Secondary | ICD-10-CM | POA: Diagnosis not present

## 2023-09-27 DIAGNOSIS — M25611 Stiffness of right shoulder, not elsewhere classified: Secondary | ICD-10-CM | POA: Diagnosis not present

## 2023-09-27 NOTE — Therapy (Signed)
OUTPATIENT PHYSICAL THERAPY SHOULDER TREATMENT    Patient Name: Christy [name] MRN: [id number] DOB:08/22/1940, 83 y.o., female Today's Date: 09/27/2023  END OF SESSION:  PT End of Session - 09/27/23 1402     Visit Number 12    Date for PT Re-Evaluation 09/20/23    Authorization Type [employer]    PT Start Time 1400    PT Stop Time 1445    PT Time Calculation (min) 45 min             Past Medical History:  Diagnosis Date   Agatston coronary artery calcium score greater than 400    Ca score 1121 in July 2022   Allergy    Arthritis    Chronic constipation    Depression    Diabetes mellitus without complication (HCC)    History of breast cancer    HLD (hyperlipidemia)    PAT (paroxysmal atrial tachycardia) (HCC)    [place]iop[place] 02/2021   Pulmonary insufficiency    moderate by echo 11/23   Rheumatoid arthritis (HCC)    Past Surgical History:  Procedure Laterality Date   ABDOMINAL HYSTERECTOMY     ACHILLES TENDON REPAIR     BREAST REDUCTION SURGERY Left    CATARACT EXTRACTION     masectomy Right    TONSILLECTOMY AND ADENOIDECTOMY     Patient Active Problem List   Diagnosis Date Noted   Lip swelling 09/28/2022   Cognitive decline 09/02/2022   Insomnia 09/02/2022   Hearing loss 09/02/2022   Acute pain of left knee 05/10/2022   Rheumatoid arthritis of right foot (HCC) 03/31/2022   Type 2 diabetes mellitus with diabetic neuropathy, without long-term current use of insulin (HCC) 03/31/2022   Anxiety and depression 03/31/2022   Diarrhea of presumed infectious origin 03/31/2022   History of breast cancer 03/31/2022   Pulmonary insufficiency 05/13/2021   PAT (paroxysmal atrial tachycardia) (HCC) 04/14/2021   Agatston coronary artery calcium score greater than 400 04/14/2021   HLD (hyperlipidemia) 04/14/2021    PCP: Christy [name]  REFERRING PROVIDER: Madelyn [name]  REFERRING DIAG: chronic bilateral shoulder pain, right shoulder arthritis   THERAPY DIAG:  Chronic right  shoulder pain  Stiffness of right shoulder, not elsewhere classified  Muscle weakness (generalized)  Rationale for Evaluation and Treatment: Rehabilitation  ONSET DATE: "six months"  SUBJECTIVE:                                                                                                                                                                                      SUBJECTIVE STATEMENT:  Got sick last week but doing better. Shld is okay   Hand dominance: Right  PERTINENT HISTORY:  Christy [name] is a pleasant 83 y.o. female who presents today for bilateral, right greater than left shoulder pain. Right shoulder is the one that has been giving her issues for many months, left 1 only bothers her very intermittently and is not as bothersome. She did receive decent relief from subacromial joint injection but only lasted for a week or 2 and then her pain returned. She would like to start formalized physical therapy. She is on methotrexate 2.5 mg daily for her rheumatoid arthritis. Has used Tylenol in the past but no consistent pain medication.   Pertinent ROS were reviewed with the patient and found to be negative unless otherwise specified above in HPI.   PAIN:  Are you having pain? Yes: NPRS scale: 1/10 Pain location: R shoulder and into elbow/forearm  Pain description: sharp pain especially if I move the wrong way, worse in the mornings  Aggravating factors: reaching overhead, cooking sometimes especially if having to reach in the cupboards Relieving factors: ice packs  PRECAUTIONS: None  RED FLAGS: None   WEIGHT BEARING RESTRICTIONS: No  FALLS:  Has patient fallen in last 6 months? No  LIVING ENVIRONMENT: Lives with: lives with their family Lives in: House/apartment Stairs: No   PLOF: Independent and Independent with basic ADLs  PATIENT GOALS:to be more normal and have normal movements, not to be concerned with movements hurting  NEXT MD VISIT:   OBJECTIVE:   Note: Objective measures were completed at Evaluation unless otherwise noted.  DIAGNOSTIC FINDINGS:  X-rays show mild to moderate glenohumeral joint space narrowing with  arthritic change.  There is a slightly higher pain indicative of rotator cuff arthropathy.  Mild AC joint arthropathy.  No acute fracture or bony abnormality noted   POSTURE: Rounded shoulders   UPPER EXTREMITY ROM:   Active ROM Right eval Left eval RT standing 09/14/23 RT shld standing  Shoulder flexion 115 w/pain 160 w/pain 150 159  Shoulder extension      Shoulder abduction 80 w/pain WFL 132  142  Shoulder adduction      Shoulder internal rotation 55 w/pain WFL 70 80  Shoulder external rotation 50 w/pain WFL 80 80  Elbow flexion      Elbow extension      Wrist flexion      Wrist extension      Wrist ulnar deviation      Wrist radial deviation      Wrist pronation      Wrist supination      (Blank rows = not tested)  UPPER EXTREMITY MMT:  MMT Right eval Left eval RT/Left 09/14/23   Shoulder flexion 2- 3- 3+/4-  Shoulder extension     Shoulder abduction 2- 3- 4-  Shoulder adduction     Shoulder internal rotation 3+ 3+ 4  Shoulder external rotation 3+ 3+  4  Middle trapezius     Lower trapezius     Elbow flexion     Elbow extension     Wrist flexion     Wrist extension     Wrist ulnar deviation     Wrist radial deviation     Wrist pronation     Wrist supination     Grip strength (lbs)     (Blank rows = not tested)  SHOULDER SPECIAL TESTS: Impingement tests: Neer impingement test: positive , Hawkins/Kennedy impingement test: positive , and Painful arc test: positive  Rotator cuff assessment: Empty can test: positive   JOINT MOBILITY TESTING:  Increase muscle guarding and  pain with PROM   PALPATION:  Some TTP    TODAY'S TREATMENT:                                                                                                                                         DATE:   09/27/23 ROM  taken at start of session- improvement noted Standing Nustep RT UE only L 2 push/pull 2 min , horz ABD 2 min Seated row 15# 10x 2 sets  Lat Pull Down 15# 2 sets 10, assist for pull down-good stretch OH Seated chest press 5# 2 sets 10 Seated wt ball chest press 10 x then OH 10 x Func cabinet reaching fwd and laterally 5x each RT UE 4# to 1st shelf- difficult for pt Switched to 2 # 2 level shelves 5 x each PROM RT shld with func on end range stretch/hold   09/20/23 UBE 3 min fwd/3 min backward Assessed ROM/MMT and goals for 10th visit progress note last session so did not measure again today- will use same values to ask for additional visits Seated row 15# 10x 2 sets with cuing to relax UT.  Lat Pull Down 15# 2 sets 10, assist for pull down-good stretch OH Seated chest press 5# 2 sets 10 2# func reaching various angles 10 x 3 sets  Facing wall various vector slides 5# cane flexion with PTA assist, pt can get to 90 then PTA pushes to about 160 then pt eccentric lowers 10 x Seated OH 1# 2 sets 8 PROM RT shld with func on end range stretch/hold  09/14/23 Assessed ROM/MMT and goals for 10th visit progress note UBE L 2 2 min 2 min backward Seated row 15# 8x with cuing to relax UT. Decreased 10# 8x to increase ROM Lat Pull Down 15# 2 sets 8, assist for pull down-good stretch OH Seated chest press 5# 2 sets 8 Cabinet reaching 2# 10 x flex and abd Yellow tband scap stab ex-various postions Thor ext standing with 4# -tactile cuing needed PROM RT shld esp end range    09/13/23 UBE L2 min fwd/2 min back 5# shld ext 10 x with assist 5# shld row 10 x with assist 3# chest press 10 x 1 # shld chest press into OH 2 sets 5 standing Postural wall angles 10 x 1# shld abd 3 sets 5 Yellow tband scap stab ex-various postions Thor ext with tband Mid row yellow TB 2x10 High row yellow TB 2x10 Shoulder ER AROM 10 x then yellow tband IR and ER 10 x each PROM RT shld with good ROM compared to act ex  weakness limits ROM   09/08/23 Sitting  Thoracic extension against ball x10  Scap squeeze with PT overpressure 2x10  Shoulder horizontal abd low yellow TB 2x10  Mid row yellow TB 2x10  High row yellow TB 2x10  Shoulder ER AROM 2x10  Shoulder ER at 60  deg of scaption 2x10 AAROM  Thoracic extension with hands behind head x10  Shoulder flexion AROM 2x5 while maintaining thoracic extension  Shoulder scaption AROM x5 while maintaining thoracic extension   09/06/23 Sitting  Thoracic extension against ball 3x20" Thoracic ext + rotation x10  Scap squeeze with PT overpressure 2x10  Bicep stretch x30"  Tricep iso holds to PT resistance 2x10  Shoulder and elbow ext reactive iso red TB 2x10  Forearm pron/sup with elbow bent at 90 deg x10  Low Row red TB 2x10  Mid row red TB x10  08/25/23 Arm bike Level 1 for 6 min, only forwards (painful with backwards)  Wall slides, flexion 2x10 abduction 1x10  Row 2x10 yellow band Extension yellow band 2x10  Seated IR yellow band 2x10  Seated ER yellow band  2x10  Ice 10 minutes to right shoulder   08/23/23 Pball roll out forward x10 Pball CW and CCW roll x10 Sitting  Thoracic extension against ball 3x20"  Scap squeeze with PT overpressure 2x10  Shoulder ext iso holds with PT pulling yellow TB x10  Shoulder ER and IR iso holds with PT pulling yellow TB 2x10  Row with PT pulling yellow TB 2x10  Shoulder ER with arm in scaption ~30 deg x10 AAROM Standing  Pendulums fwd/bwd, side to side x10 each  Wall pushup plus x10 Manual therapy:  Scapular mobilization Manual pec stretch  08/18/23 Attempted UBE but pt found it too painful today Pball roll out forward and side to side x10 each Pball roll CW & CCW x10 Pendulums fwd/bwd, side to side x10 Bicep stretch against doorway x30", attempted pec stretch but too much for pt Scap squeeze + pec overpressure for stretching x20 Scapular depression against chair x20 Performed sitting ER AROM but pt  reporting increased pain after performing x10 Cervical retraction x20 UT stretch x30" R&L Standing ER red TB 2x10 Standing IR red TB 2x10 Thoracic extension against chair x10 Seated Row red TB x10 Scap retraction x10 Manual therapy:  Scapular mobilization Manual pec stretch Posterior joint mob grade II to III Inferior joint mob grade II to III  07/21/23 UBE L1 x25mins each way  Pec stretch in doorway Yellow band ER/IR 2x10 1# bicep curl 2x10 1# shoulder flexion and abduction 2x10 PROM in all directions MET to increase shoulder IR  Supine flexion AAROM with dowel x10 Supine abduction AAROM with dowel x10  Sidelying ER 1# 2x10   07/13/23 UBE L1 x3 mins each way  Wall slides 10x flexion and abd  Rows and ext red band 2x10 Standing AAROM flexion with 2# WaTE 2x10 Bicep curls with 2# WaTE 2x10 PROM in all directions and pec stretching  Ice pack to R shoulder for    EVAL 06/28/23   PATIENT EDUCATION: Education details: POC and HEP Person educated: Patient Education method: Explanation Education comprehension: verbalized understanding  HOME EXERCISE PROGRAM: Access Code: [facility] URL: [email or web address] Date: 08/18/2023 Prepared by: Vernon Prey April Kirstie Peri  Exercises - Standing Shoulder Extension with Dowel  - 1 x daily - 7 x weekly - 2 sets - 10 reps - Standing Bilateral Shoulder Internal Rotation AAROM with Dowel  - 1 x daily - 7 x weekly - 2 sets - 10 reps - Shoulder Internal Rotation with Resistance  - 1 x daily - 7 x weekly - 2 sets - 10 reps - Shoulder External Rotation with Anchored Resistance  - 1 x daily - 7 x weekly - 2 sets - 10 reps -  Doorway Pec Stretch at 60 Degrees Abduction with Arm Straight  - 1 x daily - 7 x weekly - 2 sets - 30 sec hold - Seated Bilateral Shoulder Flexion Towel Slide at Table Top  - 1 x daily - 7 x weekly - 2 sets - 10 reps - Seated Scapular Retraction  - 1 x daily - 7 x weekly - 2 sets - 10 reps - Seated  Thoracic Extension AROM  - 1 x daily - 7 x weekly - 1 sets - 10 reps  ASSESSMENT:  CLINICAL IMPRESSION: Progressed wt  and pt fatigued easily esp as she nears and moves past 90 degrees. Took ROM measurements at start of session and improved, weakness is chief issue. OBJECTIVE IMPAIRMENTS: decreased mobility, decreased ROM, decreased strength, impaired UE functional use, and pain.     GOALS: Goals reviewed with patient? Yes  SHORT TERM GOALS: Target date: 08/09/23  Patient will be independent with initial HEP.  Baseline: given 06/28/23  Goal status: MET   LONG TERM GOALS: Target date: 09/20/23  Patient will be independent with advanced/ongoing HEP to improve outcomes and carryover.  Baseline:  Goal status: progressing 09/14/23  evolving 09/20/23  2.  Patient will report 75% improvement in R shoulder pain to improve QOL.  Baseline: 7/10 Goal status: 09/14/23 MET 75%  3.  Patient to improve R shoulder AROM to [facility] without pain provocation to allow for increased ease of ADLs.  Baseline: see chart Goal status: progressing 09/14/23 and 09/20/23  4.  Patient will demonstrate improved functional UE strength as demonstrated by 4/5 or better. Baseline: see chart Goal status: progressing 09/14/23 and 09/20/23  5.  Patient will be able to put a shirt on over her head.  Baseline: stopped trying Goal status: progressing 09/14/23 still difficult    PLAN:  PT FREQUENCY: 2x/week  PT DURATION: 12 weeks  PLANNED INTERVENTIONS: 97110-Therapeutic exercises, 97530- Therapeutic activity, 97112- Neuromuscular re-education, 97535- Self Care, 16109- Manual therapy, Patient/Family education, Taping, Dry Needling, Joint mobilization, Spinal mobilization, Cryotherapy, and Moist heat  PLAN FOR NEXT SESSION: Progress strength and func as she fatigues easily and fatigues quickly once over 90 degrees  Jazir Newey,ANGIE, PTA 09/27/2023, 2:03 PM [facility] at [facility]  [address] [facility]. [place], [place], [address] Phone: [phone]   Fax:  [phone]  Patient Details  Name: River Mckercher MRN: [id number] Date of Birth: [birth year] Referring Provider:  Carilyn Goodpasture, NP  Encounter Date: 09/27/2023  [facility] at [facility] [address] [place], [place], [address] Phone: [phone]   Fax:  [phone]  Patient Details  Name: Jalise Zawistowski MRN: [id number] Date of Birth: 08/15/1940 Referring Provider:  Carilyn Goodpasture, NP  Encounter Date: 09/27/2023   Suanne Marker, PTA 09/27/2023, 2:03 PM  [facility] at [facility] [address] [place], [place], [address] Phone: [phone]   Fax:  [phone] at [facility] [address]. [place], [place], [address] Phone: [phone]   Fax:  [phone]  Patient Details  Name: Fareeha Evon MRN: [id number] Date of Birth: 1941-02-21 Referring Provider:  Carilyn Goodpasture, NP  Encounter Date: 09/27/2023   Suanne Marker, PTA 09/27/2023, 2:03 PM  [facility] at [facility] [address] [facility] [place], [place], [address] Phone: [phone]   Fax:  [phone]

## 2023-10-04 ENCOUNTER — Ambulatory Visit: Payer: Medicare PPO | Admitting: Physical Therapy

## 2023-10-04 DIAGNOSIS — M25611 Stiffness of right shoulder, not elsewhere classified: Secondary | ICD-10-CM

## 2023-10-04 DIAGNOSIS — G8929 Other chronic pain: Secondary | ICD-10-CM | POA: Diagnosis not present

## 2023-10-04 DIAGNOSIS — M6281 Muscle weakness (generalized): Secondary | ICD-10-CM | POA: Diagnosis not present

## 2023-10-04 DIAGNOSIS — M25511 Pain in right shoulder: Secondary | ICD-10-CM | POA: Diagnosis not present

## 2023-10-04 DIAGNOSIS — M25512 Pain in left shoulder: Secondary | ICD-10-CM | POA: Diagnosis not present

## 2023-10-04 NOTE — Therapy (Signed)
 OUTPATIENT PHYSICAL THERAPY SHOULDER TREATMENT    Patient Name: Christy [name] MRN: [id number] DOB:1941/04/05, 83 y.o., female Today's Date: 10/04/2023  END OF SESSION:  PT End of Session - 10/04/23 1404     Visit Number 13    Date for PT Re-Evaluation 09/20/23    Authorization Type [employer]    PT Start Time 1400    PT Stop Time 1445    PT Time Calculation (min) 45 min             Past Medical History:  Diagnosis Date   Agatston coronary artery calcium score greater than 400    Ca score 1121 in July 2022   Allergy    Arthritis    Chronic constipation    Depression    Diabetes mellitus without complication (HCC)    History of breast cancer    HLD (hyperlipidemia)    PAT (paroxysmal atrial tachycardia) (HCC)    [place] 02/2021   Pulmonary insufficiency    moderate by echo 11/23   Rheumatoid arthritis (HCC)    Past Surgical History:  Procedure Laterality Date   ABDOMINAL HYSTERECTOMY     ACHILLES TENDON REPAIR     BREAST REDUCTION SURGERY Left    CATARACT EXTRACTION     masectomy Right    TONSILLECTOMY AND ADENOIDECTOMY     Patient Active Problem List   Diagnosis Date Noted   Lip swelling 09/28/2022   Cognitive decline 09/02/2022   Insomnia 09/02/2022   Hearing loss 09/02/2022   Acute pain of left knee 05/10/2022   Rheumatoid arthritis of right foot (HCC) 03/31/2022   Type 2 diabetes mellitus with diabetic neuropathy, without long-term current use of insulin (HCC) 03/31/2022   Anxiety and depression 03/31/2022   Diarrhea of presumed infectious origin 03/31/2022   History of breast cancer 03/31/2022   Pulmonary insufficiency 05/13/2021   PAT (paroxysmal atrial tachycardia) (HCC) 04/14/2021   Agatston coronary artery calcium score greater than 400 04/14/2021   HLD (hyperlipidemia) 04/14/2021    PCP: Carilyn Goodpasture  REFERRING PROVIDER: Madelyn Brunner  REFERRING DIAG: chronic bilateral shoulder pain, right shoulder arthritis   THERAPY DIAG:  Chronic right  shoulder pain  Stiffness of right shoulder, not elsewhere classified  Muscle weakness (generalized)  Chronic left shoulder pain  Rationale for Evaluation and Treatment: Rehabilitation  ONSET DATE: "six months"  SUBJECTIVE:                                                                                                                                                                                      SUBJECTIVE STATEMENT:  RT shld doing good. Left shld sore for a day after last session  but good now- maybe I slept on it?   Hand dominance: Right  PERTINENT HISTORY: Rotunda Worden is a pleasant 83 y.o. female who presents today for bilateral, right greater than left shoulder pain. Right shoulder is the one that has been giving her issues for many months, left 1 only bothers her very intermittently and is not as bothersome. She did receive decent relief from subacromial joint injection but only lasted for a week or 2 and then her pain returned. She would like to start formalized physical therapy. She is on methotrexate 2.5 mg daily for her rheumatoid arthritis. Has used Tylenol in the past but no consistent pain medication.   Pertinent ROS were reviewed with the patient and found to be negative unless otherwise specified above in HPI.   PAIN:  Are you having pain? Yes: NPRS scale: 1/10 Pain location: R shoulder and into elbow/forearm  Pain description: sharp pain especially if I move the wrong way, worse in the mornings  Aggravating factors: reaching overhead, cooking sometimes especially if having to reach in the cupboards Relieving factors: ice packs  PRECAUTIONS: None  RED FLAGS: None   WEIGHT BEARING RESTRICTIONS: No  FALLS:  Has patient fallen in last 6 months? No  LIVING ENVIRONMENT: Lives with: lives with their family Lives in: House/apartment Stairs: No   PLOF: Independent and Independent with basic ADLs  PATIENT GOALS:to be more normal and have normal movements,  not to be concerned with movements hurting  NEXT MD VISIT:   OBJECTIVE:  Note: Objective measures were completed at Evaluation unless otherwise noted.  DIAGNOSTIC FINDINGS:  X-rays show mild to moderate glenohumeral joint space narrowing with  arthritic change.  There is a slightly higher pain indicative of rotator cuff arthropathy.  Mild AC joint arthropathy.  No acute fracture or bony abnormality noted   POSTURE: Rounded shoulders   UPPER EXTREMITY ROM:   Active ROM Right eval Left eval RT standing 09/14/23 RT shld standing  Shoulder flexion 115 w/pain 160 w/pain 150 159  Shoulder extension      Shoulder abduction 80 w/pain WFL 132  142  Shoulder adduction      Shoulder internal rotation 55 w/pain WFL 70 80  Shoulder external rotation 50 w/pain WFL 80 80  Elbow flexion      Elbow extension      Wrist flexion      Wrist extension      Wrist ulnar deviation      Wrist radial deviation      Wrist pronation      Wrist supination      (Blank rows = not tested)  UPPER EXTREMITY MMT:  MMT Right eval Left eval RT/Left 09/14/23   Shoulder flexion 2- 3- 3+/4-  Shoulder extension     Shoulder abduction 2- 3- 4-  Shoulder adduction     Shoulder internal rotation 3+ 3+ 4  Shoulder external rotation 3+ 3+  4  Middle trapezius     Lower trapezius     Elbow flexion     Elbow extension     Wrist flexion     Wrist extension     Wrist ulnar deviation     Wrist radial deviation     Wrist pronation     Wrist supination     Grip strength (lbs)     (Blank rows = not tested)  SHOULDER SPECIAL TESTS: Impingement tests: Neer impingement test: positive , Hawkins/Kennedy impingement test: positive , and Painful arc test: positive  Rotator  cuff assessment: Empty can test: positive   JOINT MOBILITY TESTING:  Increase muscle guarding and pain with PROM   PALPATION:  Some TTP    TODAY'S TREATMENT:                                                                                                                                          DATE:   10/04/23 UBE L 3 3 min fwd/3 back Tricep ext 20# 2 sets 10 Bicep 5# 2 sets 10 Seated row 20# 10x 2 sets  Lat Pull Down 15# 2 sets 10 Seated chest press 5# 2 sets 10 Seated wt ball chest press 10 x then OH 10 x Wt ball toss forward green ball 10x, red 10 x 5# standing cane ex -cued to increase ROM  10 x- flex,chest press abd,ext and IR PROM RT shld with func on end range stretch/hold   09/27/23 ROM taken at start of session- improvement noted Standing Nustep RT UE only L 2 push/pull 2 min , horz ABD 2 min Seated row 15# 10x 2 sets  Lat Pull Down 15# 2 sets 10, assist for pull down-good stretch OH Seated chest press 5# 2 sets 10 Seated wt ball chest press 10 x then OH 10 x Func cabinet reaching fwd and laterally 5x each RT UE 4# to 1st shelf- difficult for pt Switched to 2 # 2 level shelves 5 x each PROM RT shld with func on end range stretch/hold   09/20/23 UBE 3 min fwd/3 min backward Assessed ROM/MMT and goals for 10th visit progress note last session so did not measure again today- will use same values to ask for additional visits Seated row 15# 10x 2 sets with cuing to relax UT.  Lat Pull Down 15# 2 sets 10, assist for pull down-good stretch OH Seated chest press 5# 2 sets 10 2# func reaching various angles 10 x 3 sets  Facing wall various vector slides 5# cane flexion with PTA assist, pt can get to 90 then PTA pushes to about 160 then pt eccentric lowers 10 x Seated OH 1# 2 sets 8 PROM RT shld with func on end range stretch/hold  09/14/23 Assessed ROM/MMT and goals for 10th visit progress note UBE L 2 2 min 2 min backward Seated row 15# 8x with cuing to relax UT. Decreased 10# 8x to increase ROM Lat Pull Down 15# 2 sets 8, assist for pull down-good stretch OH Seated chest press 5# 2 sets 8 Cabinet reaching 2# 10 x flex and abd Yellow tband scap stab ex-various postions Thor ext standing with 4# -tactile cuing  needed PROM RT shld esp end range    09/13/23 UBE L2 min fwd/2 min back 5# shld ext 10 x with assist 5# shld row 10 x with assist 3# chest press 10 x 1 # shld chest press into OH 2 sets 5 standing Postural  wall angles 10 x 1# shld abd 3 sets 5 Yellow tband scap stab ex-various postions Thor ext with tband Mid row yellow TB 2x10 High row yellow TB 2x10 Shoulder ER AROM 10 x then yellow tband IR and ER 10 x each PROM RT shld with good ROM compared to act ex weakness limits ROM   09/08/23 Sitting  Thoracic extension against ball x10  Scap squeeze with PT overpressure 2x10  Shoulder horizontal abd low yellow TB 2x10  Mid row yellow TB 2x10  High row yellow TB 2x10  Shoulder ER AROM 2x10  Shoulder ER at 60 deg of scaption 2x10 AAROM  Thoracic extension with hands behind head x10  Shoulder flexion AROM 2x5 while maintaining thoracic extension  Shoulder scaption AROM x5 while maintaining thoracic extension   09/06/23 Sitting  Thoracic extension against ball 3x20" Thoracic ext + rotation x10  Scap squeeze with PT overpressure 2x10  Bicep stretch x30"  Tricep iso holds to PT resistance 2x10  Shoulder and elbow ext reactive iso red TB 2x10  Forearm pron/sup with elbow bent at 90 deg x10  Low Row red TB 2x10  Mid row red TB x10  08/25/23 Arm bike Level 1 for 6 min, only forwards (painful with backwards)  Wall slides, flexion 2x10 abduction 1x10  Row 2x10 yellow band Extension yellow band 2x10  Seated IR yellow band 2x10  Seated ER yellow band  2x10  Ice 10 minutes to right shoulder   08/23/23 Pball roll out forward x10 Pball CW and CCW roll x10 Sitting  Thoracic extension against ball 3x20"  Scap squeeze with PT overpressure 2x10  Shoulder ext iso holds with PT pulling yellow TB x10  Shoulder ER and IR iso holds with PT pulling yellow TB 2x10  Row with PT pulling yellow TB 2x10  Shoulder ER with arm in scaption ~30 deg x10 AAROM Standing  Pendulums fwd/bwd, side to  side x10 each  Wall pushup plus x10 Manual therapy:  Scapular mobilization Manual pec stretch  08/18/23 Attempted UBE but pt found it too painful today Pball roll out forward and side to side x10 each Pball roll CW & CCW x10 Pendulums fwd/bwd, side to side x10 Bicep stretch against doorway x30", attempted pec stretch but too much for pt Scap squeeze + pec overpressure for stretching x20 Scapular depression against chair x20 Performed sitting ER AROM but pt reporting increased pain after performing x10 Cervical retraction x20 UT stretch x30" R&L Standing ER red TB 2x10 Standing IR red TB 2x10 Thoracic extension against chair x10 Seated Row red TB x10 Scap retraction x10 Manual therapy:  Scapular mobilization Manual pec stretch Posterior joint mob grade II to III Inferior joint mob grade II to III  07/21/23 UBE L1 x4mins each way  Pec stretch in doorway Yellow band ER/IR 2x10 1# bicep curl 2x10 1# shoulder flexion and abduction 2x10 PROM in all directions MET to increase shoulder IR  Supine flexion AAROM with dowel x10 Supine abduction AAROM with dowel x10  Sidelying ER 1# 2x10   07/13/23 UBE L1 x3 mins each way  Wall slides 10x flexion and abd  Rows and ext red band 2x10 Standing AAROM flexion with 2# WaTE 2x10 Bicep curls with 2# WaTE 2x10 PROM in all directions and pec stretching  Ice pack to R shoulder for    EVAL 06/28/23   PATIENT EDUCATION: Education details: POC and HEP Person educated: Patient Education method: Explanation Education comprehension: verbalized understanding  HOME EXERCISE PROGRAM:  Access Code: [facility] URL: [email or web address] Date: 08/18/2023 Prepared by: Vernon Prey April Kirstie Peri  Exercises - Standing Shoulder Extension with Dowel  - 1 x daily - 7 x weekly - 2 sets - 10 reps - Standing Bilateral Shoulder Internal Rotation AAROM with Dowel  - 1 x daily - 7 x weekly - 2 sets - 10 reps - Shoulder Internal  Rotation with Resistance  - 1 x daily - 7 x weekly - 2 sets - 10 reps - Shoulder External Rotation with Anchored Resistance  - 1 x daily - 7 x weekly - 2 sets - 10 reps - Doorway Pec Stretch at 60 Degrees Abduction with Arm Straight  - 1 x daily - 7 x weekly - 2 sets - 30 sec hold - Seated Bilateral Shoulder Flexion Towel Slide at Table Top  - 1 x daily - 7 x weekly - 2 sets - 10 reps - Seated Scapular Retraction  - 1 x daily - 7 x weekly - 2 sets - 10 reps - Seated Thoracic Extension AROM  - 1 x daily - 7 x weekly - 1 sets - 10 reps  ASSESSMENT:  CLINICAL IMPRESSION: Progressed wt  interventions and pt fatigued easily esp as she nears and moves past 90 degrees.  weakness is chief issue. Postural and stab cuing needed OBJECTIVE IMPAIRMENTS: decreased mobility, decreased ROM, decreased strength, impaired UE functional use, and pain.     GOALS: Goals reviewed with patient? Yes  SHORT TERM GOALS: Target date: 08/09/23  Patient will be independent with initial HEP.  Baseline: given 06/28/23  Goal status: MET   LONG TERM GOALS: Target date: 09/20/23  Patient will be independent with advanced/ongoing HEP to improve outcomes and carryover.  Baseline:  Goal status: progressing 09/14/23  evolving 09/20/23  2.  Patient will report 75% improvement in R shoulder pain to improve QOL.  Baseline: 7/10 Goal status: 09/14/23 MET 75%  3.  Patient to improve R shoulder AROM to [facility] without pain provocation to allow for increased ease of ADLs.  Baseline: see chart Goal status: progressing 09/14/23 and 09/20/23  4.  Patient will demonstrate improved functional UE strength as demonstrated by 4/5 or better. Baseline: see chart Goal status: progressing 09/14/23 and 09/20/23  5.  Patient will be able to put a shirt on over her head.  Baseline: stopped trying Goal status: progressing 09/14/23 still difficult    PLAN:  PT FREQUENCY: 2x/week  PT DURATION: 12 weeks  PLANNED INTERVENTIONS: 97110-Therapeutic  exercises, 97530- Therapeutic activity, 97112- Neuromuscular re-education, 97535- Self Care, 16109- Manual therapy, Patient/Family education, Taping, Dry Needling, Joint mobilization, Spinal mobilization, Cryotherapy, and Moist heat  PLAN FOR NEXT SESSION: Progress strength and func as she fatigues easily and fatigues quickly once over 90 degrees  Abdulahad Mederos,ANGIE, PTA 10/04/2023, 2:04 PM [place] [facility] Outpatient Rehabilitation at [facility] [address] [place], [place], [address] Phone: [phone]   Fax:  [phone]  Patient Details  Name: Inioluwa Boulay MRN: [id number] Date of Birth: June 27, 1941 Referring Provider:  Carilyn Goodpasture, NP  Encounter Date: 10/04/2023  [facility] Outpatient Rehabilitation at [facility] [address] [place], [place], [address] Phone: [phone]   Fax:  [phone]  Patient Details  Name: Billijo Dilling MRN: [id number] Date of Birth: 06/02/1941 Referring Provider:  Carilyn Goodpasture, NP  Encounter Date: 10/04/2023   Suanne Marker, PTA 10/04/2023, 2:04 PM  [place]  Outpatient Rehabilitation at [facility] [address] [place], [place], [address] Phone:  [phone]   Fax:  [phone] Outpatient Rehabilitation at [facility] [address] [place], [place], [address] Phone: [phone]   Fax:  [phone]  Patient Details  Name: Otie Headlee MRN: [id number] Date of Birth: [birth year] Referring Provider:  Carilyn Goodpasture, NP  Encounter Date: 10/04/2023   Suanne Marker, PTA 10/04/2023, 2:04 PM  [place] [facility] at [facility] [address] [place], [place], [address] Phone: [phone]   Fax:  [phone] Outpatient Rehabilitation at [facility] [address] [place], [place], [address] Phone: [phone]   Fax:  [phone]  Patient Details  Name: Taleeya Blondin MRN: [id number] Date of  Birth: 1941-04-21 Referring Provider:  Carilyn Goodpasture, NP  Encounter Date: 10/04/2023   Suanne Marker, PTA 10/04/2023, 2:04 PM  [place] [facility] Rehabilitation at [facility] [address] [place], [place], [address] Phone: [phone]   Fax:  [phone]

## 2023-10-06 ENCOUNTER — Ambulatory Visit: Payer: Medicare PPO | Admitting: Physical Therapy

## 2023-10-12 ENCOUNTER — Ambulatory Visit: Payer: Medicare PPO | Attending: Sports Medicine | Admitting: Physical Therapy

## 2023-10-12 ENCOUNTER — Encounter: Payer: Self-pay | Admitting: Physical Therapy

## 2023-10-12 DIAGNOSIS — M25512 Pain in left shoulder: Secondary | ICD-10-CM | POA: Diagnosis not present

## 2023-10-12 DIAGNOSIS — M25511 Pain in right shoulder: Secondary | ICD-10-CM | POA: Diagnosis not present

## 2023-10-12 DIAGNOSIS — M6281 Muscle weakness (generalized): Secondary | ICD-10-CM | POA: Diagnosis not present

## 2023-10-12 DIAGNOSIS — M25611 Stiffness of right shoulder, not elsewhere classified: Secondary | ICD-10-CM | POA: Diagnosis not present

## 2023-10-12 DIAGNOSIS — G8929 Other chronic pain: Secondary | ICD-10-CM | POA: Diagnosis not present

## 2023-10-12 NOTE — Therapy (Signed)
 OUTPATIENT PHYSICAL THERAPY SHOULDER TREATMENT/RECERT    Patient Name: Christy [name] MRN: [id number] DOB:[birth year], 83 y.o., female Today's Date: 10/12/2023  END OF SESSION:  PT End of Session - 10/12/23 1452     Visit Number 14    Date for PT Re-Evaluation 11/23/23    Authorization Type [employer]    PT Start Time 1432    PT Stop Time 1512    PT Time Calculation (min) 40 min    Activity Tolerance Patient tolerated treatment well    Behavior During Therapy [facility] for tasks assessed/performed              Past Medical History:  Diagnosis Date   Agatston coronary artery calcium score greater than 400    Ca score 1121 in July 2022   Allergy    Arthritis    Chronic constipation    Depression    Diabetes mellitus without complication (HCC)    History of breast cancer    HLD (hyperlipidemia)    PAT (paroxysmal atrial tachycardia) (HCC)    Ziopatch 02/2021   Pulmonary insufficiency    moderate by echo 11/23   Rheumatoid arthritis (HCC)    Past Surgical History:  Procedure Laterality Date   ABDOMINAL HYSTERECTOMY     ACHILLES TENDON REPAIR     BREAST REDUCTION SURGERY Left    CATARACT EXTRACTION     masectomy Right    TONSILLECTOMY AND ADENOIDECTOMY     Patient Active Problem List   Diagnosis Date Noted   Lip swelling 09/28/2022   Cognitive decline 09/02/2022   Insomnia 09/02/2022   Hearing loss 09/02/2022   Acute pain of left knee 05/10/2022   Rheumatoid arthritis of right foot (HCC) 03/31/2022   Type 2 diabetes mellitus with diabetic neuropathy, without long-term current use of insulin (HCC) 03/31/2022   Anxiety and depression 03/31/2022   Diarrhea of presumed infectious origin 03/31/2022   History of breast cancer 03/31/2022   Pulmonary insufficiency 05/13/2021   PAT (paroxysmal atrial tachycardia) (HCC) 04/14/2021   Agatston coronary artery calcium score greater than 400 04/14/2021   HLD (hyperlipidemia) 04/14/2021    PCP: Carilyn Goodpasture  REFERRING  PROVIDER: Madelyn Brunner  REFERRING DIAG: chronic bilateral shoulder pain, right shoulder arthritis   THERAPY DIAG:  Chronic right shoulder pain  Muscle weakness (generalized)  Stiffness of right shoulder, not elsewhere classified  Rationale for Evaluation and Treatment: Rehabilitation  ONSET DATE: "six months"  SUBJECTIVE:                                                                                                                                                                                      SUBJECTIVE  STATEMENT:  Shoulder is "medium" today, overhead work is difficult. Getting dressed and taking a shower is hard still. They told me last time that I'm doing better but I need to improve strength.    Hand dominance: Right  PERTINENT HISTORY: Christy [name] is a pleasant 83 y.o. female who presents today for bilateral, right greater than left shoulder pain. Right shoulder is the one that has been giving her issues for many months, left 1 only bothers her very intermittently and is not as bothersome. She did receive decent relief from subacromial joint injection but only lasted for a week or 2 and then her pain returned. She would like to start formalized physical therapy. She is on methotrexate 2.5 mg daily for her rheumatoid arthritis. Has used Tylenol in the past but no consistent pain medication.   Pertinent ROS were reviewed with the patient and found to be negative unless otherwise specified above in HPI.   PAIN:  Are you having pain? Yes: NPRS scale: 2-3/10 Pain location: R shoulder   Pain description: just sore  Aggravating factors: reaching overhead, cooking sometimes especially if having to reach in the cupboards Relieving factors: not sure/random   PRECAUTIONS: None  RED FLAGS: None   WEIGHT BEARING RESTRICTIONS: No  FALLS:  Has patient fallen in last 6 months? No  LIVING ENVIRONMENT: Lives with: lives with their family Lives in: House/apartment Stairs:  No   PLOF: Independent and Independent with basic ADLs  PATIENT GOALS:to be more normal and have normal movements, not to be concerned with movements hurting  NEXT MD VISIT:   OBJECTIVE:  Note: Objective measures were completed at Evaluation unless otherwise noted.  DIAGNOSTIC FINDINGS:  X-rays show mild to moderate glenohumeral joint space narrowing with  arthritic change.  There is a slightly higher pain indicative of rotator cuff arthropathy.  Mild AC joint arthropathy.  No acute fracture or bony abnormality noted   POSTURE: Rounded shoulders   UPPER EXTREMITY ROM:   Active ROM Right eval Left eval RT standing 09/14/23 RT shld standing R 10/12/23  Shoulder flexion 115 w/pain 160 w/pain 150 159 135*  Shoulder extension       Shoulder abduction 80 w/pain WFL 132  142 147*  Shoulder adduction       Shoulder internal rotation 55 w/pain WFL 70 80   Shoulder external rotation 50 w/pain WFL 80 80   Elbow flexion       Elbow extension       Wrist flexion       Wrist extension       Wrist ulnar deviation       Wrist radial deviation       Wrist pronation       Wrist supination       (Blank rows = not tested)  UPPER EXTREMITY MMT:  MMT Right eval Left eval RT/Left 09/14/23  R/L 10/12/23  Shoulder flexion 2- 3- 3+/4- 3+/4  Shoulder extension      Shoulder abduction 2- 3- 4- 3+/4  Shoulder adduction      Shoulder internal rotation 3+ 3+ 4 4+/4+  Shoulder external rotation 3+ 3+  4 3+/3+  Middle trapezius      Lower trapezius      Elbow flexion      Elbow extension      Wrist flexion      Wrist extension      Wrist ulnar deviation      Wrist radial deviation  Wrist pronation      Wrist supination      Grip strength (lbs)      (Blank rows = not tested)  SHOULDER SPECIAL TESTS: Impingement tests: Neer impingement test: positive , Hawkins/Kennedy impingement test: positive , and Painful arc test: positive  Rotator cuff assessment: Empty can test: positive    JOINT MOBILITY TESTING:  Increase muscle guarding and pain with PROM   PALPATION:  Some TTP    TODAY'S TREATMENT:                                                                                                                                         DATE:   10/12/23   UBE L3 x4 min forward/4 backwards   Education towards goals, POC moving forward   Supine shoulder flexion 2# 2x10  Sidelying shoulder ABD 1# 2x10  Sidelying shoulder ER towel under elbow 2# 2x10  3 way reaches x3 B yellow band     10/04/23 UBE L 3 3 min fwd/3 back Tricep ext 20# 2 sets 10 Bicep 5# 2 sets 10 Seated row 20# 10x 2 sets  Lat Pull Down 15# 2 sets 10 Seated chest press 5# 2 sets 10 Seated wt ball chest press 10 x then OH 10 x Wt ball toss forward green ball 10x, red 10 x 5# standing cane ex -cued to increase ROM  10 x- flex,chest press abd,ext and IR PROM RT shld with func on end range stretch/hold   09/27/23 ROM taken at start of session- improvement noted Standing Nustep RT UE only L 2 push/pull 2 min , horz ABD 2 min Seated row 15# 10x 2 sets  Lat Pull Down 15# 2 sets 10, assist for pull down-good stretch OH Seated chest press 5# 2 sets 10 Seated wt ball chest press 10 x then OH 10 x Func cabinet reaching fwd and laterally 5x each RT UE 4# to 1st shelf- difficult for pt Switched to 2 # 2 level shelves 5 x each PROM RT shld with func on end range stretch/hold   09/20/23 UBE 3 min fwd/3 min backward Assessed ROM/MMT and goals for 10th visit progress note last session so did not measure again today- will use same values to ask for additional visits Seated row 15# 10x 2 sets with cuing to relax UT.  Lat Pull Down 15# 2 sets 10, assist for pull down-good stretch OH Seated chest press 5# 2 sets 10 2# func reaching various angles 10 x 3 sets  Facing wall various vector slides 5# cane flexion with PTA assist, pt can get to 90 then PTA pushes to about 160 then pt eccentric lowers 10  x Seated OH 1# 2 sets 8 PROM RT shld with func on end range stretch/hold  09/14/23 Assessed ROM/MMT and goals for 10th visit progress note UBE L 2 2 min 2 min backward Seated row 15#  8x with cuing to relax UT. Decreased 10# 8x to increase ROM Lat Pull Down 15# 2 sets 8, assist for pull down-good stretch OH Seated chest press 5# 2 sets 8 Cabinet reaching 2# 10 x flex and abd Yellow tband scap stab ex-various postions Thor ext standing with 4# -tactile cuing needed PROM RT shld esp end range      PATIENT EDUCATION: Education details: POC and HEP Person educated: Patient Education method: Explanation Education comprehension: verbalized understanding  HOME EXERCISE PROGRAM: Access Code: [facility] URL: [email or web address] Date: 08/18/2023 Prepared by: Vernon Prey April Kirstie Peri  Exercises - Standing Shoulder Extension with Dowel  - 1 x daily - 7 x weekly - 2 sets - 10 reps - Standing Bilateral Shoulder Internal Rotation AAROM with Dowel  - 1 x daily - 7 x weekly - 2 sets - 10 reps - Shoulder Internal Rotation with Resistance  - 1 x daily - 7 x weekly - 2 sets - 10 reps - Shoulder External Rotation with Anchored Resistance  - 1 x daily - 7 x weekly - 2 sets - 10 reps - Doorway Pec Stretch at 60 Degrees Abduction with Arm Straight  - 1 x daily - 7 x weekly - 2 sets - 30 sec hold - Seated Bilateral Shoulder Flexion Towel Slide at Table Top  - 1 x daily - 7 x weekly - 2 sets - 10 reps - Seated Scapular Retraction  - 1 x daily - 7 x weekly - 2 sets - 10 reps - Seated Thoracic Extension AROM  - 1 x daily - 7 x weekly - 1 sets - 10 reps  ASSESSMENT:  CLINICAL IMPRESSION:   Pt arrives today doing well, we updated objective measures and recerted with the MD this visit. Still a bit stiff and weak, we extended her POC- seems like she is making good progress thus far, has had significant improvement in pain levels and would benefit from ongoing strength/function focus to  improve QOL.      OBJECTIVE IMPAIRMENTS: decreased mobility, decreased ROM, decreased strength, impaired UE functional use, and pain.     GOALS: Goals reviewed with patient? Yes  SHORT TERM GOALS: Target date: 08/09/23 not updated at recert/goal already met   Patient will be independent with initial HEP.  Baseline: given 06/28/23  Goal status: MET   LONG TERM GOALS: Target date: 11/23/2023    Patient will be independent with advanced/ongoing HEP to improve outcomes and carryover.  Baseline:  Goal status: progressing 09/14/23  evolving 09/20/23  2.  Patient will report 75% improvement in R shoulder pain to improve QOL.  Baseline: 7/10 Goal status: 09/14/23 MET 75%  3.  Patient to improve R shoulder AROM to [facility] without pain provocation to allow for increased ease of ADLs.  Baseline: see chart Goal status: progressing 09/14/23 and 09/20/23  4.  Patient will demonstrate improved functional UE strength as demonstrated by 4/5 or better. Baseline: see chart Goal status: progressing 09/14/23 and 09/20/23  5.  Patient will be able to put a shirt on over her head.  Baseline: stopped trying Goal status: progressing 09/14/23 still difficult    PLAN:  PT FREQUENCY: 2x/week  PT DURATION: 8 weeks (to account for back dated visits)  PLANNED INTERVENTIONS: 97110-Therapeutic exercises, 97530- Therapeutic activity, 97112- Neuromuscular re-education, 97535- Self Care, 16109- Manual therapy, Patient/Family education, Taping, Dry Needling, Joint mobilization, Spinal mobilization, Cryotherapy, and Moist heat  PLAN FOR NEXT SESSION: Progress strength and func as she fatigues easily and fatigues  quickly once over 90 degrees    Nedra Hai, PT, DPT 10/12/23 3:24 PM

## 2023-10-17 DIAGNOSIS — H35363 Drusen (degenerative) of macula, bilateral: Secondary | ICD-10-CM | POA: Diagnosis not present

## 2023-10-17 DIAGNOSIS — H16223 Keratoconjunctivitis sicca, not specified as Sjogren's, bilateral: Secondary | ICD-10-CM | POA: Diagnosis not present

## 2023-10-17 DIAGNOSIS — Z961 Presence of intraocular lens: Secondary | ICD-10-CM | POA: Diagnosis not present

## 2023-10-17 DIAGNOSIS — H26493 Other secondary cataract, bilateral: Secondary | ICD-10-CM | POA: Diagnosis not present

## 2023-10-17 DIAGNOSIS — E119 Type 2 diabetes mellitus without complications: Secondary | ICD-10-CM | POA: Diagnosis not present

## 2023-10-18 ENCOUNTER — Ambulatory Visit: Payer: Medicare PPO | Admitting: Physical Therapy

## 2023-10-20 ENCOUNTER — Ambulatory Visit: Payer: Medicare PPO | Admitting: Physical Therapy

## 2023-10-25 ENCOUNTER — Ambulatory Visit: Payer: Medicare PPO | Admitting: Physical Therapy

## 2023-10-27 ENCOUNTER — Ambulatory Visit: Payer: Medicare PPO | Admitting: Physical Therapy

## 2023-10-27 ENCOUNTER — Encounter: Payer: Self-pay | Admitting: Physical Therapy

## 2023-10-27 DIAGNOSIS — G8929 Other chronic pain: Secondary | ICD-10-CM | POA: Diagnosis not present

## 2023-10-27 DIAGNOSIS — M25611 Stiffness of right shoulder, not elsewhere classified: Secondary | ICD-10-CM

## 2023-10-27 DIAGNOSIS — M25511 Pain in right shoulder: Secondary | ICD-10-CM | POA: Diagnosis not present

## 2023-10-27 DIAGNOSIS — M6281 Muscle weakness (generalized): Secondary | ICD-10-CM | POA: Diagnosis not present

## 2023-10-27 DIAGNOSIS — M25512 Pain in left shoulder: Secondary | ICD-10-CM | POA: Diagnosis not present

## 2023-10-27 NOTE — Therapy (Signed)
 OUTPATIENT PHYSICAL THERAPY SHOULDER TREATMENT    Patient Name: Christy [name] MRN: [id number] DOB:1940-12-15, 83 y.o., female Today's Date: 10/27/2023  END OF SESSION:  PT End of Session - 10/27/23 1347     Visit Number 15    Date for PT Re-Evaluation 11/23/23    Authorization Type [employer]    PT Start Time 1347    PT Stop Time 1427    PT Time Calculation (min) 40 min    Activity Tolerance Patient tolerated treatment well    Behavior During Therapy [facility] for tasks assessed/performed               Past Medical History:  Diagnosis Date   Agatston coronary artery calcium score greater than 400    Ca score 1121 in July 2022   Allergy    Arthritis    Chronic constipation    Depression    Diabetes mellitus without complication (HCC)    History of breast cancer    HLD (hyperlipidemia)    PAT (paroxysmal atrial tachycardia) (HCC)    Ziopatch 02/2021   Pulmonary insufficiency    moderate by echo 11/23   Rheumatoid arthritis (HCC)    Past Surgical History:  Procedure Laterality Date   ABDOMINAL HYSTERECTOMY     ACHILLES TENDON REPAIR     BREAST REDUCTION SURGERY Left    CATARACT EXTRACTION     masectomy Right    TONSILLECTOMY AND ADENOIDECTOMY     Patient Active Problem List   Diagnosis Date Noted   Lip swelling 09/28/2022   Cognitive decline 09/02/2022   Insomnia 09/02/2022   Hearing loss 09/02/2022   Acute pain of left knee 05/10/2022   Rheumatoid arthritis of right foot (HCC) 03/31/2022   Type 2 diabetes mellitus with diabetic neuropathy, without long-term current use of insulin (HCC) 03/31/2022   Anxiety and depression 03/31/2022   Diarrhea of presumed infectious origin 03/31/2022   History of breast cancer 03/31/2022   Pulmonary insufficiency 05/13/2021   PAT (paroxysmal atrial tachycardia) (HCC) 04/14/2021   Agatston coronary artery calcium score greater than 400 04/14/2021   HLD (hyperlipidemia) 04/14/2021    PCP: Carilyn Goodpasture  REFERRING PROVIDER:  Madelyn Brunner  REFERRING DIAG: chronic bilateral shoulder pain, right shoulder arthritis   THERAPY DIAG:  Chronic right shoulder pain  Muscle weakness (generalized)  Stiffness of right shoulder, not elsewhere classified  Rationale for Evaluation and Treatment: Rehabilitation  ONSET DATE: "six months"  SUBJECTIVE:  SUBJECTIVE STATEMENT:  Was out sick for awhile, fever is gone. Had a fall in the kitchen washing my hair in the sink, put my hand on my head and hit that, hit my right side more than anything.   Hand dominance: Right  PERTINENT HISTORY: Christy [name] is a pleasant 83 y.o. female who presents today for bilateral, right greater than left shoulder pain. Right shoulder is the one that has been giving her issues for many months, left 1 only bothers her very intermittently and is not as bothersome. She did receive decent relief from subacromial joint injection but only lasted for a week or 2 and then her pain returned. She would like to start formalized physical therapy. She is on methotrexate 2.5 mg daily for her rheumatoid arthritis. Has used Tylenol in the past but no consistent pain medication.   Pertinent ROS were reviewed with the patient and found to be negative unless otherwise specified above in HPI.   PAIN:  Are you having pain? Yes: NPRS scale: 5/10 Pain location: R shoulder   Pain description: just sore  Aggravating factors: reaching overhead, cooking sometimes especially if having to reach in the cupboards Relieving factors: not sure/random   PRECAUTIONS: None  RED FLAGS: None   WEIGHT BEARING RESTRICTIONS: No  FALLS:  Has patient fallen in last 6 months? No  LIVING ENVIRONMENT: Lives with: lives with their family Lives in: House/apartment Stairs: No   PLOF: Independent and  Independent with basic ADLs  PATIENT GOALS:to be more normal and have normal movements, not to be concerned with movements hurting  NEXT MD VISIT:   OBJECTIVE:  Note: Objective measures were completed at Evaluation unless otherwise noted.  DIAGNOSTIC FINDINGS:  X-rays show mild to moderate glenohumeral joint space narrowing with  arthritic change.  There is a slightly higher pain indicative of rotator cuff arthropathy.  Mild AC joint arthropathy.  No acute fracture or bony abnormality noted   POSTURE: Rounded shoulders   UPPER EXTREMITY ROM:   Active ROM Right eval Left eval RT standing 09/14/23 RT shld standing R 10/12/23  Shoulder flexion 115 w/pain 160 w/pain 150 159 135*  Shoulder extension       Shoulder abduction 80 w/pain WFL 132  142 147*  Shoulder adduction       Shoulder internal rotation 55 w/pain WFL 70 80   Shoulder external rotation 50 w/pain WFL 80 80   Elbow flexion       Elbow extension       Wrist flexion       Wrist extension       Wrist ulnar deviation       Wrist radial deviation       Wrist pronation       Wrist supination       (Blank rows = not tested)  UPPER EXTREMITY MMT:  MMT Right eval Left eval RT/Left 09/14/23  R/L 10/12/23  Shoulder flexion 2- 3- 3+/4- 3+/4  Shoulder extension      Shoulder abduction 2- 3- 4- 3+/4  Shoulder adduction      Shoulder internal rotation 3+ 3+ 4 4+/4+  Shoulder external rotation 3+ 3+  4 3+/3+  Middle trapezius      Lower trapezius      Elbow flexion      Elbow extension      Wrist flexion      Wrist extension      Wrist ulnar deviation      Wrist radial deviation  Wrist pronation      Wrist supination      Grip strength (lbs)      (Blank rows = not tested)  SHOULDER SPECIAL TESTS: Impingement tests: Neer impingement test: positive , Hawkins/Kennedy impingement test: positive , and Painful arc test: positive  Rotator cuff assessment: Empty can test: positive   JOINT MOBILITY TESTING:   Increase muscle guarding and pain with PROM   PALPATION:  Some TTP    TODAY'S TREATMENT:                                                                                                                                         DATE:    10/27/23  UBE L1 x3 min forward/3 min backward self selected pace  Wall ladder for flexion x2 (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), transitioned to wall pillowcase slides an additional 8 times  Supine shoulder flexion 1# 2x10 Sidelying shoulder ABD 1# 2x10  Sidelying shoulder ER 1# 2x10  Backwards shoulder rolls seated x20     10/12/23   UBE L3 x4 min forward/4 backwards   Education towards goals, POC moving forward   Supine shoulder flexion 2# 2x10  Sidelying shoulder ABD 1# 2x10  Sidelying shoulder ER towel under elbow 2# 2x10  3 way reaches x3 B yellow band     PATIENT EDUCATION: Education details: POC and HEP Person educated: Patient Education method: Explanation Education comprehension: verbalized understanding  HOME EXERCISE PROGRAM: Access Code: [facility] URL: [email or web address] Date: 08/18/2023 Prepared by: Vernon Prey April Kirstie Peri  Exercises - Standing Shoulder Extension with Dowel  - 1 x daily - 7 x weekly - 2 sets - 10 reps - Standing Bilateral Shoulder Internal Rotation AAROM with Dowel  - 1 x daily - 7 x weekly - 2 sets - 10 reps - Shoulder Internal Rotation with Resistance  - 1 x daily - 7 x weekly - 2 sets - 10 reps - Shoulder External Rotation with Anchored Resistance  - 1 x daily - 7 x weekly - 2 sets - 10 reps - Doorway Pec Stretch at 60 Degrees Abduction with Arm Straight  - 1 x daily - 7 x weekly - 2 sets - 30 sec hold - Seated Bilateral Shoulder Flexion Towel Slide at Table Top  - 1 x daily - 7 x weekly - 2 sets - 10 reps - Seated Scapular Retraction  - 1 x daily - 7 x weekly - 2 sets - 10 reps - Seated Thoracic Extension AROM  - 1 x daily - 7 x weekly - 1 sets - 10 reps  ASSESSMENT:  CLINICAL  IMPRESSION:   Pt arrives after having been out of PT for a couple weeks due to being sick and also having had a fall recently (no known injuries). Took it easy today and worked on gentle ROM and strength today as tolerated given fatigue and increased pain levels.  Will work to build her back up and work towards goals as able.     OBJECTIVE IMPAIRMENTS: decreased mobility, decreased ROM, decreased strength, impaired UE functional use, and pain.     GOALS: Goals reviewed with patient? Yes  SHORT TERM GOALS: Target date: 08/09/23 not updated at recert/goal already met   Patient will be independent with initial HEP.  Baseline: given 06/28/23  Goal status: MET   LONG TERM GOALS: Target date: 11/23/2023    Patient will be independent with advanced/ongoing HEP to improve outcomes and carryover.  Baseline:  Goal status: progressing 09/14/23  evolving 09/20/23  2.  Patient will report 75% improvement in R shoulder pain to improve QOL.  Baseline: 7/10 Goal status: 09/14/23 MET 75%  3.  Patient to improve R shoulder AROM to [facility] without pain provocation to allow for increased ease of ADLs.  Baseline: see chart Goal status: progressing 09/14/23 and 09/20/23  4.  Patient will demonstrate improved functional UE strength as demonstrated by 4/5 or better. Baseline: see chart Goal status: progressing 09/14/23 and 09/20/23  5.  Patient will be able to put a shirt on over her head.  Baseline: stopped trying Goal status: progressing 09/14/23 still difficult    PLAN:  PT FREQUENCY: 2x/week  PT DURATION: 8 weeks (to account for back dated visits)  PLANNED INTERVENTIONS: 97110-Therapeutic exercises, 97530- Therapeutic activity, 97112- Neuromuscular re-education, 97535- Self Care, 16109- Manual therapy, Patient/Family education, Taping, Dry Needling, Joint mobilization, Spinal mobilization, Cryotherapy, and Moist heat  PLAN FOR NEXT SESSION: Progress strength and func as she fatigues easily and  fatigues quickly once over 90 degrees, progress as tolerated- was out sick for a couple weeks and still recovering     Nedra Hai, PT, DPT 10/27/23 2:27 PM

## 2023-10-31 ENCOUNTER — Encounter: Payer: Self-pay | Admitting: Neurology

## 2023-10-31 ENCOUNTER — Ambulatory Visit (INDEPENDENT_AMBULATORY_CARE_PROVIDER_SITE_OTHER): Payer: Medicare PPO | Admitting: Neurology

## 2023-10-31 VITALS — BP 144/67 | HR 69 | Ht 63.0 in | Wt 147.0 lb

## 2023-10-31 DIAGNOSIS — Z9181 History of falling: Secondary | ICD-10-CM | POA: Diagnosis not present

## 2023-10-31 DIAGNOSIS — R269 Unspecified abnormalities of gait and mobility: Secondary | ICD-10-CM | POA: Diagnosis not present

## 2023-10-31 DIAGNOSIS — R5383 Other fatigue: Secondary | ICD-10-CM

## 2023-10-31 DIAGNOSIS — G20C Parkinsonism, unspecified: Secondary | ICD-10-CM

## 2023-10-31 DIAGNOSIS — R413 Other amnesia: Secondary | ICD-10-CM

## 2023-10-31 NOTE — Progress Notes (Signed)
 Subjective:    Patient ID: Christy [name] is a 83 y.o. female.  HPI    Interim history:   [name] is an 83 year old female with an underlying medical history of rheumatoid arthritis, allergies, chronic constipation, hyperlipidemia, tachycardia, pulmonary insufficiency, diabetes, history of breast cancer with status post right mastectomy, and memory loss, who presents for follow-up consultation of her memory loss.  The patient is accompanied by her husband today.  She was previously followed in this clinic by Dr. Ocie [name] and was evaluated originally in September 2024.  I reviewed the office visit note and copied the note below for reference.  Her MoCA score was 24 at the time.  She was encouraged to talk to PCP about restarting her antidepressant.  Patient's vitamin B12 level was normal in September 2024 at 543, TSH normal at 1.8.    She had a brain MRI with and without contrast on 06/12/2023 and I reviewed the results:   IMPRESSION: This MRI of the brain with and without contrast shows the following: 1. There are no acute findings.  Normal enhancement pattern. 2. Mild to moderate generalized cortical atrophy, mildly more pronounced in the medial temporal lobes.  Mild corpus callosal and cerebellar atrophy is also noted. 3. Small remote left frontal lobe infarction.   4. scattered T2/FLAIR hyperintense foci in the cerebral hemispheres [name] consistent with mild chronic microvascular ischemic change, typical for age.   In addition, I personally and independently reviewed images through the PACS system.  They were notified of the test results by phone call.  Today, 10/31/2023: She reports that her forgetfulness has become worse.  She is primarily bothered by her lack of energy and fatigue.  Her husband endorses that she has a tendency to sleep during the day for extended naps.  She has a good support system.  She has 2 biological children from her first marriage and he has biological  children from his prior marriages.  He reports that she does not feel comfortable being by herself for too long and would like for him to come back home sooner than he can sometimes.   She had a fall recently.  She fell in the kitchen, they were washing her hair in the kitchen sink as it is easier for him to help her that way.  She fell backwards and landed on her behind and braced her fall with her hands, did not hit her head.  She sees rheumatology on a regular basis.  She reports that her mom had dementia, she lived to be 49, her dad passed at 10 with lung cancer, she lost her only sister at the age of 53 with breast cancer.  Her maternal grandmother lived to be 74.  She is a retired Clinical biochemist.  She worked at [employer] and also as a Management consultant in the past.  She is currently weaning off of Prozac as she had side effects.  She is a non-smoker and does not drink alcohol.    Of note, she had a home sleep test interpreted by Dr. Armanda [name] on 04/17/2022, which did not show any significant obstructive or central sleep apnea with an AHI of 4.8/h, O2 nadir 88% with mild snoring detected.  She has been in outpatient physical therapy.  She has pain in her right shoulder and has seen orthopedics for this.   The patient's allergies, current medications, family history, past medical history, past social history, past surgical history and  problem list were reviewed and updated as appropriate.   Previously:  05/04/2023 (Dr. Ocie [name]): <<HISTORY OF PRESENT ILLNESS:  The patient presents for evaluation of memory loss which began when she turned 83 years old. She has noticed worsening word finding difficulty and losing her train of thought. Has trouble remember the names of acquaintances. Misplaces objects around the house. Will occasionally forget to take her medications but remembers later that day. Has a little more trouble managing finances lately but is able to pay  bills without missing any payments.   For the past 2 years she has had spells where her chest and neck become warm and flushed. Stops her in her tracks and she will grab something so she doesn't fall. Lasts 5 seconds at a time. Feels shaky afterwards. She is typically standing when this happens. These episodes occur once every 1-2 weeks. She is scheduled for a TTE in October.   TBI:  No past history of TBI Stroke:  no past history of stroke Seizures:  no past history of seizures Sleep: Has trouble sleeping at night and wakes up every 2 hours to use the restroom. Snores at night, did a home sleep study which was negative but has not had an in-lab study. Sleeps a lot during the day. Mood: She has a history of anxiety and depression. Has taken Prozac for this previously but is not currently on anything.   Functional status: Patient lives with her husband Cooking: Doesn't cook much at baseline, has not left the stove on Bathing: Leaves water running in the bathroom. Has left it on for 30 minute and forgotten about it. No issues with bathing or using the bathroom. Driving: She drives to familiar places that are close by. Has not gotten lost while she's driving Bills: [name] of her bills are autopay. She has had more difficulty managing her bills but has not missed a payment Medications: Sometimes forgets to take her medications. Will typically take them but may take them late Getting lost going to familiar places?: no Forgetting loved ones names?: no Word finding difficulty? yes   OTHER MEDICAL CONDITIONS: breast cancer, anxiety, HLD, DM2 >>  Her Past Medical History Is Significant For: Past Medical History:  Diagnosis Date   Agatston coronary artery calcium score greater than 400    Ca score 1121 in July 2022   Allergy    Arthritis    Chronic constipation    Depression    Diabetes mellitus without complication (HCC)    History of breast cancer    HLD (hyperlipidemia)    PAT (paroxysmal  atrial tachycardia) (HCC)    [place]iop[place] 02/2021   Pulmonary insufficiency    moderate by echo 11/23   Rheumatoid arthritis (HCC)     Her Past Surgical History Is Significant For: Past Surgical History:  Procedure Laterality Date   ABDOMINAL HYSTERECTOMY     ACHILLES TENDON REPAIR     BREAST REDUCTION SURGERY Left    CATARACT EXTRACTION     masectomy Right    TONSILLECTOMY AND ADENOIDECTOMY      Her Family History Is Significant For: Family History  Problem Relation Age of Onset   Arthritis Mother    Dementia Mother    Stroke Mother    Alcohol abuse Father    Cancer Father        lung   Arthritis Sister    Cancer Sister    Early death Sister    Learning disabilities Son    Depression  Son    Diabetes Son    Arthritis Maternal Grandmother    Hearing loss Maternal Grandmother    Heart attack Maternal Grandfather     Her Social History Is Significant For: Social History   Socioeconomic History   Marital status: Married    Spouse name: Not on file   Number of children: 2   Years of education: Not on file   Highest education level: Master's degree (e.g., MA, MS, MEng, MEd, MSW, MBA)  Occupational History   Not on file  Tobacco Use   Smoking status: Never   Smokeless tobacco: Never  Vaping Use   Vaping status: Never Used  Substance and Sexual Activity   Alcohol use: Not Currently   Drug use: Not Currently   Sexual activity: Not Currently  Other Topics Concern   Not on file  Social History Narrative   Right Handed   No Caffeine Use    Lives with husband   Social Drivers of Health   Financial Resource Strain: Low Risk  (11/12/2022)   Overall Financial Resource Strain (CARDIA)    Difficulty of Paying Living Expenses: Not hard at all  Food Insecurity: No Food Insecurity (11/12/2022)   Hunger Vital Sign    Worried About Running Out of Food in the Last Year: Never true    Ran Out of Food in the Last Year: Never true  Transportation Needs: No Transportation Needs  (11/12/2022)   PRAPARE - Administrator, Civil Service (Medical): No    Lack of Transportation (Non-Medical): No  Physical Activity: Insufficiently Active (11/12/2022)   Exercise Vital Sign    Days of Exercise per Week: 3 days    Minutes of Exercise per Session: 20 min  Stress: Stress Concern Present (11/12/2022)   [employer] of Occupational Health - Occupational Stress Questionnaire    Feeling of Stress : To some extent  Social Connections: Unknown (11/12/2022)   Social Connection and Isolation Panel [NHANES]    Frequency of Communication with Friends and Family: More than three times a week    Frequency of Social Gatherings with Friends and Family: More than three times a week    Attends Religious Services: More than 4 times per year    Active Member of [employer] or Organizations: Not on file    Attends Banker Meetings: Not on file    Marital Status: Married    Her Allergies Are:  Allergies  Allergen Reactions   Atorvastatin     Joint pain on 20mg  daily   Epinephrine    Erythromycin    Gluten Swelling    Feet will swell.  Patient states no longer an issue.   Lovaza [Omega-3-Acid Ethyl Esters (Fish)]     Dizziness and fatiuge  Pt and husband are unaware of any allergies/intolerance and say pt takes a fish oil everyday.   Penicillins    Rosuvastatin     Myalgias and joint pain on 5mg  daily   Tetracyclines & Related   :   Her Current Medications Are:  Outpatient Encounter Medications as of 10/31/2023  Medication Sig   Bempedoic Acid-Ezetimibe (NEXLIZET) 180-10 MG TABS Take 1 tablet by mouth daily.   Bempedoic Acid-Ezetimibe (NEXLIZET) 180-10 MG TABS Take by mouth.   Calcium Citrate 150 MG CAPS Take 1,000 mg by mouth daily.     Cholecalciferol (VITAMIN D) 1000 UNITS capsule Take 1,000 Units by mouth daily.     FLUoxetine (PROZAC) 20 MG tablet Take 20 mg by  mouth daily.   folic acid (FOLVITE) 1 MG tablet Take 1 mg by mouth daily.     glucose blood  (TRUE METRIX BLOOD GLUCOSE TEST) test strip 1 each by Other route as directed. Use as instructed   MAGNESIUM PO Take 500 mg by mouth at bedtime.   metFORMIN (GLUCOPHAGE) 500 MG tablet Take 500 mg by mouth 2 (two) times daily with a meal.     methotrexate (RHEUMATREX) 2.5 MG tablet Take 2.5 mg by mouth. Caution:Chemotherapy. Protect from light.    MULTIPLE VITAMIN PO Take 1 tablet by mouth daily.     Omega-3 Fatty Acids (FISH OIL PO) Take by mouth.   PSYLLIUM PO Take 8-10 capsules by mouth daily.     RYBELSUS 14 MG TABS Take 14 mg by mouth daily.   celecoxib (CELEBREX) 100 MG capsule Take 1 capsule (100 mg total) by mouth daily as needed. (Patient not taking: Reported on 10/31/2023)   No facility-administered encounter medications on file as of 10/31/2023.  :  Review of Systems:  Out of a complete 14 point review of systems, all are reviewed and negative with the exception of these symptoms as listed below:   Review of Systems  Neurological:        Patient is here with her husband Christy [name] for follow-up of memory. She is a former patient of Dr Delena Bali. She feels her memory is worse since her last visit. She has trouble getting dressed. She states she has problems with her shoulder. She denies trouble eating and drinking. She splits meal preparation with her husband. She drives short distances only. She has trouble with falling asleep and staying asleep. She wakes up every 2 hours. She doesn't why she wakes up but she will go to the restroom before she falls back asleep. She snores a little bit, per husband. MOCA 18/30    Objective:  Neurological Exam  Physical Exam Physical Examination:   Vitals:   10/31/23 1309  BP: (!) 144/67  Pulse: 69    General Examination: The patient is a very pleasant 83 y.o. female in no acute distress. She appears well-developed and well-nourished and well groomed.   HEENT: Normocephalic, atraumatic, pupils are equal, round and reactive to light, extraocular  tracking is fairly well-preserved but she does have mild facial masking and mild nuchal rigidity.  Speech is slightly hypophonic but not dysarthric and no voice tremor noted.  Hearing is grossly intact.  No carotid bruits.  Airway examination reveals mild mouth dryness.  Tongue protrudes centrally and palate elevates symmetrically.  No sialorrhea.  No lip, neck or jaw tremor.    Chest: Clear to auscultation without wheezing, rhonchi or crackles noted.  Heart: S1+S2+0, regular and normal without murmurs, rubs or gallops noted.   Abdomen: Soft, non-tender and non-distended.  Extremities: There is no pitting edema in the distal lower extremities bilaterally.   Skin: Warm and dry without trophic changes noted.   Musculoskeletal: exam reveals prominent arthritic changes in both hands, right more than left with ulnar deviation noted.     Neurologically:  Mental status: The patient is awake, alert and oriented in all 4 spheres. Her immediate and remote memory, attention, language skills and fund of knowledge are fairly appropriate. There is no evidence of aphasia, agnosia, apraxia or anomia. Speech is clear with normal prosody and enunciation. Thought process is linear. Mood is constricted and affect is blunted.     10/31/2023    4:27 PM 05/04/2023    2:00 PM  Montreal Cognitive Assessment   Visuospatial/ Executive (0/5) 2 5  Naming (0/3) 3 3  Attention: Read list of digits (0/2) 1 2  Attention: Read list of letters (0/1) 1 1  Attention: Serial 7 subtraction starting at 100 (0/3) 3 3  Language: Repeat phrase (0/2) 1 1  Language : Fluency (0/1) 0 1  Abstraction (0/2) 2 2  Delayed Recall (0/5) 0 0  Orientation (0/6) 5 6  Total 18 24  Adjusted Score (based on education) 18 24    Cranial nerves II - XII are as described above under HEENT exam.  Motor exam: Thin bulk, global strength of about 4 out of 5.  No obvious resting tremor, slight and intermittent bilateral upper extremity postural  tremor noted, right more than left.  Fine motor skills and coordination: Impaired finger taps, hand movements and rapid alternating patting as well as foot taps mildly impaired, all mildly impaired with no significant lateralization noted.  Cerebellar testing: No dysmetria or intention tremor. There is no truncal or gait ataxia.  Sensory exam: intact to light touch in the upper and lower extremities.  Gait, station and balance: She stands with difficulty, posture is age-appropriate, she walks slowly and cautiously, no walking age, mild decrease in arm swing noted and smaller steps noted.  No telltale shuffling.   Assessment and plan:  In summary, Marlo Arriola is a very pleasant 83 year old female with an underlying complex medical history of rheumatoid arthritis, allergies, chronic constipation, hyperlipidemia, tachycardia, pulmonary insufficiency, diabetes, history of breast cancer with status post right mastectomy, and memory loss, who presents for follow-up consultation of her memory loss.  Her MoCA scores have declined within the past 6 months.  She has had trouble tolerating her antidepressant medication.  She suffers from significant fatigue.  She had a home sleep test through cardiology which was negative for sleep apnea in September 2023.  She has some changes with regards to her balance and her mobility in general.  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  She does not have any obvious lateralization but I would like to proceed with further testing.  We will do some blood work today for Alzheimer's markers and also proceed with a DaTscan.  I explained the scan to the patient and her husband in detail today and they were also given a patient pamphlet.  We will await test results and follow-up in this clinic in about 3 months.  We are mutually in agreement not to start any new medication quite yet.  We talked about the importance of maintaining a healthy lifestyle and fall  prevention also today.  This was an extended visit of over 60 minutes with high complexity, addressing multiple issues and with significant counseling and coordination of care involved. I answered all the questions today and the patient and her husband were in agreement with our approach.

## 2023-10-31 NOTE — Patient Instructions (Signed)
 We will check blood work today and call you with the test results. We will do a DaT scan: This is a specialized brain scan designed to help with diagnosis of tremor disorders. A radioactive marker gets injected and the uptake is measured in the brain and compared to normal controls and right side is compared to the left, a change in uptake can help with diagnosis of certain tremor disorders. A brain MRI on the other hand is a brain scan that helps look at the brain structure in more detail overall and look for age-related changes, blood vessel related changes and look for stroke and volume loss which we call atrophy.

## 2023-11-01 ENCOUNTER — Encounter: Payer: Self-pay | Admitting: Physical Therapy

## 2023-11-01 ENCOUNTER — Ambulatory Visit: Payer: Medicare PPO | Admitting: Physical Therapy

## 2023-11-01 DIAGNOSIS — M25511 Pain in right shoulder: Secondary | ICD-10-CM | POA: Diagnosis not present

## 2023-11-01 DIAGNOSIS — G8929 Other chronic pain: Secondary | ICD-10-CM | POA: Diagnosis not present

## 2023-11-01 DIAGNOSIS — M25512 Pain in left shoulder: Secondary | ICD-10-CM | POA: Diagnosis not present

## 2023-11-01 DIAGNOSIS — M25611 Stiffness of right shoulder, not elsewhere classified: Secondary | ICD-10-CM | POA: Diagnosis not present

## 2023-11-01 DIAGNOSIS — M6281 Muscle weakness (generalized): Secondary | ICD-10-CM

## 2023-11-01 NOTE — Therapy (Signed)
 OUTPATIENT PHYSICAL THERAPY SHOULDER TREATMENT/INSURANCE REAUTH     Patient Name: Christy [name] MRN: [id number] DOB:Jun 21, 1941, 83 y.o., female Today's Date: 11/01/2023  END OF SESSION:  PT End of Session - 11/01/23 1443     Visit Number 16    Date for PT Re-Evaluation 11/23/23    Authorization Type [employer]    PT Start Time 1433    PT Stop Time 1512    PT Time Calculation (min) 39 min    Activity Tolerance Patient tolerated treatment well    Behavior During Therapy [facility] for tasks assessed/performed                Past Medical History:  Diagnosis Date   Agatston coronary artery calcium score greater than 400    Ca score 1121 in July 2022   Allergy    Arthritis    Chronic constipation    Depression    Diabetes mellitus without complication (HCC)    History of breast cancer    HLD (hyperlipidemia)    PAT (paroxysmal atrial tachycardia) (HCC)    Ziopatch 02/2021   Pulmonary insufficiency    moderate by echo 11/23   Rheumatoid arthritis (HCC)    Past Surgical History:  Procedure Laterality Date   ABDOMINAL HYSTERECTOMY     ACHILLES TENDON REPAIR     BREAST REDUCTION SURGERY Left    CATARACT EXTRACTION     masectomy Right    TONSILLECTOMY AND ADENOIDECTOMY     Patient Active Problem List   Diagnosis Date Noted   Lip swelling 09/28/2022   Cognitive decline 09/02/2022   Insomnia 09/02/2022   Hearing loss 09/02/2022   Acute pain of left knee 05/10/2022   Rheumatoid arthritis of right foot (HCC) 03/31/2022   Type 2 diabetes mellitus with diabetic neuropathy, without long-term current use of insulin (HCC) 03/31/2022   Anxiety and depression 03/31/2022   Diarrhea of presumed infectious origin 03/31/2022   History of breast cancer 03/31/2022   Pulmonary insufficiency 05/13/2021   PAT (paroxysmal atrial tachycardia) (HCC) 04/14/2021   Agatston coronary artery calcium score greater than 400 04/14/2021   HLD (hyperlipidemia) 04/14/2021    PCP: Carilyn Goodpasture  REFERRING PROVIDER: Madelyn Brunner  REFERRING DIAG: chronic bilateral shoulder pain, right shoulder arthritis   THERAPY DIAG:  Chronic right shoulder pain  Muscle weakness (generalized)  Stiffness of right shoulder, not elsewhere classified  Chronic left shoulder pain  Rationale for Evaluation and Treatment: Rehabilitation  ONSET DATE: "six months"  SUBJECTIVE:  SUBJECTIVE STATEMENT:  Feel "meh". Not much is going on. Shoulder is better, knee and foot hurt this morning but its better now. Seeing Dr. Shon Baton next month.   Hand dominance: Right  PERTINENT HISTORY: Cyleigh Massaro is a pleasant 83 y.o. female who presents today for bilateral, right greater than left shoulder pain. Right shoulder is the one that has been giving her issues for many months, left 1 only bothers her very intermittently and is not as bothersome. She did receive decent relief from subacromial joint injection but only lasted for a week or 2 and then her pain returned. She would like to start formalized physical therapy. She is on methotrexate 2.5 mg daily for her rheumatoid arthritis. Has used Tylenol in the past but no consistent pain medication.   Pertinent ROS were reviewed with the patient and found to be negative unless otherwise specified above in HPI.   PAIN:  Are you having pain? Yes: NPRS scale: 3/10 Pain location: R shoulder   Pain description: just sore  Aggravating factors: reaching overhead, cooking sometimes especially if having to reach in the cupboards Relieving factors: not sure/random   PRECAUTIONS: None  RED FLAGS: None   WEIGHT BEARING RESTRICTIONS: No  FALLS:  Has patient fallen in last 6 months? No  LIVING ENVIRONMENT: Lives with: lives with their family Lives in: House/apartment Stairs:  No   PLOF: Independent and Independent with basic ADLs  PATIENT GOALS:to be more normal and have normal movements, not to be concerned with movements hurting  NEXT MD VISIT:   OBJECTIVE:  Note: Objective measures were completed at Evaluation unless otherwise noted.  DIAGNOSTIC FINDINGS:  X-rays show mild to moderate glenohumeral joint space narrowing with  arthritic change.  There is a slightly higher pain indicative of rotator cuff arthropathy.  Mild AC joint arthropathy.  No acute fracture or bony abnormality noted   POSTURE: Rounded shoulders   UPPER EXTREMITY ROM:   Active ROM Right eval Left eval RT standing 09/14/23 RT shld standing R 10/12/23 R 11/01/23  Shoulder flexion 115 w/pain 160 w/pain 150 159 135* 125* seated AROM, 150* AAROM supine  Shoulder extension        Shoulder abduction 80 w/pain WFL 132  142 147* 110*  Shoulder adduction        Shoulder internal rotation 55 w/pain WFL 70 80    Shoulder external rotation 50 w/pain WFL 80 80    Elbow flexion        Elbow extension        Wrist flexion        Wrist extension        Wrist ulnar deviation        Wrist radial deviation        Wrist pronation        Wrist supination        (Blank rows = not tested)  UPPER EXTREMITY MMT:  MMT Right eval Left eval RT/Left 09/14/23  R/L 10/12/23 R 11/01/23  Shoulder flexion 2- 3- 3+/4- 3+/4 3+  Shoulder extension       Shoulder abduction 2- 3- 4- 3+/4 3  Shoulder adduction       Shoulder internal rotation 3+ 3+ 4 4+/4+   Shoulder external rotation 3+ 3+  4 3+/3+   Middle trapezius       Lower trapezius       Elbow flexion       Elbow extension       Wrist flexion  Wrist extension       Wrist ulnar deviation       Wrist radial deviation       Wrist pronation       Wrist supination       Grip strength (lbs)       (Blank rows = not tested)  SHOULDER SPECIAL TESTS: Impingement tests: Neer impingement test: positive , Hawkins/Kennedy impingement test:  positive , and Painful arc test: positive  Rotator cuff assessment: Empty can test: positive   JOINT MOBILITY TESTING:  Increase muscle guarding and pain with PROM   PALPATION:  Some TTP    TODAY'S TREATMENT:                                                                                                                                         DATE:   11/01/23  UBE L2x4 min forward/4 min backward  MMT/ROM as above Putting/taking 1# on/off 2nd shelf in gym x10   Supine shoulder AAROM with dowel x15  Chest press with dowel x15 Supine shoulder flexion 1# x15  Sidelying shoulder ABD 1# x15 Sidelying shoulder ER 1# x15  Thoracic extensions with OH reach x10  Thoracic lateral ROM x10 B  Thoracic rotation ROM x10 B     10/27/23  UBE L1 x3 min forward/3 min backward self selected pace  Wall ladder for flexion x2 (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), transitioned to wall pillowcase slides an additional 8 times  Supine shoulder flexion 1# 2x10 Sidelying shoulder ABD 1# 2x10  Sidelying shoulder ER 1# 2x10  Backwards shoulder rolls seated x20     10/12/23   UBE L3 x4 min forward/4 backwards   Education towards goals, POC moving forward   Supine shoulder flexion 2# 2x10  Sidelying shoulder ABD 1# 2x10  Sidelying shoulder ER towel under elbow 2# 2x10  3 way reaches x3 B yellow band     PATIENT EDUCATION: Education details: POC and HEP Person educated: Patient Education method: Explanation Education comprehension: verbalized understanding  HOME EXERCISE PROGRAM: Access Code: [facility] URL: [email or web address] Date: 08/18/2023 Prepared by: Vernon Prey April Kirstie Peri  Exercises - Standing Shoulder Extension with Dowel  - 1 x daily - 7 x weekly - 2 sets - 10 reps - Standing Bilateral Shoulder Internal Rotation AAROM with Dowel  - 1 x daily - 7 x weekly - 2 sets - 10 reps - Shoulder Internal Rotation with Resistance  - 1 x daily - 7 x weekly - 2 sets - 10  reps - Shoulder External Rotation with Anchored Resistance  - 1 x daily - 7 x weekly - 2 sets - 10 reps - Doorway Pec Stretch at 60 Degrees Abduction with Arm Straight  - 1 x daily - 7 x weekly - 2 sets - 30 sec hold - Seated Bilateral Shoulder Flexion Towel Slide at Table Top  - 1 x daily -  7 x weekly - 2 sets - 10 reps - Seated Scapular Retraction  - 1 x daily - 7 x weekly - 2 sets - 10 reps - Seated Thoracic Extension AROM  - 1 x daily - 7 x weekly - 1 sets - 10 reps  ASSESSMENT:  CLINICAL IMPRESSION:   Pt arrives today still not feeling well, we again had to modify session based on malaise and energy levels. Shoulder remains quite stiff and weak, but again she has not been feeling well here recently. Will ask for more visits from insurance at least to get [place] out to her ortho appt (reported late April). Felt lightheaded at EOS like possible low blood sugar provided chocolate and water and sx improved.     OBJECTIVE IMPAIRMENTS: decreased mobility, decreased ROM, decreased strength, impaired UE functional use, and pain.     GOALS: Goals reviewed with patient? Yes  SHORT TERM GOALS: Target date: 08/09/23 not updated at recert/goal already met   Patient will be independent with initial HEP.  Baseline: given 06/28/23  Goal status: MET   LONG TERM GOALS: Target date: 11/23/2023    Patient will be independent with advanced/ongoing HEP to improve outcomes and carryover.  Baseline:  Goal status: progressing 09/14/23  evolving 09/20/23  2.  Patient will report 75% improvement in R shoulder pain to improve QOL.  Baseline: 7/10 Goal status: 09/14/23 MET 75%  3.  Patient to improve R shoulder AROM to [facility] without pain provocation to allow for increased ease of ADLs.  Baseline: see chart Goal status: progressing 11/01/23  4.  Patient will demonstrate improved functional UE strength as demonstrated by 4/5 or better. Baseline: see chart Goal status: progressing 11/01/23  5.  Patient will  be able to put a shirt on over her head.  Baseline: stopped trying Goal status: progressing 11/01/23    PLAN:  PT FREQUENCY: 2x/week  PT DURATION: 8 weeks (to account for back dated visits)  PLANNED INTERVENTIONS: 97110-Therapeutic exercises, 97530- Therapeutic activity, 97112- Neuromuscular re-education, 97535- Self Care, 16109- Manual therapy, Patient/Family education, Taping, Dry Needling, Joint mobilization, Spinal mobilization, Cryotherapy, and Moist heat  PLAN FOR NEXT SESSION: Progress strength and func as she fatigues easily and fatigues quickly once over 90 degrees, progress as tolerated- was out sick for a couple weeks and still malaised/definitely lost some strength and potentially some ROM     Nedra Hai, PT, DPT 11/01/23 3:15 PM

## 2023-11-02 ENCOUNTER — Telehealth: Payer: Self-pay | Admitting: Neurology

## 2023-11-02 NOTE — Telephone Encounter (Signed)
 no auth required for [facility] or tricare sent to [employer] [phone]

## 2023-11-03 ENCOUNTER — Ambulatory Visit: Payer: Medicare PPO | Admitting: Physical Therapy

## 2023-11-03 DIAGNOSIS — M25611 Stiffness of right shoulder, not elsewhere classified: Secondary | ICD-10-CM | POA: Diagnosis not present

## 2023-11-03 DIAGNOSIS — G8929 Other chronic pain: Secondary | ICD-10-CM | POA: Diagnosis not present

## 2023-11-03 DIAGNOSIS — M25512 Pain in left shoulder: Secondary | ICD-10-CM | POA: Diagnosis not present

## 2023-11-03 DIAGNOSIS — M25511 Pain in right shoulder: Secondary | ICD-10-CM | POA: Diagnosis not present

## 2023-11-03 DIAGNOSIS — M6281 Muscle weakness (generalized): Secondary | ICD-10-CM | POA: Diagnosis not present

## 2023-11-03 NOTE — Therapy (Signed)
 OUTPATIENT PHYSICAL THERAPY SHOULDER TREATMENT    Patient Name: Christy [name] MRN: [id number] DOB:07-07-1941, 83 y.o., female Today's Date: 11/03/2023  END OF SESSION:  PT End of Session - 11/03/23 1446     Visit Number 17    Date for PT Re-Evaluation 11/23/23    Authorization Type [employer]    PT Start Time 1447    PT Stop Time 1530    PT Time Calculation (min) 43 min                Past Medical History:  Diagnosis Date   Agatston coronary artery calcium score greater than 400    Ca score 1121 in July 2022   Allergy    Arthritis    Chronic constipation    Depression    Diabetes mellitus without complication (HCC)    History of breast cancer    HLD (hyperlipidemia)    PAT (paroxysmal atrial tachycardia) (HCC)    Ziopatch 02/2021   Pulmonary insufficiency    moderate by echo 11/23   Rheumatoid arthritis (HCC)    Past Surgical History:  Procedure Laterality Date   ABDOMINAL HYSTERECTOMY     ACHILLES TENDON REPAIR     BREAST REDUCTION SURGERY Left    CATARACT EXTRACTION     masectomy Right    TONSILLECTOMY AND ADENOIDECTOMY     Patient Active Problem List   Diagnosis Date Noted   Lip swelling 09/28/2022   Cognitive decline 09/02/2022   Insomnia 09/02/2022   Hearing loss 09/02/2022   Acute pain of left knee 05/10/2022   Rheumatoid arthritis of right foot (HCC) 03/31/2022   Type 2 diabetes mellitus with diabetic neuropathy, without long-term current use of insulin (HCC) 03/31/2022   Anxiety and depression 03/31/2022   Diarrhea of presumed infectious origin 03/31/2022   History of breast cancer 03/31/2022   Pulmonary insufficiency 05/13/2021   PAT (paroxysmal atrial tachycardia) (HCC) 04/14/2021   Agatston coronary artery calcium score greater than 400 04/14/2021   HLD (hyperlipidemia) 04/14/2021    PCP: Christy [name]  REFERRING PROVIDER: Madelyn [name]  REFERRING DIAG: chronic bilateral shoulder pain, right shoulder arthritis   THERAPY DIAG:  Chronic  right shoulder pain  Muscle weakness (generalized)  Rationale for Evaluation and Treatment: Rehabilitation  ONSET DATE: "six months"  SUBJECTIVE:                                                                                                                                                                                      SUBJECTIVE STATEMENT: saw MD and running a lot of test including DAT scan,checking for possible Parkinsons. Ankle is sore. Dizzy this afternoon. Shoulders are okay-least of  my worries  Feel "meh". Not much is going on. Shoulder is better, knee and foot hurt this morning but its better now. Seeing Christy [name] next month.   Hand dominance: Right  PERTINENT HISTORY: Christy [name] is a pleasant 83 y.o. female who presents today for bilateral, right greater than left shoulder pain. Right shoulder is the one that has been giving her issues for many months, left 1 only bothers her very intermittently and is not as bothersome. She did receive decent relief from subacromial joint injection but only lasted for a week or 2 and then her pain returned. She would like to start formalized physical therapy. She is on methotrexate 2.5 mg daily for her rheumatoid arthritis. Has used Tylenol in the past but no consistent pain medication.   Pertinent ROS were reviewed with the patient and found to be negative unless otherwise specified above in HPI.   PAIN:  Are you having pain? Yes: NPRS scale: 2/10 Pain location: R shoulder   Pain description: just sore  Aggravating factors: reaching overhead, cooking sometimes especially if having to reach in the cupboards Relieving factors: not sure/random   PRECAUTIONS: None  RED FLAGS: None   WEIGHT BEARING RESTRICTIONS: No  FALLS:  Has patient fallen in last 6 months? No  LIVING ENVIRONMENT: Lives with: lives with their family Lives in: House/apartment Stairs: No   PLOF: Independent and Independent with basic ADLs  PATIENT  GOALS:to be more normal and have normal movements, not to be concerned with movements hurting  NEXT MD VISIT:   OBJECTIVE:  Note: Objective measures were completed at Evaluation unless otherwise noted.  DIAGNOSTIC FINDINGS:  X-rays show mild to moderate glenohumeral joint space narrowing with  arthritic change.  There is a slightly higher pain indicative of rotator cuff arthropathy.  Mild AC joint arthropathy.  No acute fracture or bony abnormality noted   POSTURE: Rounded shoulders   UPPER EXTREMITY ROM:   Active ROM Right eval Left eval RT standing 09/14/23 RT shld standing R 10/12/23 R 11/01/23  Shoulder flexion 115 w/pain 160 w/pain 150 159 135* 125* seated AROM, 150* AAROM supine  Shoulder extension        Shoulder abduction 80 w/pain WFL 132  142 147* 110*  Shoulder adduction        Shoulder internal rotation 55 w/pain WFL 70 80    Shoulder external rotation 50 w/pain WFL 80 80    Elbow flexion        Elbow extension        Wrist flexion        Wrist extension        Wrist ulnar deviation        Wrist radial deviation        Wrist pronation        Wrist supination        (Blank rows = not tested)  UPPER EXTREMITY MMT:  MMT Right eval Left eval RT/Left 09/14/23  R/L 10/12/23 R 11/01/23  Shoulder flexion 2- 3- 3+/4- 3+/4 3+  Shoulder extension       Shoulder abduction 2- 3- 4- 3+/4 3  Shoulder adduction       Shoulder internal rotation 3+ 3+ 4 4+/4+   Shoulder external rotation 3+ 3+  4 3+/3+   Middle trapezius       Lower trapezius       Elbow flexion       Elbow extension       Wrist flexion  Wrist extension       Wrist ulnar deviation       Wrist radial deviation       Wrist pronation       Wrist supination       Grip strength (lbs)       (Blank rows = not tested)  SHOULDER SPECIAL TESTS: Impingement tests: Neer impingement test: positive , Hawkins/Kennedy impingement test: positive , and Painful arc test: positive  Rotator cuff assessment: Empty  can test: positive   JOINT MOBILITY TESTING:  Increase muscle guarding and pain with PROM   PALPATION:  Some TTP    TODAY'S TREATMENT:                                                                                                                                         DATE:   11/03/23  UBE L 3 3 min fwd/3 min backward Putting/taking 1# on/off 2nd shelf in gym x10 then 2# to 1st shelf 10 x 2# cane OH press with PTA target to encourage increased ROM 2 sets 10 2# func reaching 10 x 2 sets  Yellow wt ball chest press, then OH 10 x seated Seated chest 5# on machine 10 x 2 sets Lat pull down 15 # 2 sets 10      11/01/23  UBE L2x4 min forward/4 min backward  MMT/ROM as above Putting/taking 1# on/off 2nd shelf in gym x10    Supine shoulder AAROM with dowel x15  Chest press with dowel x15 Supine shoulder flexion 1# x15  Sidelying shoulder ABD 1# x15 Sidelying shoulder ER 1# x15  Thoracic extensions with OH reach x10  Thoracic lateral ROM x10 B  Thoracic rotation ROM x10 B     10/27/23  UBE L1 x3 min forward/3 min backward self selected pace  Wall ladder for flexion x2 (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), transitioned to wall pillowcase slides an additional 8 times  Supine shoulder flexion 1# 2x10 Sidelying shoulder ABD 1# 2x10  Sidelying shoulder ER 1# 2x10  Backwards shoulder rolls seated x20     10/12/23   UBE L3 x4 min forward/4 backwards   Education towards goals, POC moving forward   Supine shoulder flexion 2# 2x10  Sidelying shoulder ABD 1# 2x10  Sidelying shoulder ER towel under elbow 2# 2x10  3 way reaches x3 B yellow band     PATIENT EDUCATION: Education details: POC and HEP Person educated: Patient Education method: Explanation Education comprehension: verbalized understanding  HOME EXERCISE PROGRAM: Access Code: [facility] URL: [email or web address] Date: 08/18/2023 Prepared by: Vernon Prey April Kirstie Peri  Exercises - Standing  Shoulder Extension with Dowel  - 1 x daily - 7 x weekly - 2 sets - 10 reps - Standing Bilateral Shoulder Internal Rotation AAROM with Dowel  - 1 x daily - 7 x weekly - 2 sets - 10 reps - Shoulder Internal Rotation with Resistance  -  1 x daily - 7 x weekly - 2 sets - 10 reps - Shoulder External Rotation with Anchored Resistance  - 1 x daily - 7 x weekly - 2 sets - 10 reps - Doorway Pec Stretch at 60 Degrees Abduction with Arm Straight  - 1 x daily - 7 x weekly - 2 sets - 30 sec hold - Seated Bilateral Shoulder Flexion Towel Slide at Table Top  - 1 x daily - 7 x weekly - 2 sets - 10 reps - Seated Scapular Retraction  - 1 x daily - 7 x weekly - 2 sets - 10 reps - Seated Thoracic Extension AROM  - 1 x daily - 7 x weekly - 1 sets - 10 reps  ASSESSMENT:  CLINICAL IMPRESSION:   Pt arrives today still not feeling well, we again had to modify session based on malaise and energy levels. Progressed strength as tolerated with cuing as needed and verb and tactile cuing to increase ROM   OBJECTIVE IMPAIRMENTS: decreased mobility, decreased ROM, decreased strength, impaired UE functional use, and pain.     GOALS: Goals reviewed with patient? Yes  SHORT TERM GOALS: Target date: 08/09/23 not updated at recert/goal already met   Patient will be independent with initial HEP.  Baseline: given 06/28/23  Goal status: MET   LONG TERM GOALS: Target date: 11/23/2023    Patient will be independent with advanced/ongoing HEP to improve outcomes and carryover.  Baseline:  Goal status: progressing 09/14/23  evolving 09/20/23  2.  Patient will report 75% improvement in R shoulder pain to improve QOL.  Baseline: 7/10 Goal status: 09/14/23 MET 75%  3.  Patient to improve R shoulder AROM to [facility] without pain provocation to allow for increased ease of ADLs.  Baseline: see chart Goal status: progressing 11/01/23  4.  Patient will demonstrate improved functional UE strength as demonstrated by 4/5 or  better. Baseline: see chart Goal status: progressing 11/01/23  5.  Patient will be able to put a shirt on over her head.  Baseline: stopped trying Goal status: progressing 11/01/23    PLAN:  PT FREQUENCY: 2x/week  PT DURATION: 8 weeks (to account for back dated visits)  PLANNED INTERVENTIONS: 97110-Therapeutic exercises, 97530- Therapeutic activity, 97112- Neuromuscular re-education, 97535- Self Care, 16109- Manual therapy, Patient/Family education, Taping, Dry Needling, Joint mobilization, Spinal mobilization, Cryotherapy, and Moist heat  PLAN FOR NEXT SESSION: Progress strength and func as she fatigues easily and fatigues quickly once over 90 degrees,     Patient Details  Name: Keria Widrig MRN: [id number] Date of Birth: October 06, 1940 Referring Provider:  Madelyn Brunner, DO  Encounter Date: 11/03/2023   Suanne Marker, PTA 11/03/2023, 2:47 PM  [facility] [address] [place], [place], [address] Phone: [phone]   Fax:  [phone]

## 2023-11-05 LAB — APOE ALZHEIMER'S RISK

## 2023-11-05 LAB — COMPREHENSIVE METABOLIC PANEL WITH GFR
ALT: 10 IU/L (ref 0–32)
AST: 11 IU/L (ref 0–40)
Albumin: 4.1 g/dL (ref 3.7–4.7)
Alkaline Phosphatase: 60 IU/L (ref 44–121)
BUN/Creatinine Ratio: 22 (ref 12–28)
BUN: 20 mg/dL (ref 8–27)
Bilirubin Total: 0.3 mg/dL (ref 0.0–1.2)
CO2: 24 mmol/L (ref 20–29)
Calcium: 9.5 mg/dL (ref 8.7–10.3)
Chloride: 102 mmol/L (ref 96–106)
Creatinine, Ser: 0.9 mg/dL (ref 0.57–1.00)
Globulin, Total: 2.3 g/dL (ref 1.5–4.5)
Glucose: 119 mg/dL — ABNORMAL HIGH (ref 70–99)
Potassium: 4.2 mmol/L (ref 3.5–5.2)
Sodium: 140 mmol/L (ref 134–144)
Total Protein: 6.4 g/dL (ref 6.0–8.5)
eGFR: 64 mL/min/{1.73_m2} (ref >59)

## 2023-11-05 LAB — ATN PROFILE
A -- Beta-amyloid 42/40 Ratio: 0.109 (ref >0.102)
Beta-amyloid 40: 198.05 pg/mL
Beta-amyloid 42: 21.53 pg/mL
N -- NfL, Plasma: 6.92 pg/mL (ref 0.00–11.55)
T -- p-tau181: 1.23 pg/mL — ABNORMAL HIGH (ref 0.00–0.97)

## 2023-11-05 LAB — VITAMIN D 25 HYDROXY (VIT D DEFICIENCY, FRACTURES): Vit D, 25-Hydroxy: 39.5 ng/mL (ref 30.0–100.0)

## 2023-11-09 ENCOUNTER — Encounter: Payer: Self-pay | Admitting: Neurology

## 2023-11-09 NOTE — Telephone Encounter (Signed)
Results seen by pt on my chart

## 2023-11-09 NOTE — Telephone Encounter (Signed)
-----   Message from Huston Foley sent at 11/09/2023 12:12 PM EDT ----- See MyChart message to pt.

## 2023-11-10 ENCOUNTER — Ambulatory Visit: Attending: Family Medicine | Admitting: Physical Therapy

## 2023-11-10 DIAGNOSIS — M25611 Stiffness of right shoulder, not elsewhere classified: Secondary | ICD-10-CM | POA: Insufficient documentation

## 2023-11-10 DIAGNOSIS — M6281 Muscle weakness (generalized): Secondary | ICD-10-CM | POA: Diagnosis not present

## 2023-11-10 DIAGNOSIS — M25511 Pain in right shoulder: Secondary | ICD-10-CM | POA: Insufficient documentation

## 2023-11-10 DIAGNOSIS — M25512 Pain in left shoulder: Secondary | ICD-10-CM | POA: Insufficient documentation

## 2023-11-10 DIAGNOSIS — G8929 Other chronic pain: Secondary | ICD-10-CM | POA: Insufficient documentation

## 2023-11-10 NOTE — Therapy (Signed)
 OUTPATIENT PHYSICAL THERAPY SHOULDER TREATMENT    Patient Name: Christy [name] MRN: [id number] DOB:1941/05/11, 83 y.o., female Today's Date: 11/10/2023  END OF SESSION:  PT End of Session - 11/10/23 1651     Visit Number 18    Date for PT Re-Evaluation 11/23/23    Authorization Type [employer]    PT Start Time 1652    PT Stop Time 1740    PT Time Calculation (min) 48 min                Past Medical History:  Diagnosis Date   Agatston coronary artery calcium score greater than 400    Ca score 1121 in July 2022   Allergy    Arthritis    Chronic constipation    Depression    Diabetes mellitus without complication (HCC)    History of breast cancer    HLD (hyperlipidemia)    PAT (paroxysmal atrial tachycardia) (HCC)    Ziopatch 02/2021   Pulmonary insufficiency    moderate by echo 11/23   Rheumatoid arthritis (HCC)    Past Surgical History:  Procedure Laterality Date   ABDOMINAL HYSTERECTOMY     ACHILLES TENDON REPAIR     BREAST REDUCTION SURGERY Left    CATARACT EXTRACTION     masectomy Right    TONSILLECTOMY AND ADENOIDECTOMY     Patient Active Problem List   Diagnosis Date Noted   Lip swelling 09/28/2022   Cognitive decline 09/02/2022   Insomnia 09/02/2022   Hearing loss 09/02/2022   Acute pain of left knee 05/10/2022   Rheumatoid arthritis of right foot (HCC) 03/31/2022   Type 2 diabetes mellitus with diabetic neuropathy, without long-term current use of insulin (HCC) 03/31/2022   Anxiety and depression 03/31/2022   Diarrhea of presumed infectious origin 03/31/2022   History of breast cancer 03/31/2022   Pulmonary insufficiency 05/13/2021   PAT (paroxysmal atrial tachycardia) (HCC) 04/14/2021   Agatston coronary artery calcium score greater than 400 04/14/2021   HLD (hyperlipidemia) 04/14/2021    PCP: Carilyn Goodpasture  REFERRING PROVIDER: Madelyn Brunner  REFERRING DIAG: chronic bilateral shoulder pain, right shoulder arthritis   THERAPY DIAG:  Chronic  right shoulder pain  Muscle weakness (generalized)  Stiffness of right shoulder, not elsewhere classified  Rationale for Evaluation and Treatment: Rehabilitation  ONSET DATE: "six months"  SUBJECTIVE:                                                                                                                                                                                      SUBJECTIVE STATEMENT: DAT scan tomorrow, worried. Pain overall 80% better, not waking me at night. Able to use  arms more and do more. Dizziness still an issue  Hand dominance: Right  PERTINENT HISTORY: Christy [name] is a pleasant 83 y.o. female who presents today for bilateral, right greater than left shoulder pain. Right shoulder is the one that has been giving her issues for many months, left 1 only bothers her very intermittently and is not as bothersome. She did receive decent relief from subacromial joint injection but only lasted for a week or 2 and then her pain returned. She would like to start formalized physical therapy. She is on methotrexate 2.5 mg daily for her rheumatoid arthritis. Has used Tylenol in the past but no consistent pain medication.   Pertinent ROS were reviewed with the patient and found to be negative unless otherwise specified above in HPI.   PAIN:  Are you having pain? Yes: NPRS scale: 2/10 Pain location: R shoulder   Pain description: just sore  Aggravating factors: reaching overhead, cooking sometimes especially if having to reach in the cupboards Relieving factors: not sure/random   PRECAUTIONS: None  RED FLAGS: None   WEIGHT BEARING RESTRICTIONS: No  FALLS:  Has patient fallen in last 6 months? No  LIVING ENVIRONMENT: Lives with: lives with their family Lives in: House/apartment Stairs: No   PLOF: Independent and Independent with basic ADLs  PATIENT GOALS:to be more normal and have normal movements, not to be concerned with movements hurting  NEXT MD VISIT:    OBJECTIVE:  Note: Objective measures were completed at Evaluation unless otherwise noted.  DIAGNOSTIC FINDINGS:  X-rays show mild to moderate glenohumeral joint space narrowing with  arthritic change.  There is a slightly higher pain indicative of rotator cuff arthropathy.  Mild AC joint arthropathy.  No acute fracture or bony abnormality noted   POSTURE: Rounded shoulders   UPPER EXTREMITY ROM:   Active ROM Right eval Left eval RT standing 09/14/23 RT shld standing R 10/12/23 RT 11/10/23 R 11/01/23  Shoulder flexion 115 w/pain 160 w/pain 150 159 135* 145 125* seated AROM, 150* AAROM supine  Shoulder extension         Shoulder abduction 80 w/pain WFL 132  142 147* 150 110*  Shoulder adduction         Shoulder internal rotation 55 w/pain WFL 70 80     Shoulder external rotation 50 w/pain WFL 80 80     Elbow flexion         Elbow extension         Wrist flexion         Wrist extension         Wrist ulnar deviation         Wrist radial deviation         Wrist pronation         Wrist supination         (Blank rows = not tested)  UPPER EXTREMITY MMT:  MMT Right eval Left eval RT/Left 09/14/23  R/L 10/12/23 R 11/01/23 RT 11/10/23  Shoulder flexion 2- 3- 3+/4- 3+/4 3+ 4  Shoulder extension        Shoulder abduction 2- 3- 4- 3+/4 3 4   Shoulder adduction        Shoulder internal rotation 3+ 3+ 4 4+/4+  4+  Shoulder external rotation 3+ 3+  4 3+/3+  4  Middle trapezius        Lower trapezius        Elbow flexion        Elbow extension  Wrist flexion        Wrist extension        Wrist ulnar deviation        Wrist radial deviation        Wrist pronation        Wrist supination        Grip strength (lbs)        (Blank rows = not tested)  SHOULDER SPECIAL TESTS: Impingement tests: Neer impingement test: positive , Hawkins/Kennedy impingement test: positive , and Painful arc test: positive  Rotator cuff assessment: Empty can test: positive   JOINT MOBILITY TESTING:   Increase muscle guarding and pain with PROM   PALPATION:  Some TTP    TODAY'S TREATMENT:                                                                                                                                         DATE:   11/10/23 UBE L 3 2 min each way Nustep L 3 standing push and pull 2 min then horz abd 2 min Seated yellow ball chest press 10 x then OH 10 x 2# cane OH press with PTA target to encourage increased ROM 2 sets 10 2# func reaching 10 x 2 sets  Yellow tband ABD 2 sets 10 4# BIL shld circles 10 x each 5# farmers carry 1 laps each hand Seated row 15# 2 sets 10 Lat pul down 15# 2 sets 10    11/03/23  UBE L 3 3 min fwd/3 min backward Putting/taking 1# on/off 2nd shelf in gym x10 then 2# to 1st shelf 10 x 2# cane OH press with PTA target to encourage increased ROM 2 sets 10 2# func reaching 10 x 2 sets  Yellow wt ball chest press, then OH 10 x seated Seated chest 5# on machine 10 x 2 sets Lat pull down 15 # 2 sets 10      11/01/23  UBE L2x4 min forward/4 min backward  MMT/ROM as above Putting/taking 1# on/off 2nd shelf in gym x10    Supine shoulder AAROM with dowel x15  Chest press with dowel x15 Supine shoulder flexion 1# x15  Sidelying shoulder ABD 1# x15 Sidelying shoulder ER 1# x15  Thoracic extensions with OH reach x10  Thoracic lateral ROM x10 B  Thoracic rotation ROM x10 B     10/27/23  UBE L1 x3 min forward/3 min backward self selected pace  Wall ladder for flexion x2 (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), transitioned to wall pillowcase slides an additional 8 times  Supine shoulder flexion 1# 2x10 Sidelying shoulder ABD 1# 2x10  Sidelying shoulder ER 1# 2x10  Backwards shoulder rolls seated x20     10/12/23   UBE L3 x4 min forward/4 backwards   Education towards goals, POC moving forward   Supine shoulder flexion 2# 2x10  Sidelying shoulder ABD 1# 2x10  Sidelying shoulder  ER towel under elbow 2# 2x10  3 way reaches x3 B  yellow band     PATIENT EDUCATION: Education details: POC and HEP Person educated: Patient Education method: Explanation Education comprehension: verbalized understanding  HOME EXERCISE PROGRAM: Access Code: [facility] URL: [email or web address] Date: 08/18/2023 Prepared by: Vernon Prey April Kirstie Peri  Exercises - Standing Shoulder Extension with Dowel  - 1 x daily - 7 x weekly - 2 sets - 10 reps - Standing Bilateral Shoulder Internal Rotation AAROM with Dowel  - 1 x daily - 7 x weekly - 2 sets - 10 reps - Shoulder Internal Rotation with Resistance  - 1 x daily - 7 x weekly - 2 sets - 10 reps - Shoulder External Rotation with Anchored Resistance  - 1 x daily - 7 x weekly - 2 sets - 10 reps - Doorway Pec Stretch at 60 Degrees Abduction with Arm Straight  - 1 x daily - 7 x weekly - 2 sets - 30 sec hold - Seated Bilateral Shoulder Flexion Towel Slide at Table Top  - 1 x daily - 7 x weekly - 2 sets - 10 reps - Seated Scapular Retraction  - 1 x daily - 7 x weekly - 2 sets - 10 reps - Seated Thoracic Extension AROM  - 1 x daily - 7 x weekly - 1 sets - 10 reps  ASSESSMENT:  CLINICAL IMPRESSION: assessed ROM/MMT and goals. Pt is showing progress with increased ROM and improved strength by testing as well as ability to increase wt and increase quality of mvmt with wt. Verb and tactile cuing needed to decrease compensation.     OBJECTIVE IMPAIRMENTS: decreased mobility, decreased ROM, decreased strength, impaired UE functional use, and pain.     GOALS: Goals reviewed with patient? Yes  SHORT TERM GOALS: Target date: 08/09/23 not updated at recert/goal already met   Patient will be independent with initial HEP.  Baseline: given 06/28/23  Goal status: MET   LONG TERM GOALS: Target date: 11/23/2023    Patient will be independent with advanced/ongoing HEP to improve outcomes and carryover.  Baseline:  Goal status: progressing 09/14/23  evolving 09/20/23  2.  Patient  will report 75% improvement in R shoulder pain to improve QOL.  Baseline: 7/10 Goal status: 09/14/23 MET 75%  3.  Patient to improve R shoulder AROM to [facility] without pain provocation to allow for increased ease of ADLs.  Baseline: see chart Goal status: progressing 11/01/23  and 11/10/23  4.  Patient will demonstrate improved functional UE strength as demonstrated by 4/5 or better. Baseline: see chart Goal status: progressing 11/01/23  and 11/10/23  5.  Patient will be able to put a shirt on over her head.  Baseline: stopped trying Goal status: progressing 11/01/23 and 11/10/23    PLAN:  PT FREQUENCY: 2x/week  PT DURATION: 8 weeks (to account for back dated visits)  PLANNED INTERVENTIONS: 97110-Therapeutic exercises, 97530- Therapeutic activity, 97112- Neuromuscular re-education, 97535- Self Care, 10272- Manual therapy, Patient/Family education, Taping, Dry Needling, Joint mobilization, Spinal mobilization, Cryotherapy, and Moist heat  PLAN FOR NEXT SESSION: Progress strength and func as she fatigues easily and fatigues quickly once over 90 degrees,     Patient Details  Name: Christy [name] MRN: [id number] Date of Birth: 1941-07-20 Referring Provider:  Carilyn Goodpasture, NP  Encounter Date: 11/10/2023   Suanne Marker, PTA 11/10/2023, 4:54 PM  [facility] [address] [place], [place], [address] Phone: [phone]   Fax:  [phone] [address] [place], [place], [address] Phone: [phone]   Fax:  [phone]  Patient Details  Name: Christy [name] MRN: [id number] Date of Birth: 09-28-1940 Referring Provider:  Carilyn Goodpasture, NP

## 2023-11-11 ENCOUNTER — Encounter (HOSPITAL_COMMUNITY)
Admission: RE | Admit: 2023-11-11 | Discharge: 2023-11-11 | Disposition: A | Discharge status: Home / Self Care | Source: Ambulatory Visit | Attending: Neurology | Admitting: Neurology

## 2023-11-11 DIAGNOSIS — G20C Parkinsonism, unspecified: Secondary | ICD-10-CM

## 2023-11-11 DIAGNOSIS — Z9181 History of falling: Secondary | ICD-10-CM

## 2023-11-11 DIAGNOSIS — R413 Other amnesia: Secondary | ICD-10-CM | POA: Diagnosis not present

## 2023-11-11 DIAGNOSIS — R269 Unspecified abnormalities of gait and mobility: Secondary | ICD-10-CM

## 2023-11-11 DIAGNOSIS — R5383 Other fatigue: Secondary | ICD-10-CM | POA: Diagnosis not present

## 2023-11-11 DIAGNOSIS — R2689 Other abnormalities of gait and mobility: Secondary | ICD-10-CM | POA: Diagnosis not present

## 2023-11-11 MED ORDER — POTASSIUM IODIDE (ANTIDOTE) 130 MG PO TABS
ORAL_TABLET | ORAL | Status: AC
  Filled 2023-11-11: qty 1

## 2023-11-11 MED ORDER — IOFLUPANE I 123 185 MBQ/2.5ML IV SOLN
4.2400 | Freq: Once | INTRAVENOUS | Status: AC | PRN
Start: 2023-11-11 — End: 2023-11-11
  Administered 2023-11-11: 4.24 via INTRAVENOUS

## 2023-11-14 ENCOUNTER — Telehealth: Payer: Self-pay | Admitting: *Deleted

## 2023-11-14 NOTE — Telephone Encounter (Signed)
-----   Message from Huston Foley sent at 11/14/2023 10:49 AM EDT ----- Please call patient or family member on DPR about the recent DaT scan: This is a specialized brain scan designed to help with diagnosis of tremor disorders. A radioactive marker gets injected and the uptake is measured in the brain and compared to normal controls and right side is compared to the left, a change in uptake can help with diagnosis of certain tremor disorders. 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  She can follow up as scheduled.   Huston Foley, MD, PhD [facility])

## 2023-11-14 NOTE — Telephone Encounter (Signed)
 I called pt and relayed the results of the DAT scan listed per Dr. Frances Furbish below.  Pt had questions, and wanted to be seen sooner then July 2025.  I mover her appt up to 12-01-2023 at 1415 arrive 1400.

## 2023-11-16 ENCOUNTER — Ambulatory Visit: Admitting: Physical Therapy

## 2023-11-16 ENCOUNTER — Encounter: Payer: Self-pay | Admitting: Physical Therapy

## 2023-11-16 DIAGNOSIS — M6281 Muscle weakness (generalized): Secondary | ICD-10-CM

## 2023-11-16 DIAGNOSIS — M25611 Stiffness of right shoulder, not elsewhere classified: Secondary | ICD-10-CM | POA: Diagnosis not present

## 2023-11-16 DIAGNOSIS — G8929 Other chronic pain: Secondary | ICD-10-CM | POA: Diagnosis not present

## 2023-11-16 DIAGNOSIS — M25512 Pain in left shoulder: Secondary | ICD-10-CM

## 2023-11-16 DIAGNOSIS — M25511 Pain in right shoulder: Secondary | ICD-10-CM

## 2023-11-16 NOTE — Therapy (Signed)
 OUTPATIENT PHYSICAL THERAPY SHOULDER TREATMENT    Patient Name: Christy [name] MRN: [id number] DOB:1940-10-06, 83 y.o., female Today's Date: 11/16/2023  END OF SESSION:  PT End of Session - 11/16/23 1532     Visit Number 19    Authorization Type [employer]    PT Start Time 1519    PT Stop Time 1558    PT Time Calculation (min) 39 min    Activity Tolerance Patient tolerated treatment well    Behavior During Therapy [facility] for tasks assessed/performed                 Past Medical History:  Diagnosis Date   Agatston coronary artery calcium score greater than 400    Ca score 1121 in July 2022   Allergy    Arthritis    Chronic constipation    Depression    Diabetes mellitus without complication (HCC)    History of breast cancer    HLD (hyperlipidemia)    PAT (paroxysmal atrial tachycardia) (HCC)    Ziopatch 02/2021   Pulmonary insufficiency    moderate by echo 11/23   Rheumatoid arthritis (HCC)    Past Surgical History:  Procedure Laterality Date   ABDOMINAL HYSTERECTOMY     ACHILLES TENDON REPAIR     BREAST REDUCTION SURGERY Left    CATARACT EXTRACTION     masectomy Right    TONSILLECTOMY AND ADENOIDECTOMY     Patient Active Problem List   Diagnosis Date Noted   Lip swelling 09/28/2022   Cognitive decline 09/02/2022   Insomnia 09/02/2022   Hearing loss 09/02/2022   Acute pain of left knee 05/10/2022   Rheumatoid arthritis of right foot (HCC) 03/31/2022   Type 2 diabetes mellitus with diabetic neuropathy, without long-term current use of insulin (HCC) 03/31/2022   Anxiety and depression 03/31/2022   Diarrhea of presumed infectious origin 03/31/2022   History of breast cancer 03/31/2022   Pulmonary insufficiency 05/13/2021   PAT (paroxysmal atrial tachycardia) (HCC) 04/14/2021   Agatston coronary artery calcium score greater than 400 04/14/2021   HLD (hyperlipidemia) 04/14/2021    PCP: Carilyn Goodpasture  REFERRING PROVIDER: Madelyn Brunner  REFERRING DIAG: chronic  bilateral shoulder pain, right shoulder arthritis   THERAPY DIAG:  Chronic right shoulder pain  Muscle weakness (generalized)  Stiffness of right shoulder, not elsewhere classified  Chronic left shoulder pain  Rationale for Evaluation and Treatment: Rehabilitation  ONSET DATE: "six months"  SUBJECTIVE:                                                                                                                                                                                      SUBJECTIVE  STATEMENT:   Shoulder is feeling OK but hand is really stiff here recently. 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 I think it would be best for me to    Hand dominance: Right  PERTINENT HISTORY: Christy [name] is a pleasant 83 y.o. female who presents today for bilateral, right greater than left shoulder pain. Right shoulder is the one that has been giving her issues for many months, left 1 only bothers her very intermittently and is not as bothersome. She did receive decent relief from subacromial joint injection but only lasted for a week or 2 and then her pain returned. She would like to start formalized physical therapy. She is on methotrexate 2.5 mg daily for her rheumatoid arthritis. Has used Tylenol in the past but no consistent pain medication.   Pertinent ROS were reviewed with the patient and found to be negative unless otherwise specified above in HPI.   PAIN:  Are you having pain? Yes: NPRS scale: 2-3/10 Pain location: R shoulder   Pain description: just sore  Aggravating factors: reaching overhead, cooking sometimes especially if having to reach in the cupboards Relieving factors: not sure/random   PRECAUTIONS: None  RED FLAGS: None   WEIGHT BEARING RESTRICTIONS: No  FALLS:  Has patient fallen in last 6 months? No  LIVING ENVIRONMENT: Lives with: lives with their family Lives in:  House/apartment Stairs: No   PLOF: Independent and Independent with basic ADLs  PATIENT GOALS:to be more normal and have normal movements, not to be concerned with movements hurting  NEXT MD VISIT:   OBJECTIVE:  Note: Objective measures were completed at Evaluation unless otherwise noted.  DIAGNOSTIC FINDINGS:  X-rays show mild to moderate glenohumeral joint space narrowing with  arthritic change.  There is a slightly higher pain indicative of rotator cuff arthropathy.  Mild AC joint arthropathy.  No acute fracture or bony abnormality noted   POSTURE: Rounded shoulders   UPPER EXTREMITY ROM:   Active ROM Right eval Left eval RT standing 09/14/23 RT shld standing R 10/12/23 RT 11/10/23 R 11/01/23  Shoulder flexion 115 w/pain 160 w/pain 150 159 135* 145 125* seated AROM, 150* AAROM supine  Shoulder extension         Shoulder abduction 80 w/pain WFL 132  142 147* 150 110*  Shoulder adduction         Shoulder internal rotation 55 w/pain WFL 70 80     Shoulder external rotation 50 w/pain WFL 80 80     Elbow flexion         Elbow extension         Wrist flexion         Wrist extension         Wrist ulnar deviation         Wrist radial deviation         Wrist pronation         Wrist supination         (Blank rows = not tested)  UPPER EXTREMITY MMT:  MMT Right eval Left eval RT/Left 09/14/23  R/L 10/12/23 R 11/01/23 RT 11/10/23  Shoulder flexion 2- 3- 3+/4- 3+/4 3+ 4  Shoulder extension        Shoulder abduction 2- 3- 4- 3+/4 3 4   Shoulder adduction        Shoulder internal rotation 3+ 3+ 4 4+/4+  4+  Shoulder external rotation 3+ 3+  4 3+/3+  4  Middle trapezius        Lower trapezius        Elbow flexion        Elbow extension        Wrist flexion        Wrist extension        Wrist ulnar deviation        Wrist radial deviation        Wrist pronation        Wrist supination        Grip strength (lbs)        (Blank rows = not tested)  SHOULDER SPECIAL  TESTS: Impingement tests: Neer impingement test: positive , Hawkins/Kennedy impingement test: positive , and Painful arc test: positive  Rotator cuff assessment: Empty can test: positive   JOINT MOBILITY TESTING:  Increase muscle guarding and pain with PROM   PALPATION:  Some TTP    TODAY'S TREATMENT:                                                                                                                                         DATE:   11/16/23   UBE L4x3 min forward/3 min backward 3# shoulder flexion (wt on cane) 2x10 in available ROM Farmers carry with 5# 2x280ft switching hands each lap  Attempted body blade- 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 Wall ball diagonal taps yellow wted ball 2x10  Rows 15# 1x12  Lat pulls 15# 1x12 mod cues for good form   Education on potential PT interventions if her MD were to formally diagnose her, PT role in working on gait and mobility as well as role of exercise and meds in combination to help manage symptoms long term     11/10/23 UBE L 3 2 min each way Nustep L 3 standing push and pull 2 min then horz abd 2 min Seated yellow ball chest press 10 x then OH 10 x 2# cane OH press with PTA target to encourage increased ROM 2 sets 10 2# func reaching 10 x 2 sets  Yellow tband ABD 2 sets 10 4# BIL shld circles 10 x each 5# farmers carry 1 laps each hand Seated row 15# 2 sets 10 Lat pul down 15# 2 sets 10    11/03/23  UBE L 3 3 min fwd/3 min backward Putting/taking 1# on/off 2nd shelf in gym x10 then 2# to 1st shelf 10 x 2# cane OH press with PTA target to encourage increased ROM 2 sets 10 2# func reaching 10 x 2 sets  Yellow wt ball chest press, then OH 10 x seated Seated chest 5# on machine 10 x 2 sets Lat pull down 15 # 2 sets 10      PATIENT EDUCATION: Education details: POC and HEP Person educated: Patient Education method: Explanation Education comprehension: verbalized understanding  HOME EXERCISE PROGRAM: Access Code:  [place] URL: [email or web address] Date: 08/18/2023 Prepared by: Vernon Prey April Kirstie Peri  Exercises - Standing Shoulder Extension with Dowel  - 1 x daily - 7 x weekly - 2 sets - 10 reps - Standing Bilateral Shoulder Internal Rotation AAROM with Dowel  - 1 x daily - 7 x weekly - 2 sets - 10 reps - Shoulder Internal Rotation with Resistance  - 1 x daily - 7 x weekly - 2 sets - 10 reps - Shoulder External Rotation with Anchored Resistance  - 1 x daily - 7 x weekly - 2 sets - 10 reps - Doorway Pec Stretch at 60 Degrees Abduction with Arm Straight  - 1 x daily - 7 x weekly - 2 sets - 30 sec hold - Seated Bilateral Shoulder Flexion Towel Slide at Table Top  - 1 x daily - 7 x weekly - 2 sets - 10 reps - Seated Scapular Retraction  - 1 x daily - 7 x weekly - 2 sets - 10 reps - Seated Thoracic Extension AROM  - 1 x daily - 7 x weekly - 1 sets - 10 reps  ASSESSMENT:  CLINICAL IMPRESSION:   Pt arrives today doing OK, she did get her DAT results back which showed 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 Frances Furbish in a couple of weeks.  We discussed her POC and were in agreement to drop her frequency to 1x/week prior to DC as she has been feeling very overwhelmed recently. Continued working on ROM and strength as tolerated today, will continue to challenge her in her remaining visits.      OBJECTIVE IMPAIRMENTS: decreased mobility, decreased ROM, decreased strength, impaired UE functional use, and pain.     GOALS: Goals reviewed with patient? Yes  SHORT TERM GOALS: Target date: 08/09/23 not updated at recert/goal already met   Patient will be independent with initial HEP.  Baseline: given 06/28/23  Goal status: MET   LONG TERM GOALS: Target date: 11/23/2023    Patient will be independent with advanced/ongoing HEP to improve outcomes and carryover.  Baseline:  Goal status: progressing 09/14/23  evolving 09/20/23  2.  Patient will report 75%  improvement in R shoulder pain to improve QOL.  Baseline: 7/10 Goal status: 09/14/23 MET 75%  3.  Patient to improve R shoulder AROM to [facility] without pain provocation to allow for increased ease of ADLs.  Baseline: see chart Goal status: progressing 11/01/23  and 11/10/23  4.  Patient will demonstrate improved functional UE strength as demonstrated by 4/5 or better. Baseline: see chart Goal status: progressing 11/01/23  and 11/10/23  5.  Patient will be able to put a shirt on over her head.  Baseline: stopped trying Goal status: progressing 11/01/23 and 11/10/23    PLAN:  PT FREQUENCY: 2x/week  PT DURATION: 8 weeks (to account for back dated visits)  PLANNED INTERVENTIONS: 97110-Therapeutic exercises, 97530- Therapeutic activity, 97112- Neuromuscular re-education, 97535- Self Care, 16109- Manual therapy, Patient/Family education, Taping, Dry Needling, Joint mobilization, Spinal mobilization, Cryotherapy, and Moist heat  PLAN FOR NEXT SESSION: Progress strength and func as she fatigues easily and fatigues quickly once over 90 degrees, 2 more visits then likely DC vs hold as she is feeling overwhelmed by other medical diagnoses     Nedra Hai, PT, DPT 11/16/23 4:01 PM

## 2023-11-17 DIAGNOSIS — E559 Vitamin D deficiency, unspecified: Secondary | ICD-10-CM | POA: Diagnosis not present

## 2023-11-17 DIAGNOSIS — M1991 Primary osteoarthritis, unspecified site: Secondary | ICD-10-CM | POA: Diagnosis not present

## 2023-11-17 DIAGNOSIS — Z6825 Body mass index (BMI) 25.0-25.9, adult: Secondary | ICD-10-CM | POA: Diagnosis not present

## 2023-11-17 DIAGNOSIS — M0579 Rheumatoid arthritis with rheumatoid factor of multiple sites without organ or systems involvement: Secondary | ICD-10-CM | POA: Diagnosis not present

## 2023-11-17 DIAGNOSIS — E663 Overweight: Secondary | ICD-10-CM | POA: Diagnosis not present

## 2023-11-17 DIAGNOSIS — M25511 Pain in right shoulder: Secondary | ICD-10-CM | POA: Diagnosis not present

## 2023-11-17 DIAGNOSIS — R5383 Other fatigue: Secondary | ICD-10-CM | POA: Diagnosis not present

## 2023-11-18 ENCOUNTER — Ambulatory Visit: Admitting: Physical Therapy

## 2023-11-22 ENCOUNTER — Ambulatory Visit: Admitting: Physical Therapy

## 2023-11-24 ENCOUNTER — Ambulatory Visit: Admitting: Physical Therapy

## 2023-11-24 ENCOUNTER — Encounter: Payer: Self-pay | Admitting: Physical Therapy

## 2023-11-24 DIAGNOSIS — M25611 Stiffness of right shoulder, not elsewhere classified: Secondary | ICD-10-CM

## 2023-11-24 DIAGNOSIS — M6281 Muscle weakness (generalized): Secondary | ICD-10-CM | POA: Diagnosis not present

## 2023-11-24 DIAGNOSIS — G8929 Other chronic pain: Secondary | ICD-10-CM | POA: Diagnosis not present

## 2023-11-24 DIAGNOSIS — M25511 Pain in right shoulder: Secondary | ICD-10-CM | POA: Diagnosis not present

## 2023-11-24 DIAGNOSIS — M25512 Pain in left shoulder: Secondary | ICD-10-CM | POA: Diagnosis not present

## 2023-11-24 NOTE — Therapy (Addendum)
 OUTPATIENT PHYSICAL THERAPY SHOULDER TREATMENT    Patient Name: Christy [name] MRN: [id number] DOB:04/06/1941, 83 y.o., female Today's Date: 11/24/2023  END OF SESSION:  PT End of Session - 11/24/23 1531     Visit Number 20    Date for PT Re-Evaluation 11/23/23    Authorization Type [employer]    PT Start Time 1530    PT Stop Time 1615    PT Time Calculation (min) 45 min                 Past Medical History:  Diagnosis Date   Agatston coronary artery calcium score greater than 400    Ca score 1121 in July 2022   Allergy    Arthritis    Chronic constipation    Depression    Diabetes mellitus without complication (HCC)    History of breast cancer    HLD (hyperlipidemia)    PAT (paroxysmal atrial tachycardia) (HCC)    [place] 02/2021   Pulmonary insufficiency    moderate by echo 11/23   Rheumatoid arthritis (HCC)    Past Surgical History:  Procedure Laterality Date   ABDOMINAL HYSTERECTOMY     ACHILLES TENDON REPAIR     BREAST REDUCTION SURGERY Left    CATARACT EXTRACTION     masectomy Right    TONSILLECTOMY AND ADENOIDECTOMY     Patient Active Problem List   Diagnosis Date Noted   Lip swelling 09/28/2022   Cognitive decline 09/02/2022   Insomnia 09/02/2022   Hearing loss 09/02/2022   Acute pain of left knee 05/10/2022   Rheumatoid arthritis of right foot (HCC) 03/31/2022   Type 2 diabetes mellitus with diabetic neuropathy, without long-term current use of insulin (HCC) 03/31/2022   Anxiety and depression 03/31/2022   Diarrhea of presumed infectious origin 03/31/2022   History of breast cancer 03/31/2022   Pulmonary insufficiency 05/13/2021   PAT (paroxysmal atrial tachycardia) (HCC) 04/14/2021   Agatston coronary artery calcium score greater than 400 04/14/2021   HLD (hyperlipidemia) 04/14/2021    PCP: Chyrel Craw  REFERRING PROVIDER: Shauna Del  REFERRING DIAG: chronic bilateral shoulder pain, right shoulder arthritis   THERAPY DIAG:   Chronic right shoulder pain  Muscle weakness (generalized)  Stiffness of right shoulder, not elsewhere classified  Chronic left shoulder pain  Rationale for Evaluation and Treatment: Rehabilitation  ONSET DATE: "six months"  SUBJECTIVE:                                                                                                                                                                                      SUBJECTIVE STATEMENT:  "Shoulders don't seem to be a problem anymore". The  shoulder pain comes and goes from arthritis. I feel like I can be D/C. Seeing MD next week for Parkinson's and they said I may need PT for that.  Hand dominance: Right  PERTINENT HISTORY: Christy [name] is a pleasant 83 y.o. female who presents today for bilateral, right greater than left shoulder pain. Right shoulder is the one that has been giving her issues for many months, left 1 only bothers her very intermittently and is not as bothersome. She did receive decent relief from subacromial joint injection but only lasted for a week or 2 and then her pain returned. She would like to start formalized physical therapy. She is on methotrexate 2.5 mg daily for her rheumatoid arthritis. Has used Tylenol in the past but no consistent pain medication.   Pertinent ROS were reviewed with the patient and found to be negative unless otherwise specified above in HPI.   PAIN:  Are you having pain? Yes: NPRS scale: 2-3/10 Pain location: R shoulder   Pain description: just sore  Aggravating factors: reaching overhead, cooking sometimes especially if having to reach in the cupboards Relieving factors: not sure/random   PRECAUTIONS: None  RED FLAGS: None   WEIGHT BEARING RESTRICTIONS: No  FALLS:  Has patient fallen in last 6 months? No  LIVING ENVIRONMENT: Lives with: lives with their family Lives in: House/apartment Stairs: No   PLOF: Independent and Independent with basic ADLs  PATIENT GOALS:to be  more normal and have normal movements, not to be concerned with movements hurting  NEXT MD VISIT:   OBJECTIVE:  Note: Objective measures were completed at Evaluation unless otherwise noted.  DIAGNOSTIC FINDINGS:  X-rays show mild to moderate glenohumeral joint space narrowing with  arthritic change.  There is a slightly higher pain indicative of rotator cuff arthropathy.  Mild AC joint arthropathy.  No acute fracture or bony abnormality noted   POSTURE: Rounded shoulders   UPPER EXTREMITY ROM:   Active ROM Right eval Left eval RT standing 09/14/23 RT shld standing R 10/12/23 RT 11/10/23 R 11/01/23  Shoulder flexion 115 w/pain 160 w/pain 150 159 135* 145 125* seated AROM, 150* AAROM supine  Shoulder extension         Shoulder abduction 80 w/pain WFL 132  142 147* 150 110*  Shoulder adduction         Shoulder internal rotation 55 w/pain WFL 70 80     Shoulder external rotation 50 w/pain WFL 80 80     Elbow flexion         Elbow extension         Wrist flexion         Wrist extension         Wrist ulnar deviation         Wrist radial deviation         Wrist pronation         Wrist supination         (Blank rows = not tested)  UPPER EXTREMITY MMT:  MMT Right eval Left eval RT/Left 09/14/23  R/L 10/12/23 R 11/01/23 RT 11/10/23  Shoulder flexion 2- 3- 3+/4- 3+/4 3+ 4  Shoulder extension        Shoulder abduction 2- 3- 4- 3+/4 3 4   Shoulder adduction        Shoulder internal rotation 3+ 3+ 4 4+/4+  4+  Shoulder external rotation 3+ 3+  4 3+/3+  4  Middle trapezius        Lower trapezius  Elbow flexion        Elbow extension        Wrist flexion        Wrist extension        Wrist ulnar deviation        Wrist radial deviation        Wrist pronation        Wrist supination        Grip strength (lbs)        (Blank rows = not tested)  SHOULDER SPECIAL TESTS: Impingement tests: Neer impingement test: positive , Hawkins/Kennedy impingement test: positive , and Painful  arc test: positive  Rotator cuff assessment: Empty can test: positive   JOINT MOBILITY TESTING:  Increase muscle guarding and pain with PROM   PALPATION:  Some TTP    TODAY'S TREATMENT:                                                                                                                                           DATE:   11/24/23 UBE L2 each way 2#  shoulder abd and flex 2x10 each Red band ER and IR   Resisted wall taps to targets- yellow band- VCs to keep elbows extended  Yellow band Row and shoulder ext x10 each   11/16/23   UBE L4x3 min forward/3 min backward 3# shoulder flexion (wt on cane) 2x10 in available ROM Farmers carry with 5# 2x252ft switching hands each lap  Attempted body blade- 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 Wall ball diagonal taps yellow wted ball 2x10  Rows 15# 1x12  Lat pulls 15# 1x12 mod cues for good form   Education on potential PT interventions if her MD were to formally diagnose her, PT role in working on gait and mobility as well as role of exercise and meds in combination to help manage symptoms long term     11/10/23 UBE L 3 2 min each way Nustep L 3 standing push and pull 2 min then horz abd 2 min Seated yellow ball chest press 10 x then OH 10 x 2# cane OH press with PTA target to encourage increased ROM 2 sets 10 2# func reaching 10 x 2 sets  Yellow tband ABD 2 sets 10 4# BIL shld circles 10 x each 5# farmers carry 1 laps each hand Seated row 15# 2 sets 10 Lat pul down 15# 2 sets 10    11/03/23  UBE L 3 3 min fwd/3 min backward Putting/taking 1# on/off 2nd shelf in gym x10 then 2# to 1st shelf 10 x 2# cane OH press with PTA target to encourage increased ROM 2 sets 10 2# func reaching 10 x 2 sets  Yellow wt ball chest press, then OH 10 x seated Seated chest 5# on machine 10 x 2 sets Lat pull down 15 # 2 sets 10  PATIENT EDUCATION: Education details: POC and HEP Person educated: Patient Education method:  Explanation Education comprehension: verbalized understanding  HOME EXERCISE PROGRAM: Access Code: [facility] URL: [email or web address] Date: 08/18/2023 Prepared by: Vernon Prey April Kirstie Peri  Exercises - Standing Shoulder Extension with Dowel  - 1 x daily - 7 x weekly - 2 sets - 10 reps - Standing Bilateral Shoulder Internal Rotation AAROM with Dowel  - 1 x daily - 7 x weekly - 2 sets - 10 reps - Shoulder Internal Rotation with Resistance  - 1 x daily - 7 x weekly - 2 sets - 10 reps - Shoulder External Rotation with Anchored Resistance  - 1 x daily - 7 x weekly - 2 sets - 10 reps - Doorway Pec Stretch at 60 Degrees Abduction with Arm Straight  - 1 x daily - 7 x weekly - 2 sets - 30 sec hold - Seated Bilateral Shoulder Flexion Towel Slide at Table Top  - 1 x daily - 7 x weekly - 2 sets - 10 reps - Seated Scapular Retraction  - 1 x daily - 7 x weekly - 2 sets - 10 reps - Seated Thoracic Extension AROM  - 1 x daily - 7 x weekly - 1 sets - 10 reps  ASSESSMENT:  CLINICAL IMPRESSION:   Pt states shld are good and she feels she does not need PT currently for shld but that may change as she gets flares with arthritis. Pt seeing MD next week for Parkinson issues and states they mentioned PT. Pt pleased with currently func level and goals assessed . Will D/C and if she needs to return will get new script.      OBJECTIVE IMPAIRMENTS: decreased mobility, decreased ROM, decreased strength, impaired UE functional use, and pain.     GOALS: Goals reviewed with patient? Yes  SHORT TERM GOALS: Target date: 08/09/23 not updated at recert/goal already met   Patient will be independent with initial HEP.  Baseline: given 06/28/23  Goal status: MET   LONG TERM GOALS: Target date: 11/23/2023    Patient will be independent with advanced/ongoing HEP to improve outcomes and carryover.  Baseline:  Goal status: progressing 09/14/23  evolving 09/20/23  11/24/23 MET  2.  Patient will  report 75% improvement in R shoulder pain to improve QOL.  Baseline: 7/10 Goal status: 09/14/23 MET 75%  3.  Patient to improve R shoulder AROM to [facility] without pain provocation to allow for increased ease of ADLs.  Baseline: see chart Goal status: progressing 11/01/23  and 11/10/23  11/24/23 pleased with func level  4.  Patient will demonstrate improved functional UE strength as demonstrated by 4/5 or better. Baseline: see chart Goal status: progressing 11/01/23  and 11/10/23   11/24/23 MET but fatigues quickly  5.  Patient will be able to put a shirt on over her head.  Baseline: stopped trying Goal status: progressing 11/01/23 and 11/10/23  still varies 11/24/23    PLAN:  PT FREQUENCY: 2x/week  PT DURATION: 8 weeks (to account for back dated visits)  PLANNED INTERVENTIONS: 97110-Therapeutic exercises, 97530- Therapeutic activity, 97112- Neuromuscular re-education, 97535- Self Care, 95621- Manual therapy, Patient/Family education, Taping, Dry Needling, Joint mobilization, Spinal mobilization, Cryotherapy, and Moist heat  PLAN  D/C at pt request  PHYSICAL THERAPY DISCHARGE SUMMARY   Patient agrees to discharge. Patient goals were partially met. Patient is being discharged due to being pleased with the current functional level.     Patient Details  Name: Dosia Yodice MRN: [id number]  Date of Birth: 08/28/1940 Referring Provider:  Chyrel Craw, NP  Encounter Date: 11/24/2023  Donavon Fudge, PT, DPT Laurelyn Ponder 11/24/2023, 3:32 PM  [facility] [address] [place], [place], [address] Phone: [phone]   Fax:  [phone]

## 2023-11-29 ENCOUNTER — Ambulatory Visit: Admitting: Physical Therapy

## 2023-12-01 ENCOUNTER — Ambulatory Visit (INDEPENDENT_AMBULATORY_CARE_PROVIDER_SITE_OTHER): Admitting: Neurology

## 2023-12-01 ENCOUNTER — Ambulatory Visit: Admitting: Physical Therapy

## 2023-12-01 ENCOUNTER — Encounter: Payer: Self-pay | Admitting: Neurology

## 2023-12-01 VITALS — BP 117/63 | HR 68 | Ht 63.0 in | Wt 147.6 lb

## 2023-12-01 DIAGNOSIS — R5383 Other fatigue: Secondary | ICD-10-CM | POA: Diagnosis not present

## 2023-12-01 DIAGNOSIS — G20C Parkinsonism, unspecified: Secondary | ICD-10-CM | POA: Diagnosis not present

## 2023-12-01 DIAGNOSIS — R413 Other amnesia: Secondary | ICD-10-CM

## 2023-12-01 NOTE — Progress Notes (Signed)
 Subjective:    Patient ID: Christy [name] is a 83 y.o. female.  HPI    Interim history:   [name] is an 83 year old female with an underlying medical history of rheumatoid arthritis, allergies, chronic constipation, hyperlipidemia, tachycardia, pulmonary insufficiency, diabetes, history of breast cancer with status post right mastectomy, and memory loss, who presents for follow-up consultation of her memory loss and parkinsonism.  The patient is accompanied by her husband and son today.  I first met them in March 2025 at which time she transitioned her care from previously seeing Christy [name] in this office.  Her MoCA was 18 out of 30 at the time.  On examination she had some evidence of parkinsonism.   We proceeded with additional testing including Alzheimer's markers which were not increased.  She had a brain DaTscan  on 11/11/2023 and I reviewed the results:    IMPRESSION:  Bilateral decreased activity in the putamen is a pattern suggestive of Parkinson's syndrome pathology.   Of note, DaTSCAN  is not diagnostic of Parkinsonian syndromes, which remains a clinical diagnosis. DaTscan  is an adjuvant test to aid in the clinical diagnosis of Parkinsonian syndromes.   Today, 12/01/2023: She reports feeling about the same, no recent falls.  She complains of fatigue.  She has lack of energy, she has lost weight over the past few months.  She tries to eat healthy.  She tries to hydrate well with water.  She has had some cognitive complaints.  Her son and her husband supplement her history.  Her son had noticed difficulty with her balance particularly with turns some 6 months ago.  The patient's allergies, current medications, family history, past medical history, past social history, past surgical history and problem list were reviewed and updated as appropriate.    Previously:  10/31/2023: She was previously followed in this clinic by Christy [name] and was evaluated originally in  September 2024.  I reviewed the office visit note and copied the note below for reference.  Her MoCA score was 24 at the time.  She was encouraged to talk to PCP about restarting her antidepressant.  Patient's vitamin B12 level was normal in September 2024 at 543, TSH normal at 1.8.     She had a brain MRI with and without contrast on 06/12/2023 and I reviewed the results:     IMPRESSION: This MRI of the brain with and without contrast shows the following: 1. There are no acute findings.  Normal enhancement pattern. 2. Mild to moderate generalized cortical atrophy, mildly more pronounced in the medial temporal lobes.  Mild corpus callosal and cerebellar atrophy is also noted. 3. Small remote left frontal lobe infarction.   4. scattered T2/FLAIR hyperintense foci in the cerebral hemispheres most consistent with mild chronic microvascular ischemic change, typical for age.   In addition, I personally and independently reviewed images through the PACS system.  They were notified of the test results by phone call.   She reports that her forgetfulness has become worse.  She is primarily bothered by her lack of energy and fatigue.  Her husband endorses that she has a tendency to sleep during the day for extended naps.  She has a good support system.  She has 2 biological children from her first marriage and he has biological children from his prior marriages.  He reports that she does not feel comfortable being by herself for too long and would like for him to come back home sooner than he can sometimes.  She had a fall recently.  She fell in the kitchen, they were washing her hair in the kitchen sink as it is easier for him to help her that way.  She fell backwards and landed on her behind and braced her fall with her hands, did not hit her head.  She sees rheumatology on a regular basis.  She reports that her mom had dementia, she lived to be 24, her dad passed at 7 with lung cancer, she lost her only  sister at the age of 81 with breast cancer.  Her maternal grandmother lived to be 71.  She is a retired Clinical biochemist.  She worked at [employer] and also as a Management consultant in the past.  She is currently weaning off of Prozac as she had side effects.  She is a non-smoker and does not drink alcohol.     Of note, she had a home sleep test interpreted by Christy [name] on 04/17/2022, which did not show any significant obstructive or central sleep apnea with an AHI of 4.8/h, O2 nadir 88% with mild snoring detected.  She has been in outpatient physical therapy.  She has pain in her right shoulder and has seen orthopedics for this.       05/04/2023 (Christy [name]): <<HISTORY OF PRESENT ILLNESS:  The patient presents for evaluation of memory loss which began when she turned 83 years old. She has noticed worsening word finding difficulty and losing her train of thought. Has trouble remember the names of acquaintances. Misplaces objects around the house. Will occasionally forget to take her medications but remembers later that day. Has a little more trouble managing finances lately but is able to pay bills without missing any payments.   For the past 2 years she has had spells where her chest and neck become warm and flushed. Stops her in her tracks and she will grab something so she doesn't fall. Lasts 5 seconds at a time. Feels shaky afterwards. She is typically standing when this happens. These episodes occur once every 1-2 weeks. She is scheduled for a TTE in October.   TBI:  No past history of TBI Stroke:  no past history of stroke Seizures:  no past history of seizures Sleep: Has trouble sleeping at night and wakes up every 2 hours to use the restroom. Snores at night, did a home sleep study which was negative but has not had an in-lab study. Sleeps a lot during the day. Mood: She has a history of anxiety and depression. Has taken Prozac for this previously but is  not currently on anything.   Functional status: Patient lives with her husband Cooking: Doesn't cook much at baseline, has not left the stove on Bathing: Leaves water running in the bathroom. Has left it on for 30 minute and forgotten about it. No issues with bathing or using the bathroom. Driving: She drives to familiar places that are close by. Has not gotten lost while she's driving Bills: Most of her bills are autopay. She has had more difficulty managing her bills but has not missed a payment Medications: Sometimes forgets to take her medications. Will typically take them but may take them late Getting lost going to familiar places?: no Forgetting loved ones names?: no Word finding difficulty? yes   OTHER MEDICAL CONDITIONS: breast cancer, anxiety, HLD, DM2 >>   Her Past Medical History Is Significant For: Past Medical History:  Diagnosis Date   Agatston coronary  artery calcium  score greater than 400    Ca score 1121 in July 2022   Allergy    Arthritis    Chronic constipation    Depression    Diabetes mellitus without complication (HCC)    History of breast cancer    HLD (hyperlipidemia)    PAT (paroxysmal atrial tachycardia) (HCC)    Ziopatch 02/2021   Pulmonary insufficiency    moderate by echo 11/23   Rheumatoid arthritis (HCC)     Her Past Surgical History Is Significant For: Past Surgical History:  Procedure Laterality Date   ABDOMINAL HYSTERECTOMY     ACHILLES TENDON REPAIR     BREAST REDUCTION SURGERY Left    CATARACT EXTRACTION     masectomy Right    TONSILLECTOMY AND ADENOIDECTOMY      Her Family History Is Significant For: Family History  Problem Relation Age of Onset   Arthritis Mother    Dementia Mother    Stroke Mother    Alcohol abuse Father    Cancer Father        lung   Arthritis Sister    Cancer Sister    Early death Sister    Learning disabilities Son    Depression Son    Diabetes Son    Arthritis Maternal Grandmother    Hearing loss  Maternal Grandmother    Heart attack Maternal Grandfather     Her Social History Is Significant For: Social History   Socioeconomic History   Marital status: Married    Spouse name: Not on file   Number of children: 2   Years of education: Not on file   Highest education level: Master's degree (e.g., MA, MS, MEng, MEd, MSW, MBA)  Occupational History   Not on file  Tobacco Use   Smoking status: Never   Smokeless tobacco: Never  Vaping Use   Vaping status: Never Used  Substance and Sexual Activity   Alcohol use: Not Currently   Drug use: Not Currently   Sexual activity: Not Currently  Other Topics Concern   Not on file  Social History Narrative   Right Handed   No Caffeine Use    Lives with husband   Social Drivers of Health   Financial Resource Strain: Low Risk  (11/12/2022)   Overall Financial Resource Strain (CARDIA)    Difficulty of Paying Living Expenses: Not hard at all  Food Insecurity: No Food Insecurity (11/12/2022)   Hunger Vital Sign    Worried About Running Out of Food in the Last Year: Never true    Ran Out of Food in the Last Year: Never true  Transportation Needs: No Transportation Needs (11/12/2022)   PRAPARE - Administrator, Civil Service (Medical): No    Lack of Transportation (Non-Medical): No  Physical Activity: Insufficiently Active (11/12/2022)   Exercise Vital Sign    Days of Exercise per Week: 3 days    Minutes of Exercise per Session: 20 min  Stress: Stress Concern Present (11/12/2022)   [employer] of Occupational Health - Occupational Stress Questionnaire    Feeling of Stress : To some extent  Social Connections: Unknown (11/12/2022)   Social Connection and Isolation Panel [NHANES]    Frequency of Communication with Friends and Family: More than three times a week    Frequency of Social Gatherings with Friends and Family: More than three times a week    Attends Religious Services: More than 4 times per year    Active Member of  Clubs or Organizations: Not on file    Attends Banker Meetings: Not on file    Marital Status: Married    Her Allergies Are:  Allergies  Allergen Reactions   Atorvastatin      Joint pain on 20mg  daily   Epinephrine    Erythromycin    Gluten Swelling    Feet will swell.  Patient states no longer an issue.   Penicillins    Rosuvastatin      Myalgias and joint pain on 5mg  daily   Tetracyclines & Related   :   Her Current Medications Are:  Outpatient Encounter Medications as of 12/01/2023  Medication Sig   Bempedoic Acid-Ezetimibe (NEXLIZET ) 180-10 MG TABS Take by mouth.   Calcium  Citrate 150 MG CAPS Take 1,000 mg by mouth daily.     Cholecalciferol (VITAMIN D ) 1000 UNITS capsule Take 1,000 Units by mouth daily.     folic acid  (FOLVITE ) 1 MG tablet Take 1 mg by mouth daily.     glucose blood (TRUE METRIX BLOOD GLUCOSE TEST) test strip 1 each by Other route as directed. Use as instructed   MAGNESIUM PO Take 500 mg by mouth at bedtime.   metFORMIN (GLUCOPHAGE) 500 MG tablet Take 500 mg by mouth 2 (two) times daily with a meal.     methotrexate (RHEUMATREX) 2.5 MG tablet Take 2.5 mg by mouth. Caution:Chemotherapy. Protect from light.    MULTIPLE VITAMIN PO Take 1 tablet by mouth daily.     Omega-3 Fatty Acids (FISH OIL PO) Take by mouth.   RYBELSUS  14 MG TABS Take 14 mg by mouth daily.   Bempedoic Acid-Ezetimibe (NEXLIZET ) 180-10 MG TABS Take 1 tablet by mouth daily.   celecoxib  (CELEBREX ) 100 MG capsule Take 1 capsule (100 mg total) by mouth daily as needed. (Patient not taking: Reported on 12/01/2023)   FLUoxetine (PROZAC) 20 MG tablet Take 20 mg by mouth daily.   PSYLLIUM PO Take 8-10 capsules by mouth daily.     No facility-administered encounter medications on file as of 12/01/2023.  :  Review of Systems:  Out of a complete 14 point review of systems, all are reviewed and negative with the exception of these symptoms as listed below:  Review of Systems   Neurological:        Room 9 Pt is here with her Husband and Son. Pt states that everything has been okay since last appointment. Pt states that her symptoms are the same since her last appointment. Pt family states that pt has been stable since her last appointment. Pt what's to know her treatment options and when she will get worse. Pt states she would like to know what natural supplements should she try, as well as diet. Pt wants to know what symptoms should she look out for and why she is so tired all of the time. Pt wants to know what activities should she avoid, and wether or not she can drive. Pt wants to know about physical therapy and support groups. Pt would like to know if brian fog is part of Parkinson's. Pt would like to know why she has ringing in her ear. Pt would like to know why and where she got Parkinson's.     Objective:  Neurological Exam  Physical Exam  Physical Examination:   Vitals:   12/01/23 1433  BP: 117/63  Pulse: 68    General Examination: The patient is a very pleasant 83 y.o. female in no acute distress. She appears well-developed and  well-nourished and well groomed.   HEENT: Normocephalic, atraumatic, pupils are equal, round and reactive to light, extraocular tracking is fairly well-preserved but she does have mild facial masking and mild to moderate nuchal rigidity.  Speech is slightly hypophonic but not dysarthric and no voice tremor noted.  Hearing is grossly intact.  No carotid bruits.  Airway examination reveals mild mouth dryness.  Tongue protrudes centrally and palate elevates symmetrically.  No sialorrhea.  No lip, neck or jaw tremor.     Chest: Clear to auscultation without wheezing, rhonchi or crackles noted.   Heart: S1+S2+0, regular and normal without murmurs, rubs or gallops noted.    Abdomen: Soft, non-tender and non-distended.   Extremities: There is no pitting edema in the distal lower extremities bilaterally.    Skin: Warm and dry  without trophic changes noted.    Musculoskeletal: exam reveals prominent arthritic changes in both hands, right more than left with ulnar deviation noted, appear stable.   Neurologically:  Mental status: The patient is awake, alert and oriented in all 4 spheres. Her immediate and remote memory, attention, language skills and fund of knowledge are fairly appropriate.  Her family supplements her history some.  There is no evidence of aphasia, agnosia, apraxia or anomia. Speech is clear with normal prosody and enunciation. Thought process is linear. Mood is constricted and affect is blunted.      10/31/2023    4:27 PM 05/04/2023    2:00 PM  Montreal Cognitive Assessment   Visuospatial/ Executive (0/5) 2 5  Naming (0/3) 3 3  Attention: Read list of digits (0/2) 1 2  Attention: Read list of letters (0/1) 1 1  Attention: Serial 7 subtraction starting at 100 (0/3) 3 3  Language: Repeat phrase (0/2) 1 1  Language : Fluency (0/1) 0 1  Abstraction (0/2) 2 2  Delayed Recall (0/5) 0 0  Orientation (0/6) 5 6  Total 18 24  Adjusted Score (based on education) 18 24      Cranial nerves II - XII are as described above under HEENT exam.  Motor exam: Thin bulk, global strength of about 4 out of 5.  No obvious resting tremor, slight and intermittent bilateral upper extremity postural tremor noted, right more than left.  Fine motor skills and coordination: Impaired finger taps, hand movements and rapid alternating patting as well as foot taps mildly impaired, all mildly impaired with no significant lateralization noted.  Cerebellar testing: No dysmetria or intention tremor. There is no truncal or gait ataxia.  Sensory exam: intact to light touch in the upper and lower extremities.  Gait, station and balance: She stands with difficulty, posture is age-appropriate, she walks slowly and cautiously, no walking age, mild decrease in arm swing noted and smaller steps noted.  No telltale shuffling.    Assessment  and plan:  In summary, Christy [name] is a very pleasant 83 year old female with an underlying complex medical history of rheumatoid arthritis, allergies, chronic constipation, hyperlipidemia, tachycardia, pulmonary insufficiency, diabetes, history of breast cancer with status post right mastectomy, and memory loss, who presents for follow-up consultation of her parkinsonism and memory loss.  Memory loss.  Her MoCA scores have declined within the past 6 months.  She has had trouble tolerating her antidepressant medication.  She suffers from significant fatigue.  She had a home sleep test through cardiology which was negative for sleep apnea in September 2023.  She has had changes in her balance and mobility and fine motor control and evidence of  parkinsonism on exam.  She had a DaTscan  on 11/11/2023 which showed bilateral decreased activity in the putamen and, as such, supports underlying parkinsonism.  We had a long discussion today about the possibility of starting symptomatic treatment with levodopa , we talked about the benefits, potential common and similar side effects as well as expectations.  She asked about supplements.  We talked about the possibility of her starting high-dose coenzyme Q10, 400 mg 3 times daily.  She is agreeable to starting symptomatic treatment with levodopa .  To that end, I will start her on Sinemet  25-100 mg strength generic half a pill twice daily with gradual titration to 1 pill 3 times daily.  We will plan to follow-up in about 4 months, sooner if needed.  We will continue to monitor her memory.  I answered all the questions today and the patient and her family were in agreement. I spent 40 minutes in total face-to-face time and in reviewing records during pre-charting, more than 50% of which was spent in counseling and coordination of care, reviewing test results, reviewing medications and treatment regimen and/or in discussing or reviewing the diagnosis of parkinsonism, memory loss,  the prognosis and treatment options. Pertinent laboratory and imaging test results that were available during this visit with the patient were reviewed by me and considered in my medical decision making (see chart for details).

## 2023-12-05 MED ORDER — CARBIDOPA-LEVODOPA 25-100 MG PO TABS: 1.0000 | ORAL_TABLET | Freq: Three times a day (TID) | ORAL | 3 refills | Status: AC

## 2023-12-05 NOTE — Patient Instructions (Addendum)
 It was nice to see you today. As discussed, we will start you on low-dose Sinemet (generic name: carbidopa-levodopa) 25/100 mg: Take half a pill twice daily (8 AM and noon) for one week, then half a pill 3 times a day (8 AM, noon, and 4 PM) for one week, then one pill 3 times a day thereafter. Please try to take the medication away from you mealtimes, that is, ideally either one hour before or 2 hours after your meal to ensure optimal absorption. The medication can interfere with the protein content of your meal and trying to the protein in your food and therefore not get fully absorbed.  Common side effects reported are: Nausea, vomiting, sedation, confusion, lightheadedness. Rare side effects include hallucinations, severe nausea or vomiting, diarrhea and significant drop in blood pressure especially when going from lying to standing or from sitting to standing.   We will continue to monitor your exam and your memory function.  Please try to hydrate well with water, 6 to 8 cups/day are recommended, 8 ounce size each.  Please try to get enough rest.  Use caution when standing up or with turns.   Your can consider taking over-the-counter coenzyme Q 10 400 mg 3 times daily.  There was a study in the past that suggested that high-dose coenzyme every day could be favorable in Parkinson's disease patients to potentially slow down the progression.

## 2023-12-12 NOTE — Telephone Encounter (Signed)
 Called pt and LVM with office number for her to call us  back to discuss her medication question(s).

## 2023-12-13 DIAGNOSIS — E785 Hyperlipidemia, unspecified: Secondary | ICD-10-CM | POA: Diagnosis not present

## 2023-12-13 DIAGNOSIS — G20C Parkinsonism, unspecified: Secondary | ICD-10-CM | POA: Diagnosis not present

## 2023-12-13 DIAGNOSIS — I700 Atherosclerosis of aorta: Secondary | ICD-10-CM | POA: Diagnosis not present

## 2023-12-13 DIAGNOSIS — F418 Other specified anxiety disorders: Secondary | ICD-10-CM | POA: Diagnosis not present

## 2023-12-13 DIAGNOSIS — I4719 Other supraventricular tachycardia: Secondary | ICD-10-CM | POA: Diagnosis not present

## 2023-12-13 DIAGNOSIS — Z853 Personal history of malignant neoplasm of breast: Secondary | ICD-10-CM | POA: Diagnosis not present

## 2023-12-13 DIAGNOSIS — E1140 Type 2 diabetes mellitus with diabetic neuropathy, unspecified: Secondary | ICD-10-CM | POA: Diagnosis not present

## 2023-12-13 DIAGNOSIS — R931 Abnormal findings on diagnostic imaging of heart and coronary circulation: Secondary | ICD-10-CM | POA: Diagnosis not present

## 2023-12-13 DIAGNOSIS — R413 Other amnesia: Secondary | ICD-10-CM | POA: Diagnosis not present

## 2023-12-13 DIAGNOSIS — Z0000 Encounter for general adult medical examination without abnormal findings: Secondary | ICD-10-CM | POA: Diagnosis not present

## 2023-12-14 ENCOUNTER — Telehealth: Payer: Self-pay | Admitting: Neurology

## 2023-12-14 NOTE — Telephone Encounter (Signed)
 I was able to speak with the patient. She states she hasn't started the Sinemet  and it is due to her concern that she is on enough medication already and her symptoms are not bothersome enough to her. She said she never really wanted to start medication but she felt pressured by her husband and son. She states its "not for me right now". She said her shakes are "seldom and little". She said she shuffles some and has had two minor falls (first of April and 10 days ago). She said she did not want to take anything unless it was really going to be important. I did discuss with her that these medications are not cures but they do improve symptoms and quality of life. Patient doesn't feel her symptoms are bothering her at this time. She will still f/u in August. I told her I would relay to Dr Omar Bibber. Pt verbalized appreciation for the call.

## 2023-12-14 NOTE — Telephone Encounter (Signed)
 Noted, thank you

## 2023-12-14 NOTE — Telephone Encounter (Signed)
Called pt & LVM with office number for call back.  

## 2023-12-14 NOTE — Telephone Encounter (Addendum)
 Returned her call. Charted in other phone note.

## 2023-12-14 NOTE — Telephone Encounter (Signed)
 May 7,2025 ar 10:25am   PT LVM stating Orion Birks called her was returning call.

## 2024-02-09 ENCOUNTER — Ambulatory Visit: Payer: Self-pay | Admitting: Psychiatry

## 2024-02-09 DIAGNOSIS — F331 Major depressive disorder, recurrent, moderate: Secondary | ICD-10-CM
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
## 2024-02-15 ENCOUNTER — Telehealth: Payer: Self-pay | Admitting: Neurology

## 2024-02-15 NOTE — Telephone Encounter (Signed)
 Pt called to let MD know she would like to do sleep study and wants to know who  is in charge to schedule  appt.SABRA

## 2024-02-15 NOTE — Telephone Encounter (Signed)
 I called pt.  She said that she is worse relating to sleep, fragmented sleep, morning headaches, increased fatigue.  She could not remember that she had HST with Dr. Shlomo cardiology, (but when delved deeper she said that she has not had an in lab sleep study).  I relayed that insurance will authorize HST or inlab (as Dr. Buck places both orders usually).  She appreciated call back.  I will let her know what is recommended.  She was seen for parksinsonism, and sleep was mentioned.  May need sleep consult.

## 2024-02-15 NOTE — Telephone Encounter (Signed)
 I recommend that she make an appointment with Dr. Shlomo in cardiology who interpreted her home sleep test in 2023.

## 2024-02-15 NOTE — Telephone Encounter (Signed)
 I called and LMVM for her at home # that per Dr. Buck recommended to f/u with Dr. Shlomo with cardiology where she had her initial sleep study done back in 2023.

## 2024-02-16 DIAGNOSIS — M0579 Rheumatoid arthritis with rheumatoid factor of multiple sites without organ or systems involvement: Secondary | ICD-10-CM | POA: Diagnosis not present

## 2024-02-20 ENCOUNTER — Ambulatory Visit (INDEPENDENT_AMBULATORY_CARE_PROVIDER_SITE_OTHER): Admitting: Psychiatry

## 2024-02-20 DIAGNOSIS — F331 Major depressive disorder, recurrent, moderate: Secondary | ICD-10-CM | POA: Diagnosis not present
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
## 2024-02-21 ENCOUNTER — Ambulatory Visit: Admitting: Neurology

## 2024-02-21 DIAGNOSIS — H01021 Squamous blepharitis right upper eyelid: Secondary | ICD-10-CM | POA: Diagnosis not present

## 2024-02-21 DIAGNOSIS — H16223 Keratoconjunctivitis sicca, not specified as Sjogren's, bilateral: Secondary | ICD-10-CM | POA: Diagnosis not present

## 2024-02-21 DIAGNOSIS — S0502XA Injury of conjunctiva and corneal abrasion without foreign body, left eye, initial encounter: Secondary | ICD-10-CM | POA: Diagnosis not present

## 2024-02-21 DIAGNOSIS — Z961 Presence of intraocular lens: Secondary | ICD-10-CM | POA: Diagnosis not present

## 2024-03-06 ENCOUNTER — Ambulatory Visit: Admitting: Psychiatry

## 2024-03-20 ENCOUNTER — Ambulatory Visit (INDEPENDENT_AMBULATORY_CARE_PROVIDER_SITE_OTHER): Admitting: Neurology

## 2024-03-20 ENCOUNTER — Encounter: Payer: Self-pay | Admitting: Neurology

## 2024-03-20 VITALS — BP 130/65 | HR 78 | Ht 63.0 in | Wt 148.0 lb

## 2024-03-20 DIAGNOSIS — G479 Sleep disorder, unspecified: Secondary | ICD-10-CM

## 2024-03-20 DIAGNOSIS — R42 Dizziness and giddiness: Secondary | ICD-10-CM

## 2024-03-20 DIAGNOSIS — R413 Other amnesia: Secondary | ICD-10-CM | POA: Diagnosis not present

## 2024-03-20 DIAGNOSIS — G4719 Other hypersomnia: Secondary | ICD-10-CM

## 2024-03-20 DIAGNOSIS — R0683 Snoring: Secondary | ICD-10-CM | POA: Diagnosis not present

## 2024-03-20 DIAGNOSIS — G20C Parkinsonism, unspecified: Secondary | ICD-10-CM | POA: Diagnosis not present

## 2024-03-20 DIAGNOSIS — Z9181 History of falling: Secondary | ICD-10-CM | POA: Diagnosis not present

## 2024-03-20 NOTE — Progress Notes (Signed)
 Subjective:    Patient ID: Christy [name] is a 83 y.o. female.  HPI    True Mar, MD, PhD [facility] 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  The patient is accompanied by her husband and son today.    I last saw her in April 2025, at which time we talked about her DaTscan  results.  She was advised to start levodopa  in low-dose with gradual titration.  Today, 03/20/2024: She reports that she did not start the medication at the time but is willing to start it now.  She felt that she was on too many medications, but has been off of Prozac since our last visit.  She has fallen about 2 months ago and hit the doorknob against her left eyebrow area or eye area, she does have lightheadedness and dizziness from time to time, has 2 types of walkers at the house and also several canes but does not use any walking aids consistently.  She does not sleep well.  She checked in with her cardiologist and was told that she does not need to be treated for sleep apnea.  She would like to get a sleep study done, prior testing was a home sleep test about 2 years ago.  Her Epworth sleepiness score is 11 out of 24, fatigue severity score is 54 out of 63.  She feels that her memory has become worse.  She is taking new eyedrops twice daily for about 4 more weeks per ophthalmology.  The patient's allergies, current medications, family history, past medical history, past social history, past surgical history and problem list were reviewed and updated as appropriate.    Previously:   12/01/2023: She reports feeling about  the same, no recent falls.  She complains of fatigue.  She has lack of energy, she has lost weight over the past few months.  She tries to eat healthy.  She tries to hydrate well with water.  She has had some cognitive complaints.  Her son and her husband supplement her history.  Her son had noticed difficulty with her balance particularly with turns some 6 months ago.   I first met them in March 2025 at which time she transitioned her care from previously seeing Dr. Rush in this office.  Her MoCA was 18 out of 30 at the time.  On examination she had some evidence of parkinsonism.    We proceeded with additional testing including Alzheimer's markers which were not increased.   She had a brain DaTscan  on 11/11/2023 and I reviewed the results:      IMPRESSION:   Bilateral decreased activity in the putamen is a pattern suggestive of Parkinson's syndrome pathology.   Of note, DaTSCAN  is not diagnostic of Parkinsonian syndromes, which remains a clinical diagnosis. DaTscan  is an adjuvant test to aid in the clinical diagnosis of Parkinsonian syndromes.     10/31/2023: She was previously followed in this clinic by Dr. Delon Rush and was evaluated originally in September 2024.  I reviewed the office visit note and copied the note below for reference.  Her MoCA score was 24 at the time.  She was encouraged to talk to PCP about restarting her antidepressant.  Patient's vitamin B12 level was normal in September 2024 at 543, TSH normal at 1.8.     She had a brain MRI with and without contrast on 06/12/2023 and I reviewed the results:     IMPRESSION: This MRI of the brain with and without contrast shows the following: 1. There are no acute findings.  Normal enhancement pattern. 2. Mild to moderate generalized cortical atrophy, mildly more pronounced in the medial temporal lobes.  Mild corpus callosal and cerebellar atrophy is also noted. 3. Small remote left frontal lobe infarction.   4. scattered  T2/FLAIR hyperintense foci in the cerebral hemispheres most consistent with mild chronic microvascular ischemic change, typical for age.   In addition, I personally and independently reviewed images through the PACS system.  They were notified of the test results by phone call.   She reports that her forgetfulness has become worse.  She is primarily bothered by her lack of energy and fatigue.  Her husband endorses that she has a tendency to sleep during the day for extended naps.  She has a good support system.  She has 2 biological children from her first marriage and he has biological children from his prior marriages.  He reports that she does not feel comfortable being by herself for too long and would like for him to come back home sooner than he can sometimes.    She had a fall recently.  She fell in the kitchen, they were washing her hair in the kitchen sink as it is easier for him to help her that way.  She fell backwards and landed on her behind and braced her fall with her hands, did not hit her head.  She sees rheumatology on a regular basis.  She reports that her mom had dementia, she lived to be 38, her dad passed at 6 with lung cancer, she lost her only sister at the age of 90 with breast cancer.  Her maternal grandmother lived to be 47.  She is a retired Clinical biochemist.  She worked at [employer] and also as a Management consultant in the past.  She is currently weaning off of Prozac as she had side effects.  She is a non-smoker and does not drink alcohol.     Of note, she had a home sleep test interpreted by Dr. Wilbert Bihari on 04/17/2022, which did not show any significant obstructive or central sleep apnea with an AHI of 4.8/h, O2 nadir 88% with mild snoring detected.  She has been in outpatient physical therapy.  She has pain in her right shoulder and has seen orthopedics for this.       05/04/2023 (Dr. Delon Nurse): <<HISTORY OF PRESENT ILLNESS:  The  patient presents for evaluation of memory loss which began when she turned 83 years old. She has noticed worsening word finding difficulty and losing her train of thought. Has trouble remember the names of acquaintances. Misplaces objects around the house. Will occasionally forget to take her medications but remembers later that day. Has a little more trouble managing finances lately but is able to pay bills without missing any payments.   For the past 2 years she has had spells where her chest and neck become warm and flushed. Stops her in her tracks and she will grab something so she doesn't fall. Lasts 5 seconds at a time. Feels shaky afterwards. She is typically standing when this happens. These episodes occur once every 1-2 weeks. She  is scheduled for a TTE in October.   TBI:  No past history of TBI Stroke:  no past history of stroke Seizures:  no past history of seizures Sleep: Has trouble sleeping at night and wakes up every 2 hours to use the restroom. Snores at night, did a home sleep study which was negative but has not had an in-lab study. Sleeps a lot during the day. Mood: She has a history of anxiety and depression. Has taken Prozac for this previously but is not currently on anything.   Functional status: Patient lives with her husband Cooking: Doesn't cook much at baseline, has not left the stove on Bathing: Leaves water running in the bathroom. Has left it on for 30 minute and forgotten about it. No issues with bathing or using the bathroom. Driving: She drives to familiar places that are close by. Has not gotten lost while she's driving Bills: Most of her bills are autopay. She has had more difficulty managing her bills but has not missed a payment Medications: Sometimes forgets to take her medications. Will typically take them but may take them late Getting lost going to familiar places?: no Forgetting loved ones names?: no Word finding difficulty? yes   OTHER MEDICAL  CONDITIONS: breast cancer, anxiety, HLD, DM2 >>  Her Past Medical History Is Significant For: Past Medical History:  Diagnosis Date   Agatston coronary artery calcium  score greater than 400    Ca score 1121 in July 2022   Allergy    Arthritis    Chronic constipation    Depression    Diabetes mellitus without complication (HCC)    History of breast cancer    HLD (hyperlipidemia)    PAT (paroxysmal atrial tachycardia) (HCC)    Ziopatch 02/2021   Pulmonary insufficiency    moderate by echo 11/23   Rheumatoid arthritis (HCC)     Her Past Surgical History Is Significant For: Past Surgical History:  Procedure Laterality Date   ABDOMINAL HYSTERECTOMY     ACHILLES TENDON REPAIR     BREAST REDUCTION SURGERY Left    CATARACT EXTRACTION     masectomy Right    TONSILLECTOMY AND ADENOIDECTOMY      Her Family History Is Significant For: Family History  Problem Relation Age of Onset   Arthritis Mother    Dementia Mother    Stroke Mother    Alcohol abuse Father    Cancer Father        lung   Arthritis Sister    Cancer Sister    Early death Sister    Arthritis Maternal Grandmother    Hearing loss Maternal Grandmother    Heart attack Maternal Grandfather    Learning disabilities Son    Depression Son    Diabetes Son    Parkinson's disease Neg Hx     Her Social History Is Significant For: Social History   Socioeconomic History   Marital status: Married    Spouse name: Not on file   Number of children: 2   Years of education: Not on file   Highest education level: Master's degree (e.g., MA, MS, MEng, MEd, MSW, MBA)  Occupational History   Not on file  Tobacco Use   Smoking status: Never   Smokeless tobacco: Never  Vaping Use   Vaping status: Never Used  Substance and Sexual Activity   Alcohol use: Not Currently   Drug use: Not Currently   Sexual activity: Not Currently  Other Topics Concern   Not on  file  Social History Narrative   Right Handed   No Caffeine  Use    Lives with husband   Retired    Teacher, 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 Risk  (11/12/2022)   Overall Financial Resource Strain (CARDIA)    Difficulty of Paying Living Expenses: Not hard at all  Food Insecurity: No Food Insecurity (11/12/2022)   Hunger Vital Sign    Worried About Running Out of Food in the Last Year: Never true    Ran Out of Food in the Last Year: Never true  Transportation Needs: No Transportation Needs (11/12/2022)   PRAPARE - Administrator, Civil Service (Medical): No    Lack of Transportation (Non-Medical): No  Physical Activity: Insufficiently Active (11/12/2022)   Exercise Vital Sign    Days of Exercise per Week: 3 days    Minutes of Exercise per Session: 20 min  Stress: Stress Concern Present (11/12/2022)   [employer] of Occupational Health - Occupational Stress Questionnaire    Feeling of Stress : To some extent  Social Connections: Unknown (11/12/2022)   Social Connection and Isolation Panel    Frequency of Communication with Friends and Family: More than three times a week    Frequency of Social Gatherings with Friends and Family: More than three times a week    Attends Religious Services: More than 4 times per year    Active Member of [employer] or Organizations: Not on file    Attends Banker Meetings: Not on file    Marital Status: Married    Her Allergies Are:  Allergies  Allergen Reactions   Atorvastatin      Joint pain on 20mg  daily   Epinephrine    Erythromycin    Gluten Swelling    Feet will swell.  Patient states no longer an issue.   Penicillins    Rosuvastatin      Myalgias and joint pain on 5mg  daily   Tetracyclines & Related   :   Her Current Medications Are:  Outpatient Encounter Medications as of 03/20/2024  Medication Sig   Bempedoic Acid-Ezetimibe (NEXLIZET ) 180-10 MG TABS Take by mouth.   Calcium  Citrate 150 MG CAPS Take 1,000 mg by mouth daily.     carbidopa -levodopa  (SINEMET  IR)  25-100 MG tablet Take 1 tablet by mouth 3 (three) times daily.   celecoxib  (CELEBREX ) 100 MG capsule Take 1 capsule (100 mg total) by mouth daily as needed.   Cholecalciferol (VITAMIN D ) 1000 UNITS capsule Take 1,000 Units by mouth daily.     folic acid  (FOLVITE ) 1 MG tablet Take 1 mg by mouth daily.     glucose blood (TRUE METRIX BLOOD GLUCOSE TEST) test strip 1 each by Other route as directed. Use as instructed   MAGNESIUM PO Take 500 mg by mouth at bedtime.   metFORMIN (GLUCOPHAGE) 500 MG tablet Take 500 mg by mouth 2 (two) times daily with a meal.     methotrexate (RHEUMATREX) 2.5 MG tablet Take 2.5 mg by mouth. Caution:Chemotherapy. Protect from light.    MULTIPLE VITAMIN PO Take 1 tablet by mouth daily.     Omega-3 Fatty Acids (FISH OIL PO) Take by mouth.   RYBELSUS  14 MG TABS Take 14 mg by mouth daily.   Bempedoic Acid-Ezetimibe (NEXLIZET ) 180-10 MG TABS Take 1 tablet by mouth daily.   FLUoxetine (PROZAC) 20 MG tablet Take 20 mg by mouth daily.   PSYLLIUM PO Take 8-10 capsules by mouth daily.  No facility-administered encounter medications on file as of 03/20/2024.  :   Review of Systems:  Out of a complete 14 point review of systems, all are reviewed and negative with the exception of these symptoms as listed below:   Review of Systems  Neurological:        Pt here fir Parkinson f/u Pt states not taking SINEMET  25-100mg  Want to discuss starting medication    Objective:  Neurological Exam  Physical Exam Physical Examination:   Vitals:   03/20/24 1326  BP: 130/65  Pulse: 78    General Examination: The patient is a very pleasant 83 y.o. female in no acute distress. She appears well-developed and well-nourished and well groomed.   HEENT: Normocephalic, atraumatic, pupils are equal, round and reactive to light, she has mild puffiness of the left eyelid.  Extraocular tracking is fairly well-preserved but she does have mild to moderate facial masking and mild to moderate  nuchal rigidity.  Speech is slightly hypophonic but not dysarthric and no voice tremor noted.  Hearing is grossly intact.  No carotid bruits. No sialorrhea.  No lip, neck or jaw tremor.     Chest: Clear to auscultation without wheezing, rhonchi or crackles noted.   Heart: S1+S2+0, regular and normal without murmurs, rubs or gallops noted.    Abdomen: Soft, non-tender and non-distended.   Extremities: There is no obvious swelling.    Skin: Warm and dry without trophic changes noted.    Musculoskeletal: exam reveals prominent arthritic changes in both hands, right more than left with ulnar deviation noted, appear stable.   Neurologically:  Mental status: The patient is awake, alert and oriented in all 4 spheres. Her immediate and remote memory, attention, language skills and fund of knowledge are fair.  Her family supplements her history.  There is no evidence of aphasia, agnosia, apraxia or anomia. Speech is clear with normal prosody and enunciation. Thought process is linear. Mood is constricted and affect is blunted.       10/31/2023    4:27 PM 05/04/2023    2:00 PM  Montreal Cognitive Assessment   Visuospatial/ Executive (0/5) 2 5  Naming (0/3) 3 3  Attention: Read list of digits (0/2) 1 2  Attention: Read list of letters (0/1) 1 1  Attention: Serial 7 subtraction starting at 100 (0/3) 3 3  Language: Repeat phrase (0/2) 1 1  Language : Fluency (0/1) 0 1  Abstraction (0/2) 2 2  Delayed Recall (0/5) 0 0  Orientation (0/6) 5 6  Total 18 24  Adjusted Score (based on education) 18 24      Cranial nerves II - XII are as described above under HEENT exam.  Motor exam: Thin bulk, global strength of about 4 out of 5.  No obvious resting tremor today. Fine motor skills and coordination: Globally impaired fine motor skills.  Cerebellar testing: No dysmetria or intention tremor. There is no truncal or gait ataxia.  Sensory exam: intact to light touch in the upper and lower extremities.    Assessment and plan:  In summary, Christy [name] is a very pleasant 83 year old female with an underlying complex medical history of rheumatoid arthritis, allergies, chronic constipation, hyperlipidemia, tachycardia, pulmonary insufficiency, diabetes, history of breast cancer with status post right mastectomy, and memory loss, who presents for follow-up consultation of her parkinsonism an memory loss.  Her MoCA scores have declined over time.  She is no longer on Prozac, she had trouble tolerating her antidepressant medication.  She does not  sleep well.  She had a home sleep test through cardiology in September 2023, which was negative for sleep apnea in September 2023.  She has had changes in her balance and mobility and fine motor control and evidence of parkinsonism on exam.  She had a DaTscan  on 11/11/2023 which showed bilateral decreased activity in the putamen and, as such, supports underlying parkinsonism.  We had agreed to start her on levodopa  therapy back in April but she did not end up starting it then.  She would be willing to start levodopa  therapy at this time, she is advised to use her walker at all times for gait safety as she has fallen.  We talked about the importance of maintaining a healthy lifestyle.  She would like to get reevaluated for sleep apnea or sleep concern in general.  We will proceed with a laboratory attended sleep study at this time.  We will plan follow-up after testing.  We may consider medication for memory loss down the road but for now we will focus on starting her on levodopa  therapy. I answered all their questions today and the patient and her family were in agreement. I spent 30 minutes in total face-to-face time and in reviewing records during pre-charting, more than 50% of which was spent in counseling and coordination of care, reviewing test results, reviewing medications and treatment regimen and/or in discussing or reviewing the diagnosis of parkinsonism, memory  loss, sleep disturbance, the prognosis and treatment options. Pertinent laboratory and imaging test results that were available during this visit with the patient were reviewed by me and considered in my medical decision making (see chart for details).

## 2024-03-20 NOTE — Patient Instructions (Addendum)
 You can start the levodopa  as instructed at the last visit.  I would be happy to order a sleep study to evaluate you for sleep apnea.  If you have obstructive sleep apnea, I would likely offer you treatment with an AutoPap machine, similar to what your son uses. We will plan to follow-up after sleep testing.  Please use your walker at all times for gait safety.

## 2024-03-21 ENCOUNTER — Telehealth: Payer: Self-pay | Admitting: Neurology

## 2024-03-21 NOTE — Telephone Encounter (Signed)
 LVM for patient to call back and discuss dosage of sinemet 

## 2024-03-21 NOTE — Telephone Encounter (Signed)
 Patient asking if can send instruction on new parkinson's disease medication. Can not get on my computer. Would like a call back.

## 2024-03-22 NOTE — Telephone Encounter (Signed)
 Spoke to pt and gave her the instuctions.  I will email to her [email or web address]  sent and also mailed copy.  Pt verbalized understanding of plan.

## 2024-03-26 ENCOUNTER — Telehealth: Payer: Self-pay | Admitting: Neurology

## 2024-03-26 NOTE — Telephone Encounter (Signed)
 I spoke with the patient.  [employer] shara: [id number] (exp. 03/23/24 to 07/08/24) & [employer] life no auth req   She is scheduled at [facility] for 04/22/2024 at 8 pm.  Mailed packet and sent mychart.

## 2024-04-01 ENCOUNTER — Other Ambulatory Visit: Payer: Self-pay | Admitting: Physician Assistant

## 2024-04-04 ENCOUNTER — Ambulatory Visit (INDEPENDENT_AMBULATORY_CARE_PROVIDER_SITE_OTHER): Admitting: Psychiatry

## 2024-04-04 DIAGNOSIS — F331 Major depressive disorder, recurrent, moderate: Secondary | ICD-10-CM

## 2024-04-04 NOTE — Progress Notes (Signed)
 [facility] Counselor/Therapist Progress Note  Patient ID: Christy [name], MRN: [id number],    Date: 04/04/2024  Time Spent: 53 minutes   Treatment Type: Individual Therapy  Reported Symptoms: depression, anxiety, recently diagnosed with Parkinson's by Dr. Buck, difficulty sleeping     Mental Status Exam:  Appearance:   Casual and Neat     Behavior:  Appropriate, Sharing, and Motivated  Motor:  Impacted by her Parkinson's  Speech/Language:   Some slowness at times  Affect:  Anxious, some depression  Mood:  anxious and depressed  Thought process:  Some times I lose track of thoughts in conversations  Thought content:    Some ruminating at night when I try to go to sleep  Sensory/Perceptual disturbances:    N/A  Orientation:  oriented to person, place, time/date, situation, day of week, month of year, year, and stated date of Aug. 27, 2025  Attention:  Fair  Concentration:  Fair  Memory:  Some memory concerns and recently diagnosed with Parkinson's, but also doing well at times in answering some specific questions including week, month, year  Fund of knowledge:   Fair  Insight:    Fair  Judgment:   Good  Impulse Control:  Good   Risk Assessment: Danger to Self:  No Self-injurious Behavior: No Danger to Others: No Duty to Warn:no Physical Aggression / Violence:No  Access to Firearms a concern: No  Gang Involvement:No   Subjective:   Patient reporting problems with bad thoughts in my head, like I fear not being around much longer, as they want me to take meds for my Parkinson's , am taking my cholesterol meds. Today talked through some anxiety, stress about her diagnosis, and especially some fearful and negative thoughts which seemed especially helpful to her. Expressing some of her tearfulness and fears of what could lie ahead for her as Parkinson's increases over time.  Encouraged her venting about her fears as she states she keeps most of that to herself.   Family finds it hard to hear her talk about it and avoids talking about it in some cases.  Patient did talk much more openly as the session went on and seem to feel supported and a little more steady as she left session today.  Is noticing that she cannot walk quite as well, but can use her stationary bike some at home.  Enjoys reading and watching TV and being with family.  Husband especially seems supportive.  Patient is very pleasant and does admit today that she is worrying more and particularly about how long will I be on this earth.  She denies any thoughts to harm herself but is more worried about what the Parkinson's might do to me.  Worked with this in session and patient did really well, seemed to appreciate the fact that we were talking very openly, as her family has a difficult time doing this or they say things that are not helpful.  She does not want to comment to them on this because she does not want to be hurtful.  We did discuss some ways how she could communicate with family and ways that would help inform them of what is helpful to her and that way she could share that information with her family which she feels might help.  Admits that she is worrying a lot but did feel it was very helpful today to be able to talk more openly and directly with someone who is not going to avoid  the issue or talk around it.  I felt patient did very well herself and talking and helping me understand better where she is at with all of the stress going on for her right now, and also help to be to realize what might help patient the most right now.  Does have some strengths and support in other relationships particularly within her church and a few friends.  Good session and she is scheduled to return again within approximately 3 weeks.   Interventions: Solution-Oriented/Positive Psychology and Ego-Supportive 1.  Identify life conflicts and/or situations including from the past and in the present, that  support current symptomology of anxiety and/or depression. 2.  Develop behavioral and cognitive strategies to reduce or eliminate excessive anxiety or depression.  Identify, challenge, and replace negative self talk with more positive, realistic, and empowering self talk. 3.  Patient will work to develop reality based, positive cognitive messages that can help improve her mood and outlook while also working on building self-confidence and having more hope for the future.    Diagnosis:   ICD-10-CM   1. Major depressive disorder, recurrent episode, moderate (HCC)  F33.1      Plan:   Patient connecting and working further in session today on her anxiety, her self-talk, depression, life challenges, and trying to be more positive and less negative going forward.  Definitely feels that she has progressed some in therapy and is motivated to continue working with her treatment goals.  Goal review and progress/challenges noted with patient.  Next appointment within 2 weeks.   Barnie Bunde, LCSW

## 2024-04-10 DIAGNOSIS — H01021 Squamous blepharitis right upper eyelid: Secondary | ICD-10-CM | POA: Diagnosis not present

## 2024-04-10 DIAGNOSIS — E119 Type 2 diabetes mellitus without complications: Secondary | ICD-10-CM | POA: Diagnosis not present

## 2024-04-10 DIAGNOSIS — H35363 Drusen (degenerative) of macula, bilateral: Secondary | ICD-10-CM | POA: Diagnosis not present

## 2024-04-10 DIAGNOSIS — H01024 Squamous blepharitis left upper eyelid: Secondary | ICD-10-CM | POA: Diagnosis not present

## 2024-04-22 ENCOUNTER — Emergency Department (HOSPITAL_BASED_OUTPATIENT_CLINIC_OR_DEPARTMENT_OTHER)

## 2024-04-22 ENCOUNTER — Encounter

## 2024-04-22 ENCOUNTER — Other Ambulatory Visit: Payer: Self-pay

## 2024-04-22 ENCOUNTER — Ambulatory Visit: Admission: EM | Admit: 2024-04-22 | Discharge: 2024-04-22 | Disposition: A | Discharge status: Home / Self Care

## 2024-04-22 ENCOUNTER — Encounter: Payer: Self-pay | Admitting: Emergency Medicine

## 2024-04-22 ENCOUNTER — Encounter (HOSPITAL_BASED_OUTPATIENT_CLINIC_OR_DEPARTMENT_OTHER): Payer: Self-pay

## 2024-04-22 ENCOUNTER — Inpatient Hospital Stay (HOSPITAL_BASED_OUTPATIENT_CLINIC_OR_DEPARTMENT_OTHER)
Admission: EM | Admit: 2024-04-22 | Discharge: 2024-04-24 | DRG: 603 | Disposition: A | Discharge status: Home / Self Care | Source: Ambulatory Visit | Attending: Family Medicine | Admitting: Family Medicine

## 2024-04-22 DIAGNOSIS — Z79631 Long term (current) use of antimetabolite agent: Secondary | ICD-10-CM | POA: Diagnosis not present

## 2024-04-22 DIAGNOSIS — Z823 Family history of stroke: Secondary | ICD-10-CM | POA: Diagnosis not present

## 2024-04-22 DIAGNOSIS — E1140 Type 2 diabetes mellitus with diabetic neuropathy, unspecified: Secondary | ICD-10-CM | POA: Diagnosis present

## 2024-04-22 DIAGNOSIS — G20A1 Parkinson's disease without dyskinesia, without mention of fluctuations: Secondary | ICD-10-CM | POA: Diagnosis present

## 2024-04-22 DIAGNOSIS — M069 Rheumatoid arthritis, unspecified: Secondary | ICD-10-CM | POA: Diagnosis present

## 2024-04-22 DIAGNOSIS — Z9011 Acquired absence of right breast and nipple: Secondary | ICD-10-CM

## 2024-04-22 DIAGNOSIS — D84821 Immunodeficiency due to drugs: Secondary | ICD-10-CM | POA: Diagnosis not present

## 2024-04-22 DIAGNOSIS — A419 Sepsis, unspecified organism: Secondary | ICD-10-CM | POA: Diagnosis not present

## 2024-04-22 DIAGNOSIS — Z853 Personal history of malignant neoplasm of breast: Secondary | ICD-10-CM

## 2024-04-22 DIAGNOSIS — E785 Hyperlipidemia, unspecified: Secondary | ICD-10-CM | POA: Diagnosis not present

## 2024-04-22 DIAGNOSIS — Z79899 Other long term (current) drug therapy: Secondary | ICD-10-CM | POA: Diagnosis not present

## 2024-04-22 DIAGNOSIS — L03211 Cellulitis of face: Principal | ICD-10-CM | POA: Diagnosis present

## 2024-04-22 DIAGNOSIS — Z8261 Family history of arthritis: Secondary | ICD-10-CM | POA: Diagnosis not present

## 2024-04-22 DIAGNOSIS — Z888 Allergy status to other drugs, medicaments and biological substances status: Secondary | ICD-10-CM | POA: Diagnosis not present

## 2024-04-22 DIAGNOSIS — Z9849 Cataract extraction status, unspecified eye: Secondary | ICD-10-CM | POA: Diagnosis not present

## 2024-04-22 DIAGNOSIS — R918 Other nonspecific abnormal finding of lung field: Secondary | ICD-10-CM | POA: Diagnosis not present

## 2024-04-22 DIAGNOSIS — Z880 Allergy status to penicillin: Secondary | ICD-10-CM | POA: Diagnosis not present

## 2024-04-22 DIAGNOSIS — Z8249 Family history of ischemic heart disease and other diseases of the circulatory system: Secondary | ICD-10-CM

## 2024-04-22 DIAGNOSIS — Z833 Family history of diabetes mellitus: Secondary | ICD-10-CM | POA: Diagnosis not present

## 2024-04-22 DIAGNOSIS — I700 Atherosclerosis of aorta: Secondary | ICD-10-CM | POA: Diagnosis not present

## 2024-04-22 DIAGNOSIS — J340 Abscess, furuncle and carbuncle of nose: Secondary | ICD-10-CM

## 2024-04-22 DIAGNOSIS — Z7984 Long term (current) use of oral hypoglycemic drugs: Secondary | ICD-10-CM | POA: Diagnosis not present

## 2024-04-22 DIAGNOSIS — R220 Localized swelling, mass and lump, head: Secondary | ICD-10-CM | POA: Diagnosis not present

## 2024-04-22 DIAGNOSIS — R9431 Abnormal electrocardiogram [ECG] [EKG]: Secondary | ICD-10-CM | POA: Diagnosis not present

## 2024-04-22 DIAGNOSIS — I6523 Occlusion and stenosis of bilateral carotid arteries: Secondary | ICD-10-CM | POA: Diagnosis not present

## 2024-04-22 HISTORY — DX: Parkinson's disease without dyskinesia, without mention of fluctuations: G20.A1

## 2024-04-22 LAB — CBC WITH DIFFERENTIAL/PLATELET
Abs Immature Granulocytes: 0.03 K/uL (ref 0.00–0.07)
Basophils Absolute: 0 K/uL (ref 0.0–0.1)
Basophils Relative: 1 %
Eosinophils Absolute: 0.1 K/uL (ref 0.0–0.5)
Eosinophils Relative: 1 %
HCT: 30.1 % — ABNORMAL LOW (ref 36.0–46.0)
Hemoglobin: 10.1 g/dL — ABNORMAL LOW (ref 12.0–15.0)
Immature Granulocytes: 1 %
Lymphocytes Relative: 14 %
Lymphs Abs: 0.7 K/uL (ref 0.7–4.0)
MCH: 31.5 pg (ref 26.0–34.0)
MCHC: 33.6 g/dL (ref 30.0–36.0)
MCV: 93.8 fL (ref 80.0–100.0)
Monocytes Absolute: 0.4 K/uL (ref 0.1–1.0)
Monocytes Relative: 8 %
Neutro Abs: 3.8 K/uL (ref 1.7–7.7)
Neutrophils Relative %: 75 %
Platelets: 219 K/uL (ref 150–400)
RBC: 3.21 MIL/uL — ABNORMAL LOW (ref 3.87–5.11)
RDW: 14.9 % (ref 11.5–15.5)
WBC: 5 K/uL (ref 4.0–10.5)
nRBC: 0 % (ref 0.0–0.2)

## 2024-04-22 LAB — URINALYSIS, W/ REFLEX TO CULTURE (INFECTION SUSPECTED)
Bilirubin Urine: NEGATIVE
Glucose, UA: NEGATIVE mg/dL
Ketones, ur: NEGATIVE mg/dL
Leukocytes,Ua: NEGATIVE
Nitrite: NEGATIVE
Specific Gravity, Urine: 1.006 (ref 1.005–1.030)
pH: 6 (ref 5.0–8.0)

## 2024-04-22 LAB — COMPREHENSIVE METABOLIC PANEL WITH GFR
ALT: 9 U/L (ref 0–44)
AST: 17 U/L (ref 15–41)
Albumin: 4 g/dL (ref 3.5–5.0)
Alkaline Phosphatase: 52 U/L (ref 38–126)
Anion gap: 14 (ref 5–15)
BUN: 17 mg/dL (ref 8–23)
CO2: 19 mmol/L — ABNORMAL LOW (ref 22–32)
Calcium: 9.8 mg/dL (ref 8.9–10.3)
Chloride: 101 mmol/L (ref 98–111)
Creatinine, Ser: 1.09 mg/dL — ABNORMAL HIGH (ref 0.44–1.00)
GFR, Estimated: 50 mL/min — ABNORMAL LOW (ref >60)
Glucose, Bld: 198 mg/dL — ABNORMAL HIGH (ref 70–99)
Potassium: 3.6 mmol/L (ref 3.5–5.1)
Sodium: 134 mmol/L — ABNORMAL LOW (ref 135–145)
Total Bilirubin: 0.7 mg/dL (ref 0.0–1.2)
Total Protein: 7.1 g/dL (ref 6.5–8.1)

## 2024-04-22 LAB — LACTIC ACID, PLASMA: Lactic Acid, Venous: 0.9 mmol/L (ref 0.5–1.9)

## 2024-04-22 LAB — PROTIME-INR
INR: 1 (ref 0.8–1.2)
Prothrombin Time: 14.2 s (ref 11.4–15.2)

## 2024-04-22 LAB — CBG MONITORING, ED: Glucose-Capillary: 176 mg/dL — ABNORMAL HIGH (ref 70–99)

## 2024-04-22 LAB — RESP PANEL BY RT-PCR (RSV, FLU A&B, COVID)  RVPGX2
Influenza A by PCR: NEGATIVE
Influenza B by PCR: NEGATIVE
Resp Syncytial Virus by PCR: NEGATIVE
SARS Coronavirus 2 by RT PCR: NEGATIVE

## 2024-04-22 LAB — GLUCOSE, CAPILLARY: Glucose-Capillary: 167 mg/dL — ABNORMAL HIGH (ref 70–99)

## 2024-04-22 MED ORDER — SODIUM CHLORIDE 0.9 % IV BOLUS
1000.0000 mL | Freq: Once | INTRAVENOUS | Status: AC
  Administered 2024-04-22: 1000 mL via INTRAVENOUS

## 2024-04-22 MED ORDER — ACETAMINOPHEN 500 MG PO TABS
1000.0000 mg | ORAL_TABLET | Freq: Once | ORAL | Status: AC
  Administered 2024-04-22: 1000 mg via ORAL
  Filled 2024-04-22: qty 2

## 2024-04-22 MED ORDER — ENOXAPARIN SODIUM 40 MG/0.4ML IJ SOSY
40.0000 mg | PREFILLED_SYRINGE | INTRAMUSCULAR | Status: DC
  Administered 2024-04-24: 40 mg via SUBCUTANEOUS
  Filled 2024-04-22 (×2): qty 0.4

## 2024-04-22 MED ORDER — VANCOMYCIN HCL 750 MG/150ML IV SOLN
750.0000 mg | INTRAVENOUS | Status: DC
  Administered 2024-04-23 – 2024-04-24 (×2): 750 mg via INTRAVENOUS
  Filled 2024-04-22 (×2): qty 150

## 2024-04-22 MED ORDER — ACETAMINOPHEN 325 MG PO TABS: 650.0000 mg | ORAL_TABLET | Freq: Four times a day (QID) | ORAL | Status: DC | PRN

## 2024-04-22 MED ORDER — ACETAMINOPHEN 650 MG RE SUPP: 650.0000 mg | Freq: Four times a day (QID) | RECTAL | Status: DC | PRN

## 2024-04-22 MED ORDER — LACTATED RINGERS IV BOLUS (SEPSIS)
1000.0000 mL | Freq: Once | INTRAVENOUS | Status: AC
  Administered 2024-04-22: 1000 mL via INTRAVENOUS

## 2024-04-22 MED ORDER — ONDANSETRON HCL 4 MG/2ML IJ SOLN: 4.0000 mg | Freq: Four times a day (QID) | INTRAMUSCULAR | Status: DC | PRN

## 2024-04-22 MED ORDER — LACTATED RINGERS IV SOLN: INTRAVENOUS | Status: DC

## 2024-04-22 MED ORDER — ONDANSETRON HCL 4 MG PO TABS: 4.0000 mg | ORAL_TABLET | Freq: Four times a day (QID) | ORAL | Status: DC | PRN

## 2024-04-22 MED ORDER — INSULIN ASPART 100 UNIT/ML IJ SOLN: 0.0000 [IU] | Freq: Every day | INTRAMUSCULAR | Status: DC

## 2024-04-22 MED ORDER — SODIUM CHLORIDE 0.9% FLUSH
3.0000 mL | Freq: Two times a day (BID) | INTRAVENOUS | Status: DC
  Administered 2024-04-23 – 2024-04-24 (×2): 3 mL via INTRAVENOUS

## 2024-04-22 MED ORDER — SODIUM CHLORIDE 0.9 % IV SOLN
2.0000 g | Freq: Once | INTRAVENOUS | Status: AC
  Administered 2024-04-22: 2 g via INTRAVENOUS
  Filled 2024-04-22: qty 20

## 2024-04-22 MED ORDER — CARBIDOPA-LEVODOPA 25-100 MG PO TABS: 1.0000 | ORAL_TABLET | Freq: Three times a day (TID) | ORAL | Status: DC

## 2024-04-22 MED ORDER — INSULIN ASPART 100 UNIT/ML IJ SOLN
0.0000 [IU] | Freq: Three times a day (TID) | INTRAMUSCULAR | Status: DC
  Administered 2024-04-23: 2 [IU] via SUBCUTANEOUS

## 2024-04-22 MED ORDER — IOHEXOL 300 MG/ML  SOLN
100.0000 mL | Freq: Once | INTRAMUSCULAR | Status: AC | PRN
  Administered 2024-04-22: 100 mL via INTRAVENOUS

## 2024-04-22 MED ORDER — FOLIC ACID 1 MG PO TABS
1.0000 mg | ORAL_TABLET | Freq: Every day | ORAL | Status: DC
  Administered 2024-04-23 – 2024-04-24 (×2): 1 mg via ORAL
  Filled 2024-04-22 (×2): qty 1

## 2024-04-22 MED ORDER — SENNOSIDES-DOCUSATE SODIUM 8.6-50 MG PO TABS: 1.0000 | ORAL_TABLET | Freq: Every evening | ORAL | Status: DC | PRN

## 2024-04-22 MED ORDER — SODIUM CHLORIDE 0.9 % IV SOLN
2.0000 g | INTRAVENOUS | Status: DC
  Administered 2024-04-23 – 2024-04-24 (×2): 2 g via INTRAVENOUS
  Filled 2024-04-22 (×3): qty 20

## 2024-04-22 MED ORDER — VANCOMYCIN HCL IN DEXTROSE 1-5 GM/200ML-% IV SOLN
1000.0000 mg | Freq: Once | INTRAVENOUS | Status: AC
  Administered 2024-04-22: 1000 mg via INTRAVENOUS
  Filled 2024-04-22: qty 200

## 2024-04-22 NOTE — ED Notes (Signed)
 Patient transported to CT

## 2024-04-22 NOTE — Progress Notes (Signed)
 Pharmacy Antibiotic Note  Christy [name] is a 83 y.o. female admitted on 04/22/2024 with right-sided facial cellulitis.  Pharmacy has been consulted for Vancomycin  dosing.  Plan: Rocephin  per MD Vancomycin  750mg  IV q24h to target AUC 415 (goal 400-550) Monitor renal function and cx data   Height: 5' 3 (160 cm) Weight: 65.3 kg (144 lb) IBW/kg (Calculated) : 52.4  Temp (24hrs), Avg:99.4 F (37.4 C), Min:98.5 F (36.9 C), Max:99.8 F (37.7 C)  Recent Labs  Lab 04/22/24 1441  WBC 5.0  CREATININE 1.09*  LATICACIDVEN 0.9    Estimated Creatinine Clearance: 36.2 mL/min (A) (by C-G formula based on SCr of 1.09 mg/dL (H)).    Allergies  Allergen Reactions   Atorvastatin      Joint pain on 20mg  daily   Epinephrine    Erythromycin    Gluten Swelling    Feet will swell.  Patient states no longer an issue.   Penicillins    Rosuvastatin      Myalgias and joint pain on 5mg  daily   Tetracyclines & Related     Antimicrobials this admission: 9/14 Rocephin  >>  9/14 Vancomycin  >>   Dose adjustments this admission:  Microbiology results: 9/14 BCx: 9/14 Resp PCR: negative  Thank you for allowing pharmacy to be a part of this patient's care.  Rosaline Millet PharmD 04/22/2024 10:55 PM

## 2024-04-22 NOTE — ED Notes (Signed)
 Patient is being discharged from the Urgent Care and sent to the Emergency Department via POV . Per Dorna Silk, FNP, patient is in need of higher level of care due to Facial infection. Patient is aware and verbalizes understanding of plan of care.  Vitals:   04/22/24 1342  BP: (!) 142/80  Pulse: 83  Resp: 17  Temp: 98.5 F (36.9 C)  SpO2: 97%

## 2024-04-22 NOTE — ED Provider Notes (Addendum)
 [place] EMERGENCY DEPARTMENT AT [facility] Provider Note   CSN: [id number] Arrival date & time: 04/22/24  1415     Patient presents with: Wound Check   Christy [name] is a 83 y.o. female.   Patient here with right-sided facial swelling.  Started over the right side of her naris spreading down to her right cheek up her nose and around the eye.  She has a history of diabetes Parkinson's rheumatoid arthritis.  Patient on metformin.  She has maybe had fever.  Swelling and redness has been there for the last couple days.  She denies any nausea vomit diarrhea.  No issues with her vision.  The history is provided by the patient.       Prior to Admission medications   Medication Sig Start Date End Date Taking? Authorizing Provider  Bempedoic Acid-Ezetimibe (NEXLIZET ) 180-10 MG TABS Take 1 tablet by mouth daily. 04/01/23   Lucien Orren SAILOR, PA-C  Bempedoic Acid-Ezetimibe (NEXLIZET ) 180-10 MG TABS Take by mouth.    [provider]  Calcium  Citrate 150 MG CAPS Take 1,000 mg by mouth daily.      [provider]  carbidopa -levodopa  (SINEMET  IR) 25-100 MG tablet Take 1 tablet by mouth 3 (three) times daily. 12/05/23   Buck Saucer, MD  celecoxib  (CELEBREX ) 100 MG capsule Take 1 capsule (100 mg total) by mouth daily as needed. 05/23/23   Brooks, Dana, DO  Cholecalciferol (VITAMIN D ) 1000 UNITS capsule Take 1,000 Units by mouth daily.      [provider]  FLUoxetine (PROZAC) 20 MG tablet Take 20 mg by mouth daily. 10/25/23   [provider]  folic acid  (FOLVITE ) 1 MG tablet Take 1 mg by mouth daily.      [provider]  glucose blood (TRUE METRIX BLOOD GLUCOSE TEST) test strip 1 each by Other route as directed. Use as instructed 07/12/22   Berneta Elsie Sayre, MD  MAGNESIUM PO Take 500 mg by mouth at bedtime.    [provider]  metFORMIN (GLUCOPHAGE) 500 MG tablet Take 500 mg by mouth 2 (two) times daily with a meal.      [provider]  methotrexate (RHEUMATREX) 2.5 MG tablet Take 2.5 mg by mouth. Caution:Chemotherapy. Protect from light.     [provider]  MULTIPLE VITAMIN PO Take 1 tablet by mouth daily.      [provider]  Omega-3 Fatty Acids (FISH OIL PO) Take by mouth.    [provider]  PSYLLIUM PO Take 8-10 capsules by mouth daily.      [provider]  RYBELSUS  14 MG TABS Take 14 mg by mouth daily. 01/13/23   [provider]    Allergies: Atorvastatin , Epinephrine, Erythromycin, Gluten, Penicillins, Rosuvastatin , and Tetracyclines & related    Review of Systems  Updated Vital Signs BP (!) 128/48 (BP Location: Right Arm)   Pulse 82   Temp 99.8 F (37.7 C) (Oral)   Resp 18   Ht 5' 3 (1.6 m)   Wt 65.3 kg   SpO2 98%   BMI 25.51 kg/m   Physical Exam Vitals and nursing note reviewed.  Constitutional:      General: She is not in acute distress.    Appearance: She is well-developed. She is not ill-appearing.  HENT:     Head: Normocephalic and atraumatic.  Eyes:     Conjunctiva/sclera: Conjunctivae normal.  Cardiovascular:     Rate and Rhythm: Normal rate and regular rhythm.  Heart sounds: No murmur heard. Pulmonary:     Effort: Pulmonary effort is normal. No respiratory distress.     Breath sounds: Normal breath sounds.  Abdominal:     Palpations: Abdomen is soft.     Tenderness: There is no abdominal tenderness.  Musculoskeletal:        General: No swelling.     Cervical back: Neck supple.  Skin:    General: Skin is warm and dry.     Capillary Refill: Capillary refill takes less than 2 seconds.     Comments: Significant redness and swelling to the right side of the nare to the right cheek and over the right upper lip area does not appear to involve the eye, she has got a little bit of skin breakdown in the right nostril area as well  Neurological:     Mental Status: She is alert.  Psychiatric:        Mood and Affect: Mood normal.      (all labs ordered are listed, but only abnormal results are displayed) Labs Reviewed  COMPREHENSIVE METABOLIC PANEL WITH GFR - Abnormal; Notable for the following components:      Result Value   Sodium 134 (*)    CO2 19 (*)    Glucose, Bld 198 (*)    Creatinine, Ser 1.09 (*)    GFR, Estimated 50 (*)    All other components within normal limits  CBC WITH DIFFERENTIAL/PLATELET - Abnormal; Notable for the following components:   RBC 3.21 (*)    Hemoglobin 10.1 (*)    HCT 30.1 (*)    All other components within normal limits  URINALYSIS, W/ REFLEX TO CULTURE (INFECTION SUSPECTED) - Abnormal; Notable for the following components:   Hgb urine dipstick SMALL (*)    Protein, ur TRACE (*)    Bacteria, UA RARE (*)    All other components within normal limits  CBG MONITORING, ED - Abnormal; Notable for the following components:   Glucose-Capillary 176 (*)    All other components within normal limits  RESP PANEL BY RT-PCR (RSV, FLU A&B, COVID)  RVPGX2  CULTURE, BLOOD (ROUTINE X 2)  CULTURE, BLOOD (ROUTINE X 2)  LACTIC ACID, PLASMA  PROTIME-INR    EKG: None  Radiology: CT Maxillofacial W Contrast Result Date: 04/22/2024 EXAM: CT Face with contrast 04/22/2024 05:18:28 PM TECHNIQUE: CT of the face was performed with the administration of intravenous contrast. Multiplanar reformatted images are provided for review. Automated exposure control, iterative reconstruction, and/or weight based adjustment of the mA/kV was utilized to reduce the radiation dose to as low as reasonably achievable. COMPARISON: None available CLINICAL HISTORY: Infection, facial swelling, erythema, and nasal lesion that initially began 1 week ago. FINDINGS: AERODIGESTIVE TRACT: No mass. No edema. SALIVARY GLANDS: No acute abnormality. LYMPH NODES: No suspicious cervical lymphadenopathy. SOFT TISSUES: A cutaneous lesion along the right side of the nose measures 9 x 4 mm. There is some stranding of the subcutaneous  tissues. No discrete abscess is present. BRAIN, ORBITS AND SINUSES: Mild mucosal thickening is present within the right maxillary sinus. BONES: No acute abnormality. No suspicious bone lesion. No underlying osseous lesion or erosion is present. VASCULATURE: Atherosclerotic calcifications are present in the cavernous carotid arteries bilaterally and at the dural margin of both vertebral arteries. No hyperdense vessel is present. IMPRESSION: 1. Cutaneous lesion along the right side of the nose measuring 9 x 4 mm with associated subcutaneous stranding. No deeper abscess or underlying osseous erosion. 2. Mild mucosal  thickening in the right maxillary sinus. Electronically signed by: Lonni Necessary MD 04/22/2024 06:46 PM EDT RP Workstation: [id number]   DG Chest Port 1 View Result Date: 04/22/2024 CLINICAL DATA:  Sepsis EXAM: PORTABLE CHEST 1 VIEW COMPARISON:  None Available. FINDINGS: Cardiac and mediastinal margins appear normal. Mildly accentuated retrodiaphragmatic density, cannot exclude left lower lobe atelectasis or pneumonia. The right lung appears clear.  Right axillary clips noted. Bony demineralization is present. Atherosclerotic calcification of the aortic arch. IMPRESSION: 1. Mildly accentuated retrodiaphragmatic density, cannot exclude left lower lobe atelectasis or pneumonia. Consider lateral chest radiography, if feasible. 2. Bony demineralization. 3. Right axillary clips noted. 4.  Aortic Atherosclerosis (ICD10-I70.0). Electronically Signed   By: Ryan Salvage M.D.   On: 04/22/2024 17:19     Procedures   Medications Ordered in the ED  lactated ringers  infusion (has no administration in time range)  cefTRIAXone  (ROCEPHIN ) 2 g in sodium chloride  0.9 % 100 mL IVPB (0 g Intravenous Stopped 04/22/24 1834)  vancomycin  (VANCOCIN ) IVPB 1000 mg/200 mL premix (0 mg Intravenous Stopped 04/22/24 1757)  lactated ringers  bolus 1,000 mL (0 mLs Intravenous Stopped 04/22/24 1800)  acetaminophen   (TYLENOL ) tablet 1,000 mg (1,000 mg Oral Given 04/22/24 1606)  sodium chloride  0.9 % bolus 1,000 mL ( Intravenous Stopped 04/22/24 1908)  iohexol  (OMNIPAQUE ) 300 MG/ML solution 100 mL (100 mLs Intravenous Contrast Given 04/22/24 1707)                                    Medical Decision Making Amount and/or Complexity of Data Reviewed Labs: ordered. Radiology: ordered.  Risk OTC drugs. Prescription drug management. Decision regarding hospitalization.   Niels Sable here with facial swelling.  Overall looks like a pretty significant facial cellulitis looks like it started on the right nare/outside of the right side of the nose now extending through most of the nose and underneath the right eye and cheekbone.  Will pursue sepsis workup given that she is low-grade fever here 99.8.  She is a diabetic.  She is on methotrexate as well for rheumatoid arthritis.  Ultimately will start broad-spectrum IV antibiotics get blood cultures get a CT scan of the face to look for any abscess or surgical process.  There does not appear to be any ocular involvement at this time.  Will check basic labs lactic acid.  Anticipate admission.  Overall lab work shows no significant leukocytosis or lactic acidosis.  Blood sugar 176.  CT scan of the face does show cutaneous lesion along the right side of the nose measuring 9 x 4 mm with associated subcutaneous stranding but no deeper abscess or underlying osseous erosion.  Overall I do not think there is any drainable abscess at this time but I do think she needs IV antibiotics to make sure that this improves.  Will admit to hospitalist for further care.  She does not meet sepsis criteria at this time.  This chart was dictated using voice recognition software.  Despite best efforts to proofread,  errors can occur which can change the documentation meaning.      Final diagnoses:  Facial cellulitis    ED Discharge Orders     None          Ruthe Cornet,  DO 04/22/24 1914    Ruthe Cornet, DO 04/22/24 1920

## 2024-04-22 NOTE — ED Triage Notes (Signed)
 Wound with erythema, swelling and dark area to right nare, nose, and right cheek. Onset 1 week ago.

## 2024-04-22 NOTE — Hospital Course (Signed)
 Christy [name] is a 83 y.o. female with medical history significant for Parkinson's disease, T2DM, HLD, rheumatoid arthritis on methotrexate, PAT, breast cancer s/p right mastectomy who is admitted with right facial cellulitis.

## 2024-04-22 NOTE — H&P (Signed)
 History and Physical    Christy [name] FMW:[id number] DOB: [birth year] DOA: 04/22/2024  PCP: Cristopher Bottcher, NP  Patient coming from: Home  I have personally briefly reviewed patient's old medical records in [facility]  Chief Complaint: Swelling and redness of right side of the face  HPI: Christy [name] is a 83 y.o. female with medical history significant for Parkinson's disease, T2DM, HLD, rheumatoid arthritis on methotrexate, PAT, breast cancer s/p right mastectomy who presented to the ED for evaluation of swelling and redness involving right side of the face.  Patient states that 1 week ago she noticed a spot on her right nares which she picked at.  The spot turned black a few days ago.  Over the last 2 days she has noticed swelling around the area which is now involving the nose, right cheek/maxillary area, and upper lip.  She has not seen any improvement therefore went to the ED for further evaluation.  She denies any fevers, chills, diaphoresis.  She denies any change in vision.  She denies any swelling of tongue or trouble controlling her oral secretions.  [facility] ED Course  Labs/Imaging on admission: I have personally reviewed following labs and imaging studies.  Initial vitals showed BP 121/65, pulse 82, RR 17, temp 99.8 F, SpO2 98% on room air.  Labs showed WBC 5.0, hemoglobin 10.1, platelets 219, sodium 134, potassium 3.6, bicarb 19, BUN 17, creatinine 1.09, serum glucose 198, LFTs within normal limits, lactic acid 0.9.  SARS-CoV-2, influenza, RSV PCR negative.  UA negative for UTI.  Blood cultures in process.  CT maxillofacial with contrast showed cutaneous lesion along the right side of the nose measuring 9 x 4 mm with associated subcutaneous stranding.  No deeper abscess or underlying osseous erosion.  Portable chest x-ray showed mildly accentuated retrodiaphragmatic density, bony mineralization, right axillary clips.  Patient was given 2 L IV fluid, IV  vancomycin  and ceftriaxone .  The hospitalist service was consulted for admission.  Review of Systems: All systems reviewed and are negative except as documented in history of present illness above.   Past Medical History:  Diagnosis Date   Agatston coronary artery calcium  score greater than 400    Ca score 1121 in July 2022   Allergy    Arthritis    Chronic constipation    Depression    Diabetes mellitus without complication (HCC)    History of breast cancer    HLD (hyperlipidemia)    Parkinson disease (HCC)    PAT (paroxysmal atrial tachycardia) (HCC)    Ziop[place] 02/2021   Pulmonary insufficiency    moderate by echo 11/23   Rheumatoid arthritis (HCC)     Past Surgical History:  Procedure Laterality Date   ABDOMINAL HYSTERECTOMY     ACHILLES TENDON REPAIR     BREAST REDUCTION SURGERY Left    CATARACT EXTRACTION     masectomy Right    TONSILLECTOMY AND ADENOIDECTOMY      Social History: Social History   Tobacco Use   Smoking status: Never   Smokeless tobacco: Never  Vaping Use   Vaping status: Never Used  Substance Use Topics   Alcohol use: Not Currently   Drug use: Not Currently    Allergies  Allergen Reactions   Atorvastatin      Joint pain on 20mg  daily   Epinephrine    Erythromycin    Gluten Swelling    Feet will swell.  Patient states no longer an issue.   Penicillins  Rosuvastatin      Myalgias and joint pain on 5mg  daily   Tetracyclines & Related     Family History  Problem Relation Age of Onset   Arthritis Mother    Dementia Mother    Stroke Mother    Alcohol abuse Father    Cancer Father        lung   Arthritis Sister    Cancer Sister    Early death Sister    Arthritis Maternal Grandmother    Hearing loss Maternal Grandmother    Heart attack Maternal Grandfather    Learning disabilities Son    Depression Son    Diabetes Son    Parkinson's disease Neg Hx      Prior to Admission medications   Medication Sig Start Date End Date  Taking? Authorizing Provider  Bempedoic Acid-Ezetimibe (NEXLIZET ) 180-10 MG TABS Take 1 tablet by mouth daily. 04/01/23   Lucien Orren SAILOR, PA-C  Bempedoic Acid-Ezetimibe (NEXLIZET ) 180-10 MG TABS Take by mouth.    [provider]  Calcium  Citrate 150 MG CAPS Take 1,000 mg by mouth daily.      [provider]  carbidopa -levodopa  (SINEMET  IR) 25-100 MG tablet Take 1 tablet by mouth 3 (three) times daily. 12/05/23   Athar, Saima, MD  celecoxib  (CELEBREX ) 100 MG capsule Take 1 capsule (100 mg total) by mouth daily as needed. 05/23/23   Brooks, Dana, DO  Cholecalciferol (VITAMIN D ) 1000 UNITS capsule Take 1,000 Units by mouth daily.      [provider]  FLUoxetine (PROZAC) 20 MG tablet Take 20 mg by mouth daily. 10/25/23   [provider]  folic acid  (FOLVITE ) 1 MG tablet Take 1 mg by mouth daily.      [provider]  glucose blood (TRUE METRIX BLOOD GLUCOSE TEST) test strip 1 each by Other route as directed. Use as instructed 07/12/22   Berneta Elsie Sayre, MD  MAGNESIUM PO Take 500 mg by mouth at bedtime.    [provider]  metFORMIN (GLUCOPHAGE) 500 MG tablet Take 500 mg by mouth 2 (two) times daily with a meal.      [provider]  methotrexate (RHEUMATREX) 2.5 MG tablet Take 2.5 mg by mouth. Caution:Chemotherapy. Protect from light.     [provider]  MULTIPLE VITAMIN PO Take 1 tablet by mouth daily.      [provider]  Omega-3 Fatty Acids (FISH OIL PO) Take by mouth.    [provider]  PSYLLIUM PO Take 8-10 capsules by mouth daily.      [provider]  RYBELSUS  14 MG TABS Take 14 mg by mouth daily. 01/13/23   [provider]    Physical Exam: Vitals:   04/22/24 1434 04/22/24 1803 04/22/24 1805 04/22/24 2147  BP:  (!) 128/48 (!) 128/48 (!) 143/65  Pulse:   82 73  Resp:  15 18 16   Temp:   99.8 F (37.7 C) 99.3 F (37.4 C)  TempSrc:   Oral Oral  SpO2:   98% 99%  Weight: 65.3  kg     Height: 5' 3 (1.6 m)      Constitutional: Resting in bed, NAD, calm, comfortable Eyes: EOMI, lids and conjunctivae normal ENMT: Swelling of upper lip.  Speaking in full clear sentences, controlling oral secretions.  No stridor.  Mucous membranes are moist. Posterior pharynx clear of any exudate or lesions.Normal dentition.  Neck: normal, supple, no masses. Respiratory: clear to auscultation bilaterally, no wheezing, no crackles.  Normal respiratory effort. No accessory muscle use.  Cardiovascular: Regular rate and rhythm, no murmurs / rubs / gallops. No extremity edema. 2+ pedal pulses. Abdomen: no tenderness, no masses palpated. Musculoskeletal: no clubbing / cyanosis. No joint deformity upper and lower extremities. Good ROM, no contractures. Normal muscle tone.  Skin: Black skin lesion at right naris without active discharge.  Swelling and erythema of the face involving nose, right cheek, and upper lip.  See pictures below. Neurologic: Sensation intact. Strength 5/5 in all 4.  Psychiatric: Normal judgment and insight. Alert and oriented x 3. Normal mood.       EKG: Personally reviewed. Sinus rhythm, rate 77, QTc 492.  Assessment/Plan Principal Problem:   Facial cellulitis Active Problems:   Rheumatoid arthritis (HCC)   Type 2 diabetes mellitus with diabetic neuropathy, without long-term current use of insulin  (HCC)   Parkinson disease (HCC)   Christy [name] is a 83 y.o. female with medical history significant for Parkinson's disease, T2DM, HLD, rheumatoid arthritis on methotrexate, PAT, breast cancer s/p right mastectomy who is admitted with right facial cellulitis.  Assessment and Plan: Right facial cellulitis: Involving nose, right cheek, upper lip.  Small lesion with eschar over right nares.  No active discharge.  Afebrile and hemodynamically stable.  Immunosuppressed on methotrexate. - Continue IV vancomycin  and ceftriaxone  - Follow-up blood cultures  Type 2  diabetes: Placed on SSI.  Parkinson's disease: Continue Sinemet  IR.  Rheumatoid arthritis: Holding methotrexate.   DVT prophylaxis: enoxaparin  (LOVENOX ) injection 40 mg Start: 04/23/24 1000 Code Status: Full code, confirmed with patient on admission Family Communication: Discussed with patient, she has discussed with family Disposition Plan: From home and likely discharge to home pending clinical progress Consults called: None Severity of Illness: The appropriate patient status for this patient is INPATIENT. Inpatient status is judged to be reasonable and necessary in order to provide the required intensity of service to ensure the patient's safety. The patient's presenting symptoms, physical exam findings, and initial radiographic and laboratory data in the context of their chronic comorbidities is felt to place them at high risk for further clinical deterioration. Furthermore, it is not anticipated that the patient will be medically stable for discharge from the hospital within 2 midnights of admission.   * I certify that at the point of admission it is my clinical judgment that the patient will require inpatient hospital care spanning beyond 2 midnights from the point of admission due to high intensity of service, high risk for further deterioration and high frequency of surveillance required.DEWAINE Jorie Blanch MD [facility]  If 7PM-7AM, please contact night-coverage [email or web address]  04/22/2024, 10:47 PM

## 2024-04-22 NOTE — ED Provider Notes (Signed)
 Patient presents to urgent care for evaluation of facial swelling, erythema, and nasal lesion that initially began 1 week ago.  Symptoms started with a red pimple appearing lesion to the right nose. Red pimple became black 2 days ago. Erythema has been more pronounced and warm to touch over the last 24 hours per patient's husband. Denies recent fever, chills, pain associated with swelling, nasal congestion, neck pain, visual changes, and changes in mentation from baseline.   Significant erythema, swelling, and warmth to the nose, right face, and upper lip as seen in images below.  Patient would benefit from further workup and evaluation in the ER to rule out deep soft tissue space infection and for likely IV antibiotic therapy due to severity of infectious process.   Discussed clinical concerns/exam findings leading to recommendation for further workup in the ER setting and risks of deferring ER visit with patient/family. Patient/family express understanding and agreement with plan, discharged to ER via private car with husband.          Enedelia Dorna HERO, [place] 04/22/24 1359

## 2024-04-22 NOTE — Progress Notes (Signed)
 Elink following for sepsis protocol.

## 2024-04-22 NOTE — Plan of Care (Addendum)
 [facility] emergency department to [facility] medical telemetry  unit transfer:  83 year old female history of essential hypertension, history of frequent PVC/PAC, Parkinson disease, rheumatoid arthritis, DM type II, chronic atrial tachycardia, history of breast cancer s/p mastectomy and memory impairment presented to emergency department right-sided facial swelling.Started over the right side of her naris spreading down to her right cheek up her nose and around the eye.  Patient unsure about having fever.  Reported swelling and redness has been there for last couple of days.  At presentation to ED patient is hemodynamically stable.  Afebrile. CBC no evidence of leukocytosis stable H&H normal platelet count.  Normal lactic acid level.  CMP showing low sodium 134, low bicarb 19, elevated creatinine 1.09 and GFR 50.  Blood cultures are in process.  Respiratory panel negative for COVID RSV flu.  UA no evidence of UTI.  CT maxillofacial: Subcutaneous soft tissues stranding secondary to infection however no evidence of abscess formation or bony erosion. Impression: 1. Cutaneous lesion along the right side of the nose measuring 9 x 4 mm with associated subcutaneous stranding. No deeper abscess or underlying osseous erosion. 2. Mild mucosal thickening in the right maxillary sinus.  Chest x-ray: IMPRESSION: 1. Mildly accentuated retrodiaphragmatic density, cannot exclude left lower lobe atelectasis or pneumonia. Consider lateral chest radiography, if feasible. 2. Bony demineralization. 3. Right axillary clips noted. 4.  Aortic Atherosclerosis (ICD10-I70.0).  Dr. Ruthe stated patient has a small scab outside of her naris however there is no internal nasal discharge or blackish discoloration.  In the ED patient received 1 L of LR bolus currently on maintenance fluid LR.  Also received ceftriaxone  and vancomycin .  Hospitalist has been consulted for management of facial cellulitis,  AKI, hyponatremia and metabolic acidosis.  Christia Coaxum, MD [facility] 04/22/2024, 7:22 PM

## 2024-04-22 NOTE — ED Triage Notes (Signed)
 Pt presents with spot on right side of nose for about 1 week. Spot was red initial and today it is black. Pt is also having swelling and redness on right side of face.

## 2024-04-23 ENCOUNTER — Other Ambulatory Visit (HOSPITAL_COMMUNITY): Payer: Self-pay

## 2024-04-23 DIAGNOSIS — L03211 Cellulitis of face: Secondary | ICD-10-CM | POA: Diagnosis not present

## 2024-04-23 LAB — CBC
HCT: 28.9 % — ABNORMAL LOW (ref 36.0–46.0)
Hemoglobin: 9.5 g/dL — ABNORMAL LOW (ref 12.0–15.0)
MCH: 31.6 pg (ref 26.0–34.0)
MCHC: 32.9 g/dL (ref 30.0–36.0)
MCV: 96 fL (ref 80.0–100.0)
Platelets: 197 K/uL (ref 150–400)
RBC: 3.01 MIL/uL — ABNORMAL LOW (ref 3.87–5.11)
RDW: 14.7 % (ref 11.5–15.5)
WBC: 4.6 K/uL (ref 4.0–10.5)
nRBC: 0 % (ref 0.0–0.2)

## 2024-04-23 LAB — BASIC METABOLIC PANEL WITH GFR
Anion gap: 13 (ref 5–15)
BUN: 13 mg/dL (ref 8–23)
CO2: 21 mmol/L — ABNORMAL LOW (ref 22–32)
Calcium: 9.2 mg/dL (ref 8.9–10.3)
Chloride: 108 mmol/L (ref 98–111)
Creatinine, Ser: 0.84 mg/dL (ref 0.44–1.00)
GFR, Estimated: >60 mL/min (ref >60)
Glucose, Bld: 125 mg/dL — ABNORMAL HIGH (ref 70–99)
Potassium: 3.2 mmol/L — ABNORMAL LOW (ref 3.5–5.1)
Sodium: 141 mmol/L (ref 135–145)

## 2024-04-23 LAB — GLUCOSE, CAPILLARY
Glucose-Capillary: 120 mg/dL — ABNORMAL HIGH (ref 70–99)
Glucose-Capillary: 206 mg/dL — ABNORMAL HIGH (ref 70–99)
Glucose-Capillary: 122 mg/dL — ABNORMAL HIGH (ref 70–99)
Glucose-Capillary: 171 mg/dL — ABNORMAL HIGH (ref 70–99)

## 2024-04-23 LAB — HEMOGLOBIN A1C
Hgb A1c MFr Bld: 6.2 % — ABNORMAL HIGH (ref 4.8–5.6)
Hgb A1c MFr Bld: 6.3 % — ABNORMAL HIGH (ref 4.8–5.6)
Mean Plasma Glucose: 131.24 mg/dL
Mean Plasma Glucose: 134.11 mg/dL

## 2024-04-23 MED ORDER — CEFDINIR 300 MG PO CAPS
300.0000 mg | ORAL_CAPSULE | Freq: Two times a day (BID) | ORAL | 0 refills | Status: DC
  Filled 2024-04-23: qty 16, 8d supply, fill #0

## 2024-04-23 MED ORDER — LINEZOLID 600 MG PO TABS
600.0000 mg | ORAL_TABLET | Freq: Two times a day (BID) | ORAL | 0 refills | Status: AC
Start: 2024-04-23 — End: 2024-05-02
  Filled 2024-04-23: qty 16, 8d supply, fill #0

## 2024-04-23 MED ORDER — PROBIOTIC 1-250 BILLION-MG PO CAPS
1.0000 | ORAL_CAPSULE | Freq: Every day | ORAL | 0 refills | Status: AC
  Filled 2024-04-23: qty 10, 10d supply, fill #0

## 2024-04-23 MED ORDER — DIAZEPAM 5 MG/ML IJ SOLN
2.5000 mg | Freq: Once | INTRAMUSCULAR | Status: AC
  Administered 2024-04-23: 2.5 mg via INTRAVENOUS
  Filled 2024-04-23: qty 2

## 2024-04-23 MED ORDER — POTASSIUM CHLORIDE CRYS ER 20 MEQ PO TBCR
30.0000 meq | EXTENDED_RELEASE_TABLET | Freq: Two times a day (BID) | ORAL | Status: AC
  Administered 2024-04-23 (×2): 30 meq via ORAL
  Filled 2024-04-23 (×2): qty 1

## 2024-04-23 NOTE — Progress Notes (Signed)
   04/23/24 0957  TOC Brief Assessment  Insurance and Status Reviewed  Patient has primary care physician Yes  Home environment has been reviewed resides in private residence  Prior level of function: Independent  Prior/Current Home Services No current home services  Social Drivers of Health Review SDOH reviewed no interventions necessary  Readmission risk has been reviewed Yes  Transition of care needs no transition of care needs at this time

## 2024-04-23 NOTE — Plan of Care (Signed)

## 2024-04-23 NOTE — Discharge Summary (Incomplete)
 Physician Discharge Summary   Patient: Christy [name] MRN: [id number] DOB: February 05, 1941  Admit date:     04/22/2024  Discharge date: 04/23/24  Discharge Physician: Ellouise SHAUNNA Ra   PCP: Cristopher Bottcher, NP   Recommendations at discharge:  {Tip this will not be part of the note when signed- Example include specific recommendations for outpatient follow-up, pending tests to follow-up on. (Optional):26781} Follow-up with PCP in 3 days  Discharge Diagnoses: Principal Problem:   Facial cellulitis Active Problems:   Rheumatoid arthritis (HCC)   Type 2 diabetes mellitus with diabetic neuropathy, without long-term current use of insulin  (HCC)   Parkinson disease [facility])   Hospital Course: Christy [name] is a 83 y.o. female with medical history significant for Parkinson's disease, T2DM, HLD, rheumatoid arthritis on methotrexate, PAT, breast cancer s/p right mastectomy who is admitted with right facial cellulitis.  Patient was initiated on IV antibiotics vancomycin  and ceftriaxone  which she tolerated well.  Patient on the following day felt she had improved and wanted to get discharged.  I recommended against it and advised that the patient should continue on IV antibiotics and follow-up on the cultures.  Patient however declined and said that she had to attend a family dinner.  I advised her that infection on the face can get significantly worse and lead to brain infections at times and it is important that she continue with IV antibiotics until the cellulitis is shows some more improvement.  However patient declined it and decided to sign AGAINST MEDICAL ADVICE.  I have called and oral antibiotics for another 8 more days including cefdinir  and linezolid  and probiotic.  Advised that patient should seek medical advice within the next 2 to 3 days with her primary care physician to make sure that the cellulitis is continuing to improve I also advised that if she notices any worsening of redness, fever,  headaches nausea vomiting she should return back to the ER immediately.      {Tip this will not be part of the note when signed Body mass index is 25.51 kg/m. , ,  (Optional):26781}  {(NOTE) Pain control PDMP Statment (Optional):26782} Consultants: None Procedures performed: None Disposition: Signed AGAINST MEDICAL ADVICE Diet recommendation:  Carb modified diet DISCHARGE MEDICATION: Allergies as of 04/23/2024       Reactions   Penicillins Hives, Rash   Atorvastatin  Other (See Comments)   Joint pain on 20mg  daily   Epinephrine Other (See Comments)   Patient is unsure of reaction    Erythromycin Other (See Comments)   Patient is unsure of reaction    Rosuvastatin  Other (See Comments)   Myalgias and joint pain on 5mg  daily   Tetracyclines & Related Other (See Comments)   Patient is unsure of reaction      Med Rec must be completed prior to using this SMARTLINK***       Follow-up Information     Cristopher Bottcher, NP Follow up in 3 day(s).   Specialty: Family Medicine Contact information: [name] [place] [address] [phone]                Discharge Exam: [name]   04/22/24 1434  Weight: 65.3 kg   Patient is alert and oriented: HEENT: Erythema involving the right side of the face with swelling, mild tenderness and eschar in the nose CVS: S1-S2 positive  Condition at discharge: fair  The results of significant diagnostics from this hospitalization (including imaging, microbiology, ancillary and laboratory) are listed below for  reference.   Imaging Studies: CT Maxillofacial W Contrast Result Date: 04/22/2024 EXAM: CT Face with contrast 04/22/2024 05:18:28 PM TECHNIQUE: CT of the face was performed with the administration of intravenous contrast. Multiplanar reformatted images are provided for review. Automated exposure control, iterative reconstruction, and/or weight based adjustment of the mA/kV was utilized to reduce the  radiation dose to as low as reasonably achievable. COMPARISON: None available CLINICAL HISTORY: Infection, facial swelling, erythema, and nasal lesion that initially began 1 week ago. FINDINGS: AERODIGESTIVE TRACT: No mass. No edema. SALIVARY GLANDS: No acute abnormality. LYMPH NODES: No suspicious cervical lymphadenopathy. SOFT TISSUES: A cutaneous lesion along the right side of the nose measures 9 x 4 mm. There is some stranding of the subcutaneous tissues. No discrete abscess is present. BRAIN, ORBITS AND SINUSES: Mild mucosal thickening is present within the right maxillary sinus. BONES: No acute abnormality. No suspicious bone lesion. No underlying osseous lesion or erosion is present. VASCULATURE: Atherosclerotic calcifications are present in the cavernous carotid arteries bilaterally and at the dural margin of both vertebral arteries. No hyperdense vessel is present. IMPRESSION: 1. Cutaneous lesion along the right side of the nose measuring 9 x 4 mm with associated subcutaneous stranding. No deeper abscess or underlying osseous erosion. 2. Mild mucosal thickening in the right maxillary sinus. Electronically signed by: Lonni Necessary MD 04/22/2024 06:46 PM EDT RP Workstation: [id number]   DG Chest Port 1 View Result Date: 04/22/2024 CLINICAL DATA:  Sepsis EXAM: PORTABLE CHEST 1 VIEW COMPARISON:  None Available. FINDINGS: Cardiac and mediastinal margins appear normal. Mildly accentuated retrodiaphragmatic density, cannot exclude left lower lobe atelectasis or pneumonia. The right lung appears clear.  Right axillary clips noted. Bony demineralization is present. Atherosclerotic calcification of the aortic arch. IMPRESSION: 1. Mildly accentuated retrodiaphragmatic density, cannot exclude left lower lobe atelectasis or pneumonia. Consider lateral chest radiography, if feasible. 2. Bony demineralization. 3. Right axillary clips noted. 4.  Aortic Atherosclerosis (ICD10-I70.0). Electronically Signed   By:  Ryan Salvage M.D.   On: 04/22/2024 17:19    Microbiology: Results for orders placed or performed during the hospital encounter of 04/22/24  Blood Culture (routine x 2)     Status: None (Preliminary result)   Collection Time: 04/22/24  2:41 PM   Specimen: BLOOD LEFT ARM  Result Value Ref Range Status   Specimen Description   Final    BLOOD LEFT ARM Performed at [facility], [address], [place], [place] [address]    Special Requests   Final    BOTTLES DRAWN AEROBIC AND ANAEROBIC Blood Culture results may not be optimal due to an inadequate volume of blood received in culture bottles Performed at [facility], [address], [place], [place] [address]    Culture   Final    NO GROWTH < 24 HOURS Performed at [facility], [address], [place], [place] [address]    Report Status PENDING  Incomplete  Resp panel by RT-PCR (RSV, Flu A&B, Covid) Anterior Nasal Swab     Status: None   Collection Time: 04/22/24  4:05 PM   Specimen: Anterior Nasal Swab  Result Value Ref Range Status   SARS Coronavirus 2 by RT PCR NEGATIVE NEGATIVE Final    Comment: (NOTE) SARS-CoV-2 target nucleic acids are NOT DETECTED.  The SARS-CoV-2 RNA is generally detectable in upper respiratory specimens during the acute phase of infection. The lowest concentration of SARS-CoV-2 viral copies this assay can detect is 138 copies/mL. A negative result does  not preclude SARS-Cov-2 infection and should not be used as the sole basis for treatment or other patient management decisions. A negative result may occur with  improper specimen collection/handling, submission of specimen other than nasopharyngeal swab, presence of viral mutation(s) within the areas targeted by this assay, and inadequate number of viral copies(<138 copies/mL). A negative result must be combined with clinical observations, patient history, and epidemiological information. The expected result is  Negative.  Fact Sheet for Patients:  BloggerCourse.com  Fact Sheet for Healthcare Providers:  SeriousBroker.it  This test is no t yet approved or cleared by the [place]  FDA and  has been authorized for detection and/or diagnosis of SARS-CoV-2 by FDA under an Emergency Use Authorization (EUA). This EUA will remain  in effect (meaning this test can be used) for the duration of the COVID-19 declaration under Section 564(b)(1) of the Act, 21 U.S.C.section 360bbb-3(b)(1), unless the authorization is terminated  or revoked sooner.       Influenza A by PCR NEGATIVE NEGATIVE Final   Influenza B by PCR NEGATIVE NEGATIVE Final    Comment: (NOTE) The Xpert Xpress SARS-CoV-2/FLU/RSV plus assay is intended as an aid in the diagnosis of influenza from Nasopharyngeal swab specimens and should not be used as a sole basis for treatment. Nasal washings and aspirates are unacceptable for Xpert Xpress SARS-CoV-2/FLU/RSV testing.  Fact Sheet for Patients: BloggerCourse.com  Fact Sheet for Healthcare Providers: SeriousBroker.it  This test is not yet approved or cleared by the [place]  FDA and has been authorized for detection and/or diagnosis of SARS-CoV-2 by FDA under an Emergency Use Authorization (EUA). This EUA will remain in effect (meaning this test can be used) for the duration of the COVID-19 declaration under Section 564(b)(1) of the Act, 21 U.S.C. section 360bbb-3(b)(1), unless the authorization is terminated or revoked.     Resp Syncytial Virus by PCR NEGATIVE NEGATIVE Final    Comment: (NOTE) Fact Sheet for Patients: BloggerCourse.com  Fact Sheet for Healthcare Providers: SeriousBroker.it  This test is not yet approved or cleared by the [place]  FDA and has been authorized for detection and/or diagnosis of  SARS-CoV-2 by FDA under an Emergency Use Authorization (EUA). This EUA will remain in effect (meaning this test can be used) for the duration of the COVID-19 declaration under Section 564(b)(1) of the Act, 21 U.S.C. section 360bbb-3(b)(1), unless the authorization is terminated or revoked.  Performed at [facility], [address] [place], [place] [address]   Blood Culture (routine x 2)     Status: None (Preliminary result)   Collection Time: 04/22/24  4:05 PM   Specimen: BLOOD  Result Value Ref Range Status   Specimen Description   Final    BLOOD RIGHT ANTECUBITAL Performed at [facility], [address] [place], [place] [address]    Special Requests   Final    BOTTLES DRAWN AEROBIC AND ANAEROBIC Blood Culture adequate volume Performed at [facility], [address] [place], [place] [address]    Culture   Final    NO GROWTH < 24 HOURS Performed at [facility], [address] [place], [place] [address]    Report Status PENDING  Incomplete    Labs: CBC: Recent Labs  Lab 04/22/24 1441 04/23/24 0408  WBC 5.0 4.6  NEUTROABS 3.8  --   HGB 10.1* 9.5*  HCT 30.1* 28.9*  MCV 93.8 96.0  PLT 219 197   Basic Metabolic Panel: Recent Labs  Lab 04/22/24 1441 04/23/24  0408  NA 134* 141  K 3.6 3.2*  CL 101 108  CO2 19* 21*  GLUCOSE 198* 125*  BUN 17 13  CREATININE 1.09* 0.84  CALCIUM  9.8 9.2   Liver Function Tests: Recent Labs  Lab 04/22/24 1441  AST 17  ALT 9  ALKPHOS 52  BILITOT 0.7  PROT 7.1  ALBUMIN 4.0   CBG: Recent Labs  Lab 04/22/24 1724 04/22/24 2212 04/23/24 0719 04/23/24 1122  GLUCAP 176* 167* 120* 206*    Discharge time spent: greater than 30 minutes.  Signed: Ellouise SHAUNNA Ra, MD [facility] 04/23/2024

## 2024-04-23 NOTE — Progress Notes (Signed)
  Progress Note   Patient: Christy [name] FMW:[id number] DOB: [birth year] DOA: 04/22/2024     1 DOS: the patient was seen and examined on 04/23/2024   Brief hospital course: Yazmin Locher is a 83 y.o. female with medical history significant for Parkinson's disease, T2DM, HLD, rheumatoid arthritis on methotrexate, PAT, breast cancer s/p right mastectomy who is admitted with right facial cellulitis.  Assessment and Plan: Right facial cellulitis: Involving nose, right cheek, upper lip.  Small lesion with eschar over right nares.  No active discharge.  Afebrile and hemodynamically stable.  Immunosuppressed on methotrexate. - Continue IV vancomycin  and ceftriaxone  - Follow-up blood cultures, no growth in the first 24 hours -Mild improvement in erythema still has significant swelling.  No fevers.   Type 2 diabetes: Placed on SSI.  Currently blood sugars fairly well-controlled.   Parkinson's disease: Continue Sinemet  IR.   Rheumatoid arthritis: Holding methotrexate.      Subjective: Patient was seen and examined at bedside today.  Patient states she feels much better and the redness is almost gone.  She wanted to get discharged.  I offered that I will give her p.o. antibiotics but she will have to sign AGAINST MEDICAL ADVICE given that she has a undergone treatment only for less than 24 hours.  Initially she wanted to do that but subsequently changed her mind and decided to stay.  Physical Exam: Vitals:   04/23/24 0612 04/23/24 0916 04/23/24 1045 04/23/24 1317  BP: 137/62 (!) 137/57 120/65 (!) 148/63  Pulse: 79 89 80 76  Resp: 18 15 18 15   Temp: 98 F (36.7 C) 98.7 F (37.1 C) 98.2 F (36.8 C) 97.8 F (36.6 C)  TempSrc: Oral Oral  Oral  SpO2: 96% 100% 98% 97%  Weight:      Height:       Constitutional: Resting in bed, NAD, calm, comfortable Eyes: EOMI, lids and conjunctivae normal ENMT: Swelling of upper lip.  Speaking in full clear sentences, controlling oral secretions.  No  stridor.  Mucous membranes are moist. Posterior pharynx clear of any exudate or lesions.Normal dentition.  Neck: normal, supple, no masses. Respiratory: clear to auscultation bilaterally, no wheezing, no crackles. Normal respiratory effort. No accessory muscle use.  Cardiovascular: Regular rate and rhythm, no murmurs / rubs / gallops. No extremity edema. 2+ pedal pulses. Abdomen: no tenderness, no masses palpated. Musculoskeletal: no clubbing / cyanosis. No joint deformity upper and lower extremities. Good ROM, no contractures. Normal muscle tone.  Skin: Black skin lesion at right naris without active discharge.  Swelling and erythema of the face involving nose, right cheek, and upper lip.  See pictures below. Neurologic: Sensation intact. Strength 5/5 in all 4.  Psychiatric: Normal judgment and insight. Alert and oriented x 3. Normal mood. Data Reviewed:  CBC CMP reviewed  Family Communication: Alert and oriented  Disposition: Status is: Inpatient Remains inpatient appropriate because: Persistent cellulitis  Planned Discharge Destination: Home    Time spent: 45 minutes  Author: Ellouise SHAUNNA Ra, MD 04/23/2024 1:52 PM  For on call review [email or web address].

## 2024-04-24 ENCOUNTER — Other Ambulatory Visit (HOSPITAL_COMMUNITY): Payer: Self-pay

## 2024-04-24 DIAGNOSIS — L03211 Cellulitis of face: Secondary | ICD-10-CM | POA: Diagnosis not present

## 2024-04-24 LAB — CBC WITH DIFFERENTIAL/PLATELET
Abs Immature Granulocytes: 0.06 K/uL (ref 0.00–0.07)
Basophils Absolute: 0.1 K/uL (ref 0.0–0.1)
Basophils Relative: 1 %
Eosinophils Absolute: 0.2 K/uL (ref 0.0–0.5)
Eosinophils Relative: 2 %
HCT: 29.6 % — ABNORMAL LOW (ref 36.0–46.0)
Hemoglobin: 9.5 g/dL — ABNORMAL LOW (ref 12.0–15.0)
Immature Granulocytes: 1 %
Lymphocytes Relative: 41 %
Lymphs Abs: 3 K/uL (ref 0.7–4.0)
MCH: 30.4 pg (ref 26.0–34.0)
MCHC: 32.1 g/dL (ref 30.0–36.0)
MCV: 94.9 fL (ref 80.0–100.0)
Monocytes Absolute: 0.5 K/uL (ref 0.1–1.0)
Monocytes Relative: 7 %
Neutro Abs: 3.5 K/uL (ref 1.7–7.7)
Neutrophils Relative %: 48 %
Platelets: 228 K/uL (ref 150–400)
RBC: 3.12 MIL/uL — ABNORMAL LOW (ref 3.87–5.11)
RDW: 14.9 % (ref 11.5–15.5)
WBC: 7.4 K/uL (ref 4.0–10.5)
nRBC: 0 % (ref 0.0–0.2)

## 2024-04-24 LAB — COMPREHENSIVE METABOLIC PANEL WITH GFR
ALT: 11 U/L (ref 0–44)
AST: 20 U/L (ref 15–41)
Albumin: 3.7 g/dL (ref 3.5–5.0)
Alkaline Phosphatase: 46 U/L (ref 38–126)
Anion gap: 13 (ref 5–15)
BUN: 13 mg/dL (ref 8–23)
CO2: 22 mmol/L (ref 22–32)
Calcium: 9.4 mg/dL (ref 8.9–10.3)
Chloride: 106 mmol/L (ref 98–111)
Creatinine, Ser: 0.87 mg/dL (ref 0.44–1.00)
GFR, Estimated: >60 mL/min (ref >60)
Glucose, Bld: 182 mg/dL — ABNORMAL HIGH (ref 70–99)
Potassium: 3.7 mmol/L (ref 3.5–5.1)
Sodium: 141 mmol/L (ref 135–145)
Total Bilirubin: 0.5 mg/dL (ref 0.0–1.2)
Total Protein: 6.3 g/dL — ABNORMAL LOW (ref 6.5–8.1)

## 2024-04-24 LAB — GLUCOSE, CAPILLARY
Glucose-Capillary: 113 mg/dL — ABNORMAL HIGH (ref 70–99)
Glucose-Capillary: 144 mg/dL — ABNORMAL HIGH (ref 70–99)

## 2024-04-24 MED ORDER — LACTINEX PO CHEW
1.0000 | CHEWABLE_TABLET | Freq: Three times a day (TID) | ORAL | 0 refills | Status: AC
  Filled 2024-04-24: qty 30, 10d supply, fill #0

## 2024-04-24 NOTE — Progress Notes (Signed)
 AVS reviewed w/ pt's husband  who verbalized an understanding. Discharge med in a secure bag delivered to pt in room by this RN. Probiotic is OTC - husband will purchase OTC or use greek yogurt. Probiotics not available at [facility].

## 2024-04-24 NOTE — Plan of Care (Signed)
  Problem: Education: Goal: Ability to describe self-care measures that may prevent or decrease complications (Diabetes Survival Skills Education) will improve 04/24/2024 1123 by Alaina Dozier PARAS, RN Outcome: Adequate for Discharge 04/24/2024 0857 by Alaina Dozier PARAS, RN Outcome: Not Progressing Goal: Individualized Educational Video(s) 04/24/2024 1123 by Alaina Dozier PARAS, RN Outcome: Adequate for Discharge 04/24/2024 0857 by Alaina Dozier PARAS, RN Outcome: Not Progressing   Problem: Coping: Goal: Ability to adjust to condition or change in health will improve 04/24/2024 1123 by Alaina Dozier PARAS, RN Outcome: Adequate for Discharge 04/24/2024 0857 by Alaina Dozier PARAS, RN Outcome: Progressing   Problem: Fluid Volume: Goal: Ability to maintain a balanced intake and output will improve 04/24/2024 1123 by Alaina Dozier PARAS, RN Outcome: Adequate for Discharge 04/24/2024 0857 by Alaina Dozier PARAS, RN Outcome: Progressing   Problem: Health Behavior/Discharge Planning: Goal: Ability to identify and utilize available resources and services will improve 04/24/2024 1123 by Sharod Petsch, Dozier PARAS, RN Outcome: Adequate for Discharge 04/24/2024 0857 by Alaina Dozier PARAS, RN Outcome: Not Progressing Goal: Ability to manage health-related needs will improve 04/24/2024 1123 by Alaina Dozier PARAS, RN Outcome: Adequate for Discharge 04/24/2024 0857 by Alaina Dozier PARAS, RN Outcome: Not Progressing   Problem: Metabolic: Goal: Ability to maintain appropriate glucose levels will improve Outcome: Adequate for Discharge   Problem: Nutritional: Goal: Maintenance of adequate nutrition will improve Outcome: Adequate for Discharge Goal: Progress toward achieving an optimal weight will improve Outcome: Adequate for Discharge   Problem: Skin Integrity: Goal: Risk for impaired skin integrity will decrease Outcome: Adequate for Discharge   Problem: Tissue  Perfusion: Goal: Adequacy of tissue perfusion will improve Outcome: Adequate for Discharge   Problem: Education: Goal: Knowledge of General Education information will improve Description: Including pain rating scale, medication(s)/side effects and non-pharmacologic comfort measures Outcome: Adequate for Discharge   Problem: Health Behavior/Discharge Planning: Goal: Ability to manage health-related needs will improve Outcome: Adequate for Discharge   Problem: Clinical Measurements: Goal: Ability to maintain clinical measurements within normal limits will improve Outcome: Adequate for Discharge Goal: Will remain free from infection Outcome: Adequate for Discharge Goal: Diagnostic test results will improve Outcome: Adequate for Discharge Goal: Respiratory complications will improve Outcome: Adequate for Discharge Goal: Cardiovascular complication will be avoided Outcome: Adequate for Discharge   Problem: Activity: Goal: Risk for activity intolerance will decrease Outcome: Adequate for Discharge   Problem: Nutrition: Goal: Adequate nutrition will be maintained Outcome: Adequate for Discharge   Problem: Coping: Goal: Level of anxiety will decrease Outcome: Adequate for Discharge   Problem: Elimination: Goal: Will not experience complications related to bowel motility Outcome: Adequate for Discharge Goal: Will not experience complications related to urinary retention Outcome: Adequate for Discharge   Problem: Pain Managment: Goal: General experience of comfort will improve and/or be controlled Outcome: Adequate for Discharge   Problem: Safety: Goal: Ability to remain free from injury will improve Outcome: Adequate for Discharge   Problem: Skin Integrity: Goal: Risk for impaired skin integrity will decrease Outcome: Adequate for Discharge   Problem: Clinical Measurements: Goal: Ability to avoid or minimize complications of infection will improve Outcome: Adequate for  Discharge   Problem: Skin Integrity: Goal: Skin integrity will improve Outcome: Adequate for Discharge

## 2024-04-24 NOTE — Progress Notes (Signed)
 Last night pt became confused and agitated.  She refused to stop wandering the halls.  She said she was looking for her husband and son.  The tech had to stay with her.  She kept trying to leave thru the exit door and was calling family to get her.  She refused to take her medication.  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  Stated she doesn't follow rules.  Husband arrived to stay with her.  Eventually he got her to calm down enough to give her meds.   This am lab reported that pt refused to give blood.  She stated that pt was hitting and kicking at her and husband.  I told her to plan to come back during the day in case it is sundowning or once dr can see her

## 2024-04-24 NOTE — Discharge Summary (Signed)
 Physician Discharge Summary   Patient: Christy [name] MRN: [id number] DOB: [birth year]  Admit date:     04/22/2024  Discharge date: 04/24/24  Discharge Physician: Adriana DELENA Grams   PCP: Cristopher Bottcher, NP   Recommendations at discharge:    Follow-up with PCP in 1 week Monitor your blood sugar closely Continue current antibiotic as recommended  Discharge Diagnoses: Principal Problem:   Facial cellulitis Active Problems:   Rheumatoid arthritis (HCC)   Type 2 diabetes mellitus with diabetic neuropathy, without long-term current use of insulin  (HCC)   Parkinson disease (HCC)   Christy [name] is a 83 y.o. female with medical history significant for Parkinson's disease, T2DM, HLD, rheumatoid arthritis on methotrexate, PAT, breast cancer s/p right mastectomy who is admitted with right facial cellulitis.    Right facial cellulitis: Involving nose, right cheek, upper lip.  Small lesion with eschar over right nares.  No active discharge.  Afebrile and hemodynamically stable.  Immunosuppressed on methotrexate. - Continue IV vancomycin  and ceftriaxone  >>> switched to p.o. antibiotics Zyvox  600 mg p.o. twice daily for 8 more days - Follow-up blood cultures, no growth to date - Much improved facial erythema edema No fevers.   Type 2 diabetes: -Carb modified diabetic diet - Resume home regimen   Parkinson's disease: Continue Sinemet  IR.   Rheumatoid arthritis: Resume methotrexate in 1-2 days     Disposition: Home Diet recommendation:  Discharge Diet Orders (From admission, onward)     Start     Ordered   04/24/24 0000  Diet - low sodium heart healthy        04/24/24 1005   04/23/24 0000  Diet Carb Modified        04/23/24 1304           Carb modified diet DISCHARGE MEDICATION: Allergies as of 04/24/2024       Reactions   Penicillins Hives, Rash   Atorvastatin  Other (See Comments)   Joint pain on 20mg  daily   Epinephrine Other (See Comments)   Patient is unsure of  reaction    Erythromycin Other (See Comments)   Patient is unsure of reaction    Rosuvastatin  Other (See Comments)   Myalgias and joint pain on 5mg  daily   Tetracyclines & Related Other (See Comments)   Patient is unsure of reaction         Medication List     PAUSE taking these medications    carbidopa -levodopa  25-100 MG tablet Wait to take this until: April 30, 2024 Commonly known as: SINEMET  IR Take 1 tablet by mouth 3 (three) times daily.       TAKE these medications    ascorbic acid 500 MG tablet Commonly known as: VITAMIN C Take 500 mg by mouth daily.   Calcium  Citrate 150 MG Caps Take 1,000 mg by mouth daily.   FISH OIL PO Take 1 capsule by mouth daily at 12 noon.   folic acid  1 MG tablet Commonly known as: FOLVITE  Take 1 mg by mouth daily.   lactobacillus acidophilus & bulgar chewable tablet Chew 1 tablet by mouth 3 (three) times daily with meals for 10 days.   linezolid  600 MG tablet Commonly known as: ZYVOX  Take 1 tablet (600 mg total) by mouth 2 (two) times daily for 8 days.   MAGNESIUM PO Take 500 mg by mouth at bedtime.   methotrexate 2.5 MG tablet Commonly known as: RHEUMATREX Take 15 mg by mouth once a week. Monday - Caution:Chemotherapy. Protect from light.   MULTIPLE  VITAMIN PO Take 1 tablet by mouth daily.   Nexlizet  180-10 MG Tabs Generic drug: Bempedoic Acid-Ezetimibe Take 1 tablet by mouth daily.   Probiotic 1-250 BILLION-MG Caps Take 1 tablet by mouth daily for 10 days.   Rybelsus  14 MG Tabs Generic drug: Semaglutide  Take 14 mg by mouth daily.   Vitamin D  1000 units capsule Take 1,000 Units by mouth daily.        Follow-up Information     Cristopher Bottcher, NP Follow up in 3 day(s).   Specialty: Family Medicine Contact information: [name] [place] [address] [phone]                Discharge Exam: [name]   04/22/24 1434  Weight: 65.3 kg        General:  AAO x  3,  cooperative, no distress;   HEENT:  Right facial edema erythema engulfing nasolabial folds much improved, no of any abscess or drainage Normocephalic, PERRL, otherwise with in Normal limits   Neuro:  CNII-XII intact. , normal motor and sensation, reflexes intact   Lungs:   Clear to auscultation BL, Respirations unlabored,  No wheezes / crackles  Cardio:    S1/S2, RRR, No murmure, No Rubs or Gallops   Abdomen:  Soft, non-tender, bowel sounds active all four quadrants, no guarding or peritoneal signs.  Muscular  skeletal:  Limited exam -global generalized weaknesses - in bed, able to move all 4 extremities,   2+ pulses,  symmetric, No pitting edema  Skin:  Dry, warm to touch, negative for any Rashes,  Wounds: Please see nursing documentation         Condition at discharge: good  The results of significant diagnostics from this hospitalization (including imaging, microbiology, ancillary and laboratory) are listed below for reference.   Imaging Studies: CT Maxillofacial W Contrast Result Date: 04/22/2024 EXAM: CT Face with contrast 04/22/2024 05:18:28 PM TECHNIQUE: CT of the face was performed with the administration of intravenous contrast. Multiplanar reformatted images are provided for review. Automated exposure control, iterative reconstruction, and/or weight based adjustment of the mA/kV was utilized to reduce the radiation dose to as low as reasonably achievable. COMPARISON: None available CLINICAL HISTORY: Infection, facial swelling, erythema, and nasal lesion that initially began 1 week ago. FINDINGS: AERODIGESTIVE TRACT: No mass. No edema. SALIVARY GLANDS: No acute abnormality. LYMPH NODES: No suspicious cervical lymphadenopathy. SOFT TISSUES: A cutaneous lesion along the right side of the nose measures 9 x 4 mm. There is some stranding of the subcutaneous tissues. No discrete abscess is present. BRAIN, ORBITS AND SINUSES: Mild mucosal thickening is present within the right  maxillary sinus. BONES: No acute abnormality. No suspicious bone lesion. No underlying osseous lesion or erosion is present. VASCULATURE: Atherosclerotic calcifications are present in the cavernous carotid arteries bilaterally and at the dural margin of both vertebral arteries. No hyperdense vessel is present. IMPRESSION: 1. Cutaneous lesion along the right side of the nose measuring 9 x 4 mm with associated subcutaneous stranding. No deeper abscess or underlying osseous erosion. 2. Mild mucosal thickening in the right maxillary sinus. Electronically signed by: Lonni Necessary MD 04/22/2024 06:46 PM EDT RP Workstation: [id number]   DG Chest Port 1 View Result Date: 04/22/2024 CLINICAL DATA:  Sepsis EXAM: PORTABLE CHEST 1 VIEW COMPARISON:  None Available. FINDINGS: Cardiac and mediastinal margins appear normal. Mildly accentuated retrodiaphragmatic density, cannot exclude left lower lobe atelectasis or pneumonia. The right lung appears clear.  Right axillary clips noted. Bony demineralization  is present. Atherosclerotic calcification of the aortic arch. IMPRESSION: 1. Mildly accentuated retrodiaphragmatic density, cannot exclude left lower lobe atelectasis or pneumonia. Consider lateral chest radiography, if feasible. 2. Bony demineralization. 3. Right axillary clips noted. 4.  Aortic Atherosclerosis (ICD10-I70.0). Electronically Signed   By: Ryan Salvage M.D.   On: 04/22/2024 17:19    Microbiology: Results for orders placed or performed during the hospital encounter of 04/22/24  Blood Culture (routine x 2)     Status: None (Preliminary result)   Collection Time: 04/22/24  2:41 PM   Specimen: BLOOD LEFT ARM  Result Value Ref Range Status   Specimen Description   Final    BLOOD LEFT ARM Performed at [facility], [address] [place], [place] [address]    Special Requests   Final    BOTTLES DRAWN AEROBIC AND ANAEROBIC Blood Culture results may not be optimal due to an inadequate  volume of blood received in culture bottles Performed at [facility], [address], [place], [place] [address]    Culture   Final    NO GROWTH 2 DAYS Performed at [facility], [address] [place], [place] [address]    Report Status PENDING  Incomplete  Resp panel by RT-PCR (RSV, Flu A&B, Covid) Anterior Nasal Swab     Status: None   Collection Time: 04/22/24  4:05 PM   Specimen: Anterior Nasal Swab  Result Value Ref Range Status   SARS Coronavirus 2 by RT PCR NEGATIVE NEGATIVE Final    Comment: (NOTE) SARS-CoV-2 target nucleic acids are NOT DETECTED.  The SARS-CoV-2 RNA is generally detectable in upper respiratory specimens during the acute phase of infection. The lowest concentration of SARS-CoV-2 viral copies this assay can detect is 138 copies/mL. A negative result does not preclude SARS-Cov-2 infection and should not be used as the sole basis for treatment or other patient management decisions. A negative result may occur with  improper specimen collection/handling, submission of specimen other than nasopharyngeal swab, presence of viral mutation(s) within the areas targeted by this assay, and inadequate number of viral copies(<138 copies/mL). A negative result must be combined with clinical observations, patient history, and epidemiological information. The expected result is Negative.  Fact Sheet for Patients:  BloggerCourse.com  Fact Sheet for Healthcare Providers:  SeriousBroker.it  This test is no t yet approved or cleared by the [place]  FDA and  has been authorized for detection and/or diagnosis of SARS-CoV-2 by FDA under an Emergency Use Authorization (EUA). This EUA will remain  in effect (meaning this test can be used) for the duration of the COVID-19 declaration under Section 564(b)(1) of the Act, 21 U.S.C.section 360bbb-3(b)(1), unless the authorization is terminated  or  revoked sooner.       Influenza A by PCR NEGATIVE NEGATIVE Final   Influenza B by PCR NEGATIVE NEGATIVE Final    Comment: (NOTE) The Xpert Xpress SARS-CoV-2/FLU/RSV plus assay is intended as an aid in the diagnosis of influenza from Nasopharyngeal swab specimens and should not be used as a sole basis for treatment. Nasal washings and aspirates are unacceptable for Xpert Xpress SARS-CoV-2/FLU/RSV testing.  Fact Sheet for Patients: BloggerCourse.com  Fact Sheet for Healthcare Providers: SeriousBroker.it  This test is not yet approved or cleared by the [place]  FDA and has been authorized for detection and/or diagnosis of SARS-CoV-2 by FDA under an Emergency Use Authorization (EUA). This EUA will remain in effect (meaning this test can be used) for the duration  of the COVID-19 declaration under Section 564(b)(1) of the Act, 21 U.S.C. section 360bbb-3(b)(1), unless the authorization is terminated or revoked.     Resp Syncytial Virus by PCR NEGATIVE NEGATIVE Final    Comment: (NOTE) Fact Sheet for Patients: BloggerCourse.com  Fact Sheet for Healthcare Providers: SeriousBroker.it  This test is not yet approved or cleared by the [place]  FDA and has been authorized for detection and/or diagnosis of SARS-CoV-2 by FDA under an Emergency Use Authorization (EUA). This EUA will remain in effect (meaning this test can be used) for the duration of the COVID-19 declaration under Section 564(b)(1) of the Act, 21 U.S.C. section 360bbb-3(b)(1), unless the authorization is terminated or revoked.  Performed at [facility], [address], [place], [place] [address]   Blood Culture (routine x 2)     Status: None (Preliminary result)   Collection Time: 04/22/24  4:05 PM   Specimen: BLOOD  Result Value Ref Range Status   Specimen Description   Final    BLOOD  RIGHT ANTECUBITAL Performed at [facility], [address] [place], [place] [address]    Special Requests   Final    BOTTLES DRAWN AEROBIC AND ANAEROBIC Blood Culture adequate volume Performed at [facility], [address] [place], [place] [address]    Culture   Final    NO GROWTH 2 DAYS Performed at [facility], [address], [place], [place] [address]    Report Status PENDING  Incomplete    Labs: CBC: Recent Labs  Lab 04/22/24 1441 04/23/24 0408 04/24/24 0842  WBC 5.0 4.6 7.4  NEUTROABS 3.8  --  3.5  HGB 10.1* 9.5* 9.5*  HCT 30.1* 28.9* 29.6*  MCV 93.8 96.0 94.9  PLT 219 197 228   Basic Metabolic Panel: Recent Labs  Lab 04/22/24 1441 04/23/24 0408 04/24/24 0842  NA 134* 141 141  K 3.6 3.2* 3.7  CL 101 108 106  CO2 19* 21* 22  GLUCOSE 198* 125* 182*  BUN 17 13 13   CREATININE 1.09* 0.84 0.87  CALCIUM  9.8 9.2 9.4   Liver Function Tests: Recent Labs  Lab 04/22/24 1441 04/24/24 0842  AST 17 20  ALT 9 11  ALKPHOS 52 46  BILITOT 0.7 0.5  PROT 7.1 6.3*  ALBUMIN 4.0 3.7   CBG: Recent Labs  Lab 04/23/24 0719 04/23/24 1122 04/23/24 1643 04/23/24 2108 04/24/24 0734  GLUCAP 120* 206* 122* 171* 113*    Discharge time spent: greater than 30 minutes.  Signed: Adriana DELENA Grams, MD [facility] 04/24/2024

## 2024-04-24 NOTE — Plan of Care (Signed)
  Problem: Coping: Goal: Ability to adjust to condition or change in health will improve Outcome: Progressing   Problem: Fluid Volume: Goal: Ability to maintain a balanced intake and output will improve Outcome: Progressing   Problem: Education: Goal: Ability to describe self-care measures that may prevent or decrease complications (Diabetes Survival Skills Education) will improve Outcome: Not Progressing Goal: Individualized Educational Video(s) Outcome: Not Progressing   Problem: Health Behavior/Discharge Planning: Goal: Ability to identify and utilize available resources and services will improve Outcome: Not Progressing Goal: Ability to manage health-related needs will improve Outcome: Not Progressing

## 2024-04-24 NOTE — Plan of Care (Signed)
  Problem: Coping: Goal: Ability to adjust to condition or change in health will improve Outcome: Not Progressing   Problem: Coping: Goal: Level of anxiety will decrease Outcome: Not Progressing   Problem: Pain Managment: Goal: General experience of comfort will improve and/or be controlled Outcome: Not Progressing   Problem: Safety: Goal: Non-violent Restraint(s) Outcome: Not Applicable

## 2024-04-25 LAB — PATHOLOGIST SMEAR REVIEW

## 2024-04-27 DIAGNOSIS — L03211 Cellulitis of face: Secondary | ICD-10-CM | POA: Diagnosis not present

## 2024-04-27 DIAGNOSIS — E1140 Type 2 diabetes mellitus with diabetic neuropathy, unspecified: Secondary | ICD-10-CM | POA: Diagnosis not present

## 2024-04-27 LAB — CULTURE, BLOOD (ROUTINE X 2)
Culture: NO GROWTH
Culture: NO GROWTH
Special Requests: ADEQUATE

## 2024-05-02 ENCOUNTER — Ambulatory Visit: Admitting: Psychiatry

## 2024-05-15 DIAGNOSIS — R748 Abnormal levels of other serum enzymes: Secondary | ICD-10-CM | POA: Diagnosis not present

## 2024-05-24 DIAGNOSIS — M25511 Pain in right shoulder: Secondary | ICD-10-CM | POA: Diagnosis not present

## 2024-05-24 DIAGNOSIS — Z6825 Body mass index (BMI) 25.0-25.9, adult: Secondary | ICD-10-CM | POA: Diagnosis not present

## 2024-05-24 DIAGNOSIS — R5383 Other fatigue: Secondary | ICD-10-CM | POA: Diagnosis not present

## 2024-05-24 DIAGNOSIS — E559 Vitamin D deficiency, unspecified: Secondary | ICD-10-CM | POA: Diagnosis not present

## 2024-05-24 DIAGNOSIS — M0579 Rheumatoid arthritis with rheumatoid factor of multiple sites without organ or systems involvement: Secondary | ICD-10-CM | POA: Diagnosis not present

## 2024-05-24 DIAGNOSIS — M1991 Primary osteoarthritis, unspecified site: Secondary | ICD-10-CM | POA: Diagnosis not present

## 2024-05-24 DIAGNOSIS — E663 Overweight: Secondary | ICD-10-CM | POA: Diagnosis not present

## 2024-05-30 ENCOUNTER — Ambulatory Visit: Admitting: Psychiatry

## 2024-06-11 ENCOUNTER — Encounter: Payer: Self-pay | Admitting: Radiology

## 2024-06-26 DIAGNOSIS — R413 Other amnesia: Secondary | ICD-10-CM | POA: Diagnosis not present

## 2024-06-26 DIAGNOSIS — G20C Parkinsonism, unspecified: Secondary | ICD-10-CM | POA: Diagnosis not present

## 2024-06-26 DIAGNOSIS — I700 Atherosclerosis of aorta: Secondary | ICD-10-CM | POA: Diagnosis not present

## 2024-06-26 DIAGNOSIS — M069 Rheumatoid arthritis, unspecified: Secondary | ICD-10-CM | POA: Diagnosis not present

## 2024-06-26 DIAGNOSIS — F418 Other specified anxiety disorders: Secondary | ICD-10-CM | POA: Diagnosis not present

## 2024-06-26 DIAGNOSIS — Z853 Personal history of malignant neoplasm of breast: Secondary | ICD-10-CM | POA: Diagnosis not present

## 2024-06-26 DIAGNOSIS — R931 Abnormal findings on diagnostic imaging of heart and coronary circulation: Secondary | ICD-10-CM | POA: Diagnosis not present

## 2024-06-26 DIAGNOSIS — E1140 Type 2 diabetes mellitus with diabetic neuropathy, unspecified: Secondary | ICD-10-CM | POA: Diagnosis not present

## 2024-06-26 DIAGNOSIS — D649 Anemia, unspecified: Secondary | ICD-10-CM | POA: Diagnosis not present

## 2024-06-26 DIAGNOSIS — E785 Hyperlipidemia, unspecified: Secondary | ICD-10-CM | POA: Diagnosis not present

## 2024-06-26 DIAGNOSIS — I4719 Other supraventricular tachycardia: Secondary | ICD-10-CM | POA: Diagnosis not present

## 2024-07-04 DIAGNOSIS — Z1231 Encounter for screening mammogram for malignant neoplasm of breast: Secondary | ICD-10-CM | POA: Diagnosis not present

## 2024-07-24 ENCOUNTER — Telehealth: Payer: Self-pay | Admitting: Occupational Therapy

## 2024-07-24 ENCOUNTER — Encounter: Payer: Self-pay | Admitting: Occupational Therapy

## 2024-07-24 ENCOUNTER — Ambulatory Visit: Attending: Family Medicine | Admitting: Occupational Therapy

## 2024-07-24 DIAGNOSIS — R29818 Other symptoms and signs involving the nervous system: Secondary | ICD-10-CM | POA: Diagnosis present

## 2024-07-24 DIAGNOSIS — R41840 Attention and concentration deficit: Secondary | ICD-10-CM | POA: Insufficient documentation

## 2024-07-24 DIAGNOSIS — R29898 Other symptoms and signs involving the musculoskeletal system: Secondary | ICD-10-CM | POA: Insufficient documentation

## 2024-07-24 DIAGNOSIS — M6281 Muscle weakness (generalized): Secondary | ICD-10-CM

## 2024-07-24 DIAGNOSIS — R41844 Frontal lobe and executive function deficit: Secondary | ICD-10-CM | POA: Diagnosis present

## 2024-07-24 DIAGNOSIS — G20C Parkinsonism, unspecified: Secondary | ICD-10-CM

## 2024-07-24 NOTE — Telephone Encounter (Signed)
 Referral placed to neurorehab, PT and ST as recommended.

## 2024-07-24 NOTE — Therapy (Signed)
 OUTPATIENT OCCUPATIONAL THERAPY PARKINSON'S EVALUATION  Patient Name: Christy [name] MRN: [id number] DOB:01-26-1941, 83 y.o., female Today's Date: 07/24/2024  PCP: Cristopher Bottcher, NP  REFERRING PROVIDER: Cristopher Bottcher, NP  END OF SESSION:  OT End of Session - 07/24/24 1456     Visit Number 1    Number of Visits 21    Date for Recertification  10/05/24    Authorization Type [employer] - requires auth    OT Start Time 1452    OT Stop Time 1541    OT Time Calculation (min) 49 min    Activity Tolerance Patient tolerated treatment well    Behavior During Therapy [facility] for tasks assessed/performed          Past Medical History:  Diagnosis Date   Agatston coronary artery calcium  score greater than 400    Ca score 1121 in July 2022   Allergy    Arthritis    Chronic constipation    Depression    Diabetes mellitus without complication (HCC)    History of breast cancer    HLD (hyperlipidemia)    Parkinson disease (HCC)    PAT (paroxysmal atrial tachycardia)    [place] 02/2021   Pulmonary insufficiency    moderate by echo 11/23   Rheumatoid arthritis (HCC)    Past Surgical History:  Procedure Laterality Date   ABDOMINAL HYSTERECTOMY     ACHILLES TENDON REPAIR     BREAST REDUCTION SURGERY Left    CATARACT EXTRACTION     masectomy Right    TONSILLECTOMY AND ADENOIDECTOMY     Patient Active Problem List   Diagnosis Date Noted   Facial cellulitis 04/22/2024   Parkinson disease (HCC)    Lip swelling 09/28/2022   Cognitive decline 09/02/2022   Insomnia 09/02/2022   Hearing loss 09/02/2022   Acute pain of left knee 05/10/2022   Rheumatoid arthritis (HCC) 03/31/2022   Type 2 diabetes mellitus with diabetic neuropathy, without long-term current use of insulin  (HCC) 03/31/2022   Anxiety and depression 03/31/2022   Diarrhea of presumed infectious origin 03/31/2022   History of breast cancer 03/31/2022   Pulmonary insufficiency 05/13/2021   PAT (paroxysmal atrial  tachycardia) 04/14/2021   Agatston coronary artery calcium  score greater than 400 04/14/2021   HLD (hyperlipidemia) 04/14/2021    ONSET DATE: 06/27/2024 (Date of referral) Diagnosed with PD earlier in 2025  REFERRING DIAG: G20.C (ICD-10-CM) - Parkinsonism, unspecified  THERAPY DIAG:  Muscle weakness (generalized)  Other symptoms and signs involving the musculoskeletal system  Other symptoms and signs involving the nervous system  Attention and concentration deficit  Frontal lobe and executive function deficit  Rationale for Evaluation and Treatment: Rehabilitation  SUBJECTIVE:   SUBJECTIVE STATEMENT: Wears OTC readers and likes to read though prefers large print. Has occasional dizzy spells lasting ~10 seconds.   Pt accompanied by: self - husband waits in the lobby  PERTINENT HISTORY: PMH: cataract extraction, HLD, PD, PAT, RA, depression, anxiety, DM, hx of breast cancer, hearing loss, cognitive decline, and fall Difficulty with ADL's due to Parkinson's disease  PRECAUTIONS: Fall  WEIGHT BEARING RESTRICTIONS: No  PAIN:  Are you having pain? No  FALLS: Has patient fallen in last 6 months? Yes. Number of falls 1 in the last 3 months following dizzy spell  LIVING ENVIRONMENT: Lives with: lives with their spouse Lives in: House/apartment Stairs: Yes: External: 1 steps; none Has following equipment at home: Quad cane large base, Walker - 2 wheeled, and built-in seat  PLOF: Independent; drives short,  familiar distances only; retired public relations account executive at [employer]; enjoys reading, cooking  PATIENT GOALS: improve ADL and IADL performance, reduce caregiver dependence  OBJECTIVE:  Note: Objective measures were completed at Evaluation unless otherwise noted.  HAND DOMINANCE: Right  ADLs: Overall ADLs: mod I with increased time  Eating: setup for cutting UB/LB Dressing: avoids certain types of clothing  Bathing: husband washes her hair in the kitchen  sink  IADLs: Shopping: goes with husband Light housekeeping: has been relying on husband more Meal Prep: has been relying on husband more Community mobility: short distance driving Medication management: mod I Financial management: mod I Handwriting: Increased time, Moderate micrographia, and hard to read  MOBILITY STATUS: Hx of falls  POSTURE COMMENTS:  rounded shoulders and forward head  ACTIVITY TOLERANCE: Activity tolerance: fair  FUNCTIONAL OUTCOME MEASURES: Fastening/unfastening 3 buttons: 109 seconds Physical performance test: PPT#2 (simulated eating) 16 seconds & PPT#4 (donning/doffing jacket): over 20 seconds  COORDINATION: 9 Hole Peg test: Right: 64 sec; Left: 44 sec Box and Blocks:  Right 37 blocks, Left 32 blocks  UE ROM:  WFL; impaired opposition on R side with ulnar drifting  UE MMT:   Not tested  SENSATION: Occasional paresthesias reported B  MUSCLE TONE: Mild RUE tone  COGNITION: Overall cognitive status: Impaired  OBSERVATIONS: Bradykinesia                                                                                                                    TREATMENT:    OT educated pt on rehabilitation process and results of objective measures in relation to pt specific goals. OT educated her on therapy for PD including a multi-disciplinary team approach. OT recommending ST and PT referrals to assess memory, gait, and balance.   PATIENT EDUCATION: Education details: OT role and POC Person educated: Patient Education method: Explanation Education comprehension: verbalized understanding  HOME EXERCISE PROGRAM: N/A for this visit  GOALS:  SHORT TERM GOALS: Target date: 08/21/2024    Pt will be independent with PD specific HEP.  Baseline: not yet initiated Goal status: INITIAL  2.  Pt will verbalize understanding of adapted strategies to maximize safety and independence with ADLs/IADLs.  Baseline: not yet initiated Goal status: INITIAL  3.   Pt will write a sentence with no significant decrease in size and maintain 75% legibility.  Baseline: Pt reports poor legibility with moderate micrographia Goal status: INITIAL  4.  Pt will demonstrate improved fine motor coordination for ADLs as evidenced by decreasing 9 hole peg test score for each hand by at least 5 seconds.  Baseline: Right: 64 sec; Left: 44 sec Goal status: INITIAL  5.  Pt will demonstrate improved ease with fastening buttons as evidenced by decreasing 3 button/unbutton time by at least 10 seconds.  Baseline: 109 seconds Goal status: INITIAL  6.  Pt will independently recall at least 3 joint protection, ergonomics, and body mechanic principles as noted in pt instructions.   Baseline:  Goal status: INITIAL   LONG TERM GOALS: Target  date: 10/05/2024   Pt will verbalize understanding of ways to prevent future PD related complications and PD community resources.  Baseline: not yet initiated Goal status: INITIAL  2.  Pt will write a short paragraph with no significant decrease in size and maintain 100% legibility.  Baseline: Pt reports poor legibility with moderate micrographia Goal status: INITIAL  3.  Pt will verbalize understanding of ways to keep thinking skills sharp and ways to compensate for STM changes in the future.  Baseline: not yet initiated Goal status: INITIAL  4.  Pt will demonstrate improved ease with feeding as evidenced by decreasing PPT#2 by at least 4 seconds.  Baseline: 16 seconds Goal status: INITIAL  5.  Pt will demonstrate increased ease with dressing as evidenced by decreasing PPT#4 (don/ doff jacket) to 15 secs or less.  Baseline: over 20 seconds Goal status: INITIAL  6.  Pt will demonstrate improved fine motor coordination for ADLs as evidenced by decreasing 9 hole peg test score for R hand by at least 10 secs  Baseline: Right: 64 sec; Left: 44 sec Goal status: INITIAL   7.  Pt will demonstrate improved ease with  fastening buttons as evidenced by decreasing 3 button/unbutton time by at least 15 seconds  Baseline: 109 seconds Goal status: INITIAL  8.  Pt will be able to place at least 5 additional blocks each hand with completion of Box and Blocks test.  Baseline: Right 37 blocks, Left 32 blocks Goal status: INITIAL   ASSESSMENT:  CLINICAL IMPRESSION: Patient is a 83 y.o. female who was seen today for occupational therapy evaluation for PD management. Hx includes cataract extraction, HLD, PD, PAT, RA, depression, anxiety, DM, hx of breast cancer, hearing loss, cognitive decline, and fall. Patient presents to clinic seeking skilled therapy services to better manage occupational barriers secondary to Parkinson's and improve independence and safety with ADLs and IADLs.    PERFORMANCE DEFICITS: in functional skills including ADLs, IADLs, coordination, dexterity, proprioception, sensation, tone, ROM, strength, pain, Fine motor control, Gross motor control, hearing, mobility, balance, body mechanics, endurance, decreased knowledge of precautions, decreased knowledge of use of DME, and UE functional use, cognitive skills including attention, memory, perception, problem solving, sequencing, and understand, and psychosocial skills including coping strategies, environmental adaptation, habits, and routines and behaviors.   IMPAIRMENTS: are limiting patient from ADLs, IADLs, rest and sleep, leisure, and social participation.   COMORBIDITIES:  may have co-morbidities  that affects occupational performance. Patient will benefit from skilled OT to address above impairments and improve overall function.  MODIFICATION OR ASSISTANCE TO COMPLETE EVALUATION: Min-Moderate modification of tasks or assist with assess necessary to complete an evaluation.  OT OCCUPATIONAL PROFILE AND HISTORY: Detailed assessment: Review of records and additional review of physical, cognitive, psychosocial history related to current functional  performance.  CLINICAL DECISION MAKING: Moderate - several treatment options, min-mod task modification necessary  REHAB POTENTIAL: Fair given PD and RA diagnoses  EVALUATION COMPLEXITY: Moderate    PLAN:  OT FREQUENCY: 2x/week  OT DURATION: 10 weeks  PLANNED INTERVENTIONS: 97168 OT Re-evaluation, 97535 self care/ADL training, 02889 therapeutic exercise, 97530 therapeutic activity, 97112 neuromuscular re-education, 97140 manual therapy, [id number] aquatic therapy, 97035 ultrasound, 97018 paraffin, 02960 fluidotherapy, 97034 contrast bath, 97750 Physical Performance Testing, 02239 Orthotic Initial, [id number] Orthotic/Prosthetic subsequent, functional mobility training, energy conservation, coping strategies training, patient/family education, and DME and/or AE instructions  RECOMMENDED OTHER SERVICES: PT and ST  CONSULTED AND AGREED WITH PLAN OF CARE: Patient  PLAN FOR NEXT SESSION: Initiate  PWR! Hands; joint protection  Brace for R hand - ulnar drifting  Jocelyn CHRISTELLA Bottom, OT 07/24/2024, 4:13 PM

## 2024-07-24 NOTE — Telephone Encounter (Signed)
 Dr. Buck,  Niels Sable  was evaluated by OT on 07/24/2024.  The patient would benefit from PT and ST evaluation for assessment of gait, balance, and memory to provide her multi-disciplinary, PD care.    If you agree, please place an order in [facility] workque in [facility] or fax the order to [phone].  Thank you,  Jocelyn Bottom, OTR/L 07/24/2024 Occupational Therapy Outpatient Neurorehabilitation [facility] [address] [place], [place]  [address] Phone:  [phone] Fax:  [phone]

## 2024-08-07 ENCOUNTER — Ambulatory Visit: Admitting: Occupational Therapy

## 2024-08-14 ENCOUNTER — Ambulatory Visit: Admitting: Occupational Therapy

## 2024-08-15 ENCOUNTER — Telehealth: Payer: Self-pay | Admitting: Occupational Therapy

## 2024-08-15 NOTE — Telephone Encounter (Signed)
 This is to document my attempt to call patient after she had called earlier to request hold for therapy until February.     Primary phone number(s) was used in efforts to contact the patient.   Voice mail was left requesting the patient call the clinic back at [phone] to confirm receipt of message. OT cancelled January visits at pt request.

## 2024-08-16 ENCOUNTER — Ambulatory Visit: Admitting: Occupational Therapy

## 2024-08-20 ENCOUNTER — Telehealth: Payer: Self-pay | Admitting: Neurology

## 2024-08-20 NOTE — Telephone Encounter (Signed)
 Patient left a voicemail on the phone stating she wanted to r/s her SS.  Her authorization we had on file has expired. NPSG pending a new PA through [facility].

## 2024-08-21 ENCOUNTER — Ambulatory Visit: Admitting: Occupational Therapy

## 2024-08-23 ENCOUNTER — Ambulatory Visit: Admitting: Occupational Therapy

## 2024-08-23 NOTE — Telephone Encounter (Signed)
 NPSG Christy [name]: [id number] (exp. 08/20/24 to 11/17/24)

## 2024-08-28 ENCOUNTER — Ambulatory Visit: Admitting: Occupational Therapy

## 2024-08-28 NOTE — Telephone Encounter (Signed)
 NPSG Mylene barrows: [id number] (exp. 08/20/24 to 11/17/24) & [employer] no auth req   Patient is scheduled at [facility] for 09/09/24 at 8 pm.  Mailed packet and sent mychart

## 2024-08-30 ENCOUNTER — Ambulatory Visit: Admitting: Occupational Therapy

## 2024-09-04 ENCOUNTER — Ambulatory Visit: Admitting: Occupational Therapy

## 2024-09-07 ENCOUNTER — Ambulatory Visit: Admitting: Occupational Therapy

## 2024-09-07 NOTE — Telephone Encounter (Signed)
 I called the patient to r/s her SS due to the sleep lab is closed for 09/09/24 due to the weather.  She did not pick up. I left my direct number for her to call back to r/s.

## 2024-09-09 ENCOUNTER — Encounter

## 2024-09-11 ENCOUNTER — Ambulatory Visit: Admitting: Occupational Therapy

## 2024-09-11 NOTE — Telephone Encounter (Signed)
 I spoke with the patient to res her SS.  She is r/s for 10/07/24 at 9 pm.  Sent mychart and mailed new packet

## 2024-09-13 ENCOUNTER — Ambulatory Visit: Admitting: Occupational Therapy

## 2024-09-18 ENCOUNTER — Ambulatory Visit: Admitting: Occupational Therapy

## 2024-09-21 ENCOUNTER — Ambulatory Visit: Admitting: Occupational Therapy

## 2024-09-25 ENCOUNTER — Ambulatory Visit: Admitting: Occupational Therapy

## 2024-09-27 ENCOUNTER — Ambulatory Visit: Admitting: Occupational Therapy

## 2024-10-02 ENCOUNTER — Ambulatory Visit: Admitting: Occupational Therapy

## 2024-10-04 ENCOUNTER — Ambulatory Visit: Admitting: Occupational Therapy

## 2024-10-07 ENCOUNTER — Encounter
